# Patient Record
Sex: Female | Born: 1935
Health system: Southern US, Community
[De-identification: ages and names within clinical notes are randomized; demographics above are authoritative.]

## PROBLEM LIST (undated history)

## (undated) DIAGNOSIS — J439 Emphysema, unspecified: Secondary | ICD-10-CM

## (undated) DIAGNOSIS — Z9981 Dependence on supplemental oxygen: Secondary | ICD-10-CM

## (undated) DIAGNOSIS — E785 Hyperlipidemia, unspecified: Secondary | ICD-10-CM

## (undated) DIAGNOSIS — T7840XA Allergy, unspecified, initial encounter: Secondary | ICD-10-CM

## (undated) DIAGNOSIS — I10 Essential (primary) hypertension: Secondary | ICD-10-CM

## (undated) DIAGNOSIS — M810 Age-related osteoporosis without current pathological fracture: Secondary | ICD-10-CM

## (undated) DIAGNOSIS — H269 Unspecified cataract: Secondary | ICD-10-CM

## (undated) DIAGNOSIS — K219 Gastro-esophageal reflux disease without esophagitis: Secondary | ICD-10-CM

## (undated) HISTORY — DX: Essential (primary) hypertension: I10

## (undated) HISTORY — DX: Allergy, unspecified, initial encounter: T78.40XA

## (undated) HISTORY — DX: Hyperlipidemia, unspecified: E78.5

## (undated) HISTORY — DX: Emphysema, unspecified: J43.9

## (undated) HISTORY — DX: Gastro-esophageal reflux disease without esophagitis: K21.9

## (undated) HISTORY — DX: Unspecified cataract: H26.9

## (undated) HISTORY — PX: NO PAST SURGERIES: SHX2092

## (undated) HISTORY — DX: Dependence on supplemental oxygen: Z99.81

## (undated) HISTORY — DX: Age-related osteoporosis without current pathological fracture: M81.0

---

## 2014-11-14 LAB — VITAMIN D 25 HYDROXY (VIT D DEFICIENCY, FRACTURES): Vit D, 25-Hydroxy: 72

## 2015-12-22 DIAGNOSIS — Z822 Family history of deafness and hearing loss: Secondary | ICD-10-CM | POA: Diagnosis not present

## 2015-12-22 DIAGNOSIS — K219 Gastro-esophageal reflux disease without esophagitis: Secondary | ICD-10-CM | POA: Diagnosis not present

## 2015-12-22 DIAGNOSIS — I10 Essential (primary) hypertension: Secondary | ICD-10-CM | POA: Diagnosis not present

## 2015-12-22 DIAGNOSIS — R262 Difficulty in walking, not elsewhere classified: Secondary | ICD-10-CM | POA: Diagnosis not present

## 2015-12-22 DIAGNOSIS — J449 Chronic obstructive pulmonary disease, unspecified: Secondary | ICD-10-CM | POA: Diagnosis not present

## 2015-12-22 DIAGNOSIS — J302 Other seasonal allergic rhinitis: Secondary | ICD-10-CM | POA: Diagnosis not present

## 2015-12-22 DIAGNOSIS — R5381 Other malaise: Secondary | ICD-10-CM | POA: Diagnosis not present

## 2016-01-08 DIAGNOSIS — H900 Conductive hearing loss, bilateral: Secondary | ICD-10-CM | POA: Diagnosis not present

## 2016-01-08 DIAGNOSIS — H6123 Impacted cerumen, bilateral: Secondary | ICD-10-CM | POA: Diagnosis not present

## 2016-05-24 DIAGNOSIS — E785 Hyperlipidemia, unspecified: Secondary | ICD-10-CM | POA: Diagnosis not present

## 2016-05-24 DIAGNOSIS — I10 Essential (primary) hypertension: Secondary | ICD-10-CM | POA: Diagnosis not present

## 2016-05-24 DIAGNOSIS — Z79899 Other long term (current) drug therapy: Secondary | ICD-10-CM | POA: Diagnosis not present

## 2016-05-24 LAB — CBC AND DIFFERENTIAL
HCT: 43 (ref 36–46)
Hemoglobin: 14 (ref 12.0–16.0)
Neutrophils Absolute: 6860
Platelets: 311 (ref 150–399)
WBC: 9.8

## 2016-05-24 LAB — LIPID PANEL
Cholesterol: 204 — AB (ref 0–200)
HDL: 138 — AB (ref 35–70)
LDL Cholesterol: 51
Triglycerides: 76 (ref 40–160)

## 2016-05-24 LAB — HEPATIC FUNCTION PANEL
ALT: 32 (ref 7–35)
AST: 37 — AB (ref 13–35)
Alkaline Phosphatase: 81 (ref 25–125)
Bilirubin, Total: 0.8

## 2016-05-24 LAB — BASIC METABOLIC PANEL
BUN: 10 (ref 4–21)
Creatinine: 0.6 (ref 0.5–1.1)
Glucose: 100
Potassium: 4.6 (ref 3.4–5.3)
Sodium: 136 — AB (ref 137–147)

## 2016-09-27 DIAGNOSIS — K219 Gastro-esophageal reflux disease without esophagitis: Secondary | ICD-10-CM | POA: Diagnosis not present

## 2016-09-27 DIAGNOSIS — R262 Difficulty in walking, not elsewhere classified: Secondary | ICD-10-CM | POA: Diagnosis not present

## 2016-09-27 DIAGNOSIS — Z822 Family history of deafness and hearing loss: Secondary | ICD-10-CM | POA: Diagnosis not present

## 2016-09-27 DIAGNOSIS — J302 Other seasonal allergic rhinitis: Secondary | ICD-10-CM | POA: Diagnosis not present

## 2016-09-27 DIAGNOSIS — J449 Chronic obstructive pulmonary disease, unspecified: Secondary | ICD-10-CM | POA: Diagnosis not present

## 2016-09-27 DIAGNOSIS — I10 Essential (primary) hypertension: Secondary | ICD-10-CM | POA: Diagnosis not present

## 2016-09-27 DIAGNOSIS — Z23 Encounter for immunization: Secondary | ICD-10-CM | POA: Diagnosis not present

## 2016-09-27 DIAGNOSIS — R5381 Other malaise: Secondary | ICD-10-CM | POA: Diagnosis not present

## 2016-11-25 DIAGNOSIS — M810 Age-related osteoporosis without current pathological fracture: Secondary | ICD-10-CM | POA: Diagnosis not present

## 2016-11-25 DIAGNOSIS — I10 Essential (primary) hypertension: Secondary | ICD-10-CM | POA: Diagnosis not present

## 2016-11-25 DIAGNOSIS — E785 Hyperlipidemia, unspecified: Secondary | ICD-10-CM | POA: Diagnosis not present

## 2016-11-25 DIAGNOSIS — Z79899 Other long term (current) drug therapy: Secondary | ICD-10-CM | POA: Diagnosis not present

## 2016-11-27 DIAGNOSIS — J449 Chronic obstructive pulmonary disease, unspecified: Secondary | ICD-10-CM | POA: Diagnosis not present

## 2016-11-27 DIAGNOSIS — Z87891 Personal history of nicotine dependence: Secondary | ICD-10-CM | POA: Diagnosis not present

## 2016-11-27 DIAGNOSIS — Z79899 Other long term (current) drug therapy: Secondary | ICD-10-CM | POA: Diagnosis not present

## 2016-11-27 DIAGNOSIS — S61200A Unspecified open wound of right index finger without damage to nail, initial encounter: Secondary | ICD-10-CM | POA: Diagnosis not present

## 2016-11-27 DIAGNOSIS — M25562 Pain in left knee: Secondary | ICD-10-CM | POA: Diagnosis not present

## 2016-11-27 DIAGNOSIS — I10 Essential (primary) hypertension: Secondary | ICD-10-CM | POA: Diagnosis not present

## 2016-11-27 DIAGNOSIS — Z9981 Dependence on supplemental oxygen: Secondary | ICD-10-CM | POA: Diagnosis not present

## 2016-11-27 DIAGNOSIS — M25551 Pain in right hip: Secondary | ICD-10-CM | POA: Diagnosis not present

## 2016-11-27 DIAGNOSIS — W19XXXA Unspecified fall, initial encounter: Secondary | ICD-10-CM | POA: Diagnosis not present

## 2016-11-27 DIAGNOSIS — M25561 Pain in right knee: Secondary | ICD-10-CM | POA: Diagnosis not present

## 2017-05-16 DIAGNOSIS — I10 Essential (primary) hypertension: Secondary | ICD-10-CM | POA: Diagnosis not present

## 2017-05-16 DIAGNOSIS — E785 Hyperlipidemia, unspecified: Secondary | ICD-10-CM | POA: Diagnosis not present

## 2017-05-16 DIAGNOSIS — Z79899 Other long term (current) drug therapy: Secondary | ICD-10-CM | POA: Diagnosis not present

## 2017-06-27 ENCOUNTER — Ambulatory Visit (INDEPENDENT_AMBULATORY_CARE_PROVIDER_SITE_OTHER): Payer: Medicare Other | Admitting: Family Medicine

## 2017-06-27 ENCOUNTER — Encounter: Payer: Self-pay | Admitting: Family Medicine

## 2017-06-27 VITALS — BP 110/67 | HR 99 | Temp 98.0°F | Resp 20 | Ht 64.5 in | Wt 96.8 lb

## 2017-06-27 DIAGNOSIS — I10 Essential (primary) hypertension: Secondary | ICD-10-CM | POA: Insufficient documentation

## 2017-06-27 DIAGNOSIS — J439 Emphysema, unspecified: Secondary | ICD-10-CM | POA: Insufficient documentation

## 2017-06-27 DIAGNOSIS — Z9981 Dependence on supplemental oxygen: Secondary | ICD-10-CM | POA: Diagnosis not present

## 2017-06-27 DIAGNOSIS — E785 Hyperlipidemia, unspecified: Secondary | ICD-10-CM | POA: Diagnosis not present

## 2017-06-27 DIAGNOSIS — Z7689 Persons encountering health services in other specified circumstances: Secondary | ICD-10-CM | POA: Diagnosis not present

## 2017-06-27 DIAGNOSIS — J42 Unspecified chronic bronchitis: Secondary | ICD-10-CM

## 2017-06-27 MED ORDER — AMLODIPINE BESYLATE 5 MG PO TABS: 5.0000 mg | ORAL_TABLET | Freq: Every day | ORAL | 1 refills | Status: DC

## 2017-06-27 MED ORDER — IPRATROPIUM BROMIDE HFA 17 MCG/ACT IN AERS: 2.0000 | INHALATION_SPRAY | Freq: Two times a day (BID) | RESPIRATORY_TRACT | 5 refills | Status: DC

## 2017-06-27 MED ORDER — BUDESONIDE-FORMOTEROL FUMARATE 160-4.5 MCG/ACT IN AERO: 2.0000 | INHALATION_SPRAY | Freq: Two times a day (BID) | RESPIRATORY_TRACT | 5 refills | Status: DC

## 2017-06-27 MED ORDER — ATORVASTATIN CALCIUM 20 MG PO TABS: 20.0000 mg | ORAL_TABLET | Freq: Every day | ORAL | 1 refills | Status: DC

## 2017-06-27 NOTE — Patient Instructions (Signed)
It was a pleasure to meet you today.   Please ask prior PCP to fill out form for retirement community.  Fill the page I asked and return to me, IF I end up needing to fill it out for you, I will need you records first. I think it is in your best interest and time to try to have them do it.    I have refilled your medications in my name.   If your remain a patient here, we will need to see you every 6 months on chronic conditions.    Please help [place] help you:  We are honored you have chosen [facility] for your Primary Care home. Below you will find basic instructions that you may need to access in the future. Please help [place] help you by reading the instructions, which cover many of the frequent questions we experience.   Prescription refills and request:  -In order to allow more efficient response time, please call your pharmacy for all refills. They will forward the request electronically to [place]. This allows for the quickest possible response. Request left on a nurse line can take longer to refill, since these are checked as time allows between office patients and other phone calls.  - refill request can take up to 3-5 working days to complete.  - If request is sent electronically and request is appropiate, it is usually completed in 1-2 business days.  - all patients will need to be seen routinely for all chronic medical conditions requiring prescription medications (see follow-up below). If you are overdue for follow up on your condition, you will be asked to make an appointment and we will call in enough medication to cover you until your appointment (up to 30 days).  - all controlled substances will require a face to face visit to request/refill.  - if you desire your prescriptions to go through a new pharmacy, and have an active script at original pharmacy, you will need to call your pharmacy and have scripts transferred to new pharmacy. This is completed between the pharmacy locations  and not by your provider.    Results: If any images or labs were ordered, it can take up to 1 week to get results depending on the test ordered and the lab/facility running and resulting the test. - Normal or stable results, which do not need further discussion, may be released to your mychart immediately with attached note to you. A call may not be generated for normal results. Please make certain to sign up for mychart. If you have questions on how to activate your mychart you can call the front office.  - If your results need further discussion, our office will attempt to contact you via phone, and if unable to reach you after 2 attempts, we will release your abnormal result to your mychart with instructions.  - All results will be automatically released in mychart after 1 week.  - Your provider will provide you with explanation and instruction on all relevant material in your results. Please keep in mind, results and labs may appear confusing or abnormal to the untrained eye, but it does not mean they are actually abnormal for you personally. If you have any questions about your results that are not covered, or you desire more detailed explanation than what was provided, you should make an appointment with your provider to do so.   Our office handles many outgoing and incoming calls daily. If we have not contacted you  within 1 week about your results, please check your mychart to see if there is a message first and if not, then contact our office.  In helping with this matter, you help decrease call volume, and therefore allow [place] to be able to respond to patients needs more efficiently.   Acute office visits (sick visit):  An acute visit is intended for a new problem and are scheduled in shorter time slots to allow schedule openings for patients with new problems. This is the appropriate visit to discuss a new problem. In order to provide you with excellent quality medical care with proper time for  you to explain your problem, have an exam and receive treatment with instructions, these appointments should be limited to one new problem per visit. If you experience a new problem, in which you desire to be addressed, please make an acute office visit, we save openings on the schedule to accommodate you. Please do not save your new problem for any other type of visit, let us take care of it properly and quickly for you.   Follow up visits:  Depending on your condition(s) your provider will need to see you routinely in order to provide you with quality care and prescribe medication(s). Most chronic conditions (Example: hypertension, Diabetes, depression/anxiety... etc), require visits a couple times a year. Your provider will instruct you on proper follow up for your personal medical conditions and history. Please make certain to make follow up appointments for your condition as instructed. Failing to do so could result in lapse in your medication treatment/refills. If you request a refill, and are overdue to be seen on a condition, we will always provide you with a 30 day script (once) to allow you time to schedule.    Medicare wellness (well visit): - we have a wonderful Nurse Maudie Mercury), that will meet with you and provide you will yearly medicare wellness visits. These visits should occur yearly (can not be scheduled less than 1 calendar year apart) and cover preventive health, immunizations, advance directives and screenings you are entitled to yearly through your medicare benefits. Do not miss out on your entitled benefits, this is when medicare will pay for these benefits to be ordered for you.  These are strongly encouraged by your provider and is the appropriate type of visit to make certain you are up to date with all preventive health benefits. If you have not had your medicare wellness exam in the last 12 months, please make certain to schedule one by calling the office and schedule your medicare  wellness with Maudie Mercury as soon as possible.   Yearly physical (well visit):  - Adults are recommended to be seen yearly for physicals. Check with your insurance and date of your last physical, most insurances require one calendar year between physicals. Physicals include all preventive health topics, screenings, medical exam and labs that are appropriate for gender/age and history. You may have fasting labs needed at this visit. This is a well visit (not a sick visit), new problems should not be covered during this visit (see acute visit).  - Pediatric patients are seen more frequently when they are younger. Your provider will advise you on well child visit timing that is appropriate for your their age. - This is not a medicare wellness visit. Medicare wellness exams do not have an exam portion to the visit. Some medicare companies allow for a physical, some do not allow a yearly physical. If your medicare allows a yearly physical you  can schedule the medicare wellness with our nurse Maudie Mercury and have your physical with your provider after, on the same day. Please check with insurance for your full benefits.   Late Policy/No Shows:  - all new patients should arrive 15-30 minutes earlier than appointment to allow [place] time  to  obtain all personal demographics,  insurance information and for you to complete office paperwork. - All established patients should arrive 10-15 minutes earlier than appointment time to update all information and be checked in .  - In our best efforts to run on time, if you are late for your appointment you will be asked to either reschedule or if able, we will work you back into the schedule. There will be a wait time to work you back in the schedule,  depending on availability.  - If you are unable to make it to your appointment as scheduled, please call 24 hours ahead of time to allow [place] to fill the time slot with someone else who needs to be seen. If you do not cancel your appointment  ahead of time, you may be charged a no show fee.

## 2017-06-27 NOTE — Progress Notes (Signed)
Patient ID: Sheila Mcconnell, female  DOB: 07/17/1927, 83 y.o.   MRN: [id number] Patient Care Team    Relationship Specialty Notifications Start End  Ma Hillock, DO PCP - General Family Medicine  06/27/17     Chief Complaint  Patient presents with  . Establish Care    wants to be evaluated for retirement home placement    Subjective:  Sheila Mcconnell is a 83 y.o.  female present for new patient establishment. All past medical history, surgical history, allergies, family history, immunizations, medications and social history were Verbally obtained and entered in the electronic medical record today. There are no prior records to review for this patient.  Patient presents today for new patient establishment. She has been living and [facility] [place] in her home alone. Her primary care physician is Dr. Caryn Section. Patient is attempting to move closer to her daughter, and reside in a retirement home facility. She brings with her a form today asking of her medical history and evaluation of her ADLs. She is currently staying with her daughter for the past week.  She is prescribed medications for hypertension, hyperlipidemia and COPD/emphysema. She is on 24-hour oxygen supplementation of 2.5-3.5 L nasal cannula. Her oxygen is supplied by [facility](?), With routine visits every 3-4 months. She admits she doesn't go to the doctors that often. She states she feels she is fairly healthy.  Depression screen [facility] 2/9 06/27/2017  Decreased Interest 0  Down, Depressed, Hopeless 0  PHQ - 2 Score 0   No flowsheet data found.     Fall Risk  06/27/2017  Falls in the past year? No    There is no immunization history on file for this patient.  No exam data present  Past Medical History:  Diagnosis Date  . Allergy   . Cataracts, bilateral   . Dependence on supplemental oxygen    3.5l (pts stated 2.5 LNC), [facility] services  . Emphysema of lung ([place])   . Hyperlipidemia   . Hypertension    Allergies    Allergen Reactions  . Sulfa Antibiotics Nausea Only   Past Surgical History:  Procedure Laterality Date  . NO PAST SURGERIES     Family History  Problem Relation Age of Onset  . Diabetes Mother   . Hearing loss Mother   . Lymphoma Mother    Social History   Social History  . Marital status: Widowed    Spouse name: N/A  . Number of children: N/A  . Years of education: 49   Occupational History  . retired    Social History Main Topics  . Smoking status: Former Smoker    Packs/day: 1.00    Years: 50.00    Types: Cigarettes  . Smokeless tobacco: Never Used  . Alcohol use 1.2 oz/week    2 Glasses of wine per week  . Drug use: No  . Sexual activity: No   Other Topics Concern  . Not on file   Social History Narrative   Widow. Retired Network engineer. Some college.   Was living alone, now staying with her daughter for the past few weeks.   Drinks caffeine.   Wears her seatbelt. Smoke detector in the home   Wears a hearing aid.   Uses a scooter or wheelchair for distance.   Safe in her relationships.      Allergies as of 06/27/2017      Reactions   Sulfa Antibiotics Nausea Only      Medication List  Accurate as of 06/27/17  7:00 PM. Always use your most recent med list.          amLODipine 5 MG tablet Commonly known as:  NORVASC Take 1 tablet (5 mg total) by mouth daily.   atorvastatin 20 MG tablet Commonly known as:  LIPITOR Take 1 tablet (20 mg total) by mouth daily.   budesonide-formoterol 160-4.5 MCG/ACT inhaler Commonly known as:  SYMBICORT Inhale 2 puffs into the lungs 2 (two) times daily.   ipratropium 17 MCG/ACT inhaler Commonly known as:  ATROVENT HFA Inhale 2 puffs into the lungs 2 (two) times daily.       All past medical history, surgical history, allergies, family history, immunizations andmedications were updated in the EMR today and reviewed under the history and medication portions of their EMR.    No results found for this or any  previous visit (from the past 2160 hour(s)).  Patient was never admitted.   ROS: 14 pt review of systems performed and negative (unless mentioned in an HPI)  Objective: BP 110/67 (BP Location: Left Arm, Patient Position: Sitting, Cuff Size: Normal)   Pulse 99   Temp 98 F (36.7 C)   Resp 20   Ht 5' 4.5" (1.638 m)   Wt 96 lb 12 oz (43.9 kg)   SpO2 97% Comment: on O2  BMI 16.35 kg/m  Gen: Afebrile. No acute distress. Nontoxic in appearance, well-developed, well-nourished,  Pleasant Caucasian female sitting in a wheelchair on 2.5 L nasal cannula. Very thin. HENT: AT. [place]. Bilateral TM visualized and normal in appearance, normal external auditory canal. MMM, no oral lesions no Cough on exam, no hoarseness on exam. Eyes:Pupils Equal Round Reactive to light, Extraocular movements intact,  Conjunctiva without redness, discharge or icterus. Neck/lymp/endocrine: Supple CV: RRR no murmur, no edema, pulses equal bilateral lower extremity.  Chest: CTAB, no wheeze, rhonchi or crackles. Moderately diminished breath sounds. On 2.5 L nasal cannula. Abd: Soft. Thin. NTND. BS present.  Skin:  Warm and well-perfused. Skin intact. Neuro/Msk: In a wheelchair. PERLA. EOMi. Alert. Oriented x3.   Psych: Normal affect, dress and demeanor. Normal speech. Normal thought content and judgment.  Assessment/plan: Sheila Mcconnell is a 83 y.o. female present for new patient establishment. Chronic bronchitis, unspecified chronic bronchitis type ([place]) Oxygen dependent - Refill patient's medications today for her [facility]. - ipratropium (ATROVENT HFA) 17 MCG/ACT inhaler; Inhale 2 puffs into the lungs 2 (two) times daily.  Dispense: 1 Inhaler; Refill: 5 - budesonide-formoterol (SYMBICORT) 160-4.5 MCG/ACT inhaler; Inhale 2 puffs into the lungs 2 (two) times daily.  Dispense: 1 Inhaler; Refill: 5 - Patient has routine service for her oxygen supplementation through [facility].  - Uncertain if she has seen a  pulmonologist. - Follow-up 6 months  Essential hypertension/hyperlipidemia - Appears stable today. Continue amlodipine 5 mg daily. Continue Lipitor 20 mg daily Refills provided for her today for 6 months. - amLODipine (NORVASC) 5 MG tablet; Take 1 tablet (5 mg total) by mouth daily.  Dispense: 90 tablet; Refill: 1 - atorvastatin (LIPITOR) 20 MG tablet; Take 1 tablet (20 mg total) by mouth daily.  Dispense: 90 tablet; Refill: 1 - Follow-up 6 months  *Happy to take over patient's chronic conditions and establish her here today given she is moving closer to here. However, I am unable to fill out the requested form secondary to just meeting her today. I'm completely unfamiliar with her past medical history and there are no records provided to me today. I suggested they  attempt to call her primary care physician, Dr. Maralyn Sago office, and see if they would be willing to fill out her form since that office is familiar with her past medical history. If they are unwilling, after I received her records in full, we can attempt to fill out the forms for her. However this may take some time, and I would hate to see the bed at her desired living location be taken while we wait for records. I am also uncertain if this location has a in-house physician, therefore she actually would not even be establishing here.   Return in about 6 months (around 12/28/2017) for HTN'.   Note is dictated utilizing voice recognition software. Although note has been proof read prior to signing, occasional typographical errors still can be missed. If any questions arise, please do not hesitate to call for verification.  Electronically signed by: Howard Pouch, DO [place]

## 2017-07-13 ENCOUNTER — Encounter: Payer: Self-pay | Admitting: *Deleted

## 2017-08-21 ENCOUNTER — Encounter: Payer: Self-pay | Admitting: Family Medicine

## 2017-10-16 ENCOUNTER — Ambulatory Visit (HOSPITAL_BASED_OUTPATIENT_CLINIC_OR_DEPARTMENT_OTHER)
Admission: RE | Admit: 2017-10-16 | Discharge: 2017-10-16 | Disposition: A | Discharge status: Home / Self Care | Payer: Medicare Other | Source: Ambulatory Visit | Attending: Family Medicine | Admitting: Family Medicine

## 2017-10-16 ENCOUNTER — Ambulatory Visit (INDEPENDENT_AMBULATORY_CARE_PROVIDER_SITE_OTHER): Payer: Medicare Other | Admitting: Family Medicine

## 2017-10-16 ENCOUNTER — Encounter: Payer: Self-pay | Admitting: Family Medicine

## 2017-10-16 VITALS — BP 133/81 | HR 89 | Temp 97.4°F | Resp 24 | Wt 102.5 lb

## 2017-10-16 DIAGNOSIS — J449 Chronic obstructive pulmonary disease, unspecified: Secondary | ICD-10-CM | POA: Insufficient documentation

## 2017-10-16 DIAGNOSIS — J439 Emphysema, unspecified: Secondary | ICD-10-CM

## 2017-10-16 DIAGNOSIS — R059 Cough, unspecified: Secondary | ICD-10-CM

## 2017-10-16 DIAGNOSIS — R0602 Shortness of breath: Secondary | ICD-10-CM | POA: Diagnosis not present

## 2017-10-16 DIAGNOSIS — Z9981 Dependence on supplemental oxygen: Secondary | ICD-10-CM | POA: Diagnosis not present

## 2017-10-16 DIAGNOSIS — I10 Essential (primary) hypertension: Secondary | ICD-10-CM

## 2017-10-16 DIAGNOSIS — R05 Cough: Secondary | ICD-10-CM | POA: Diagnosis not present

## 2017-10-16 DIAGNOSIS — R42 Dizziness and giddiness: Secondary | ICD-10-CM

## 2017-10-16 MED ORDER — AMLODIPINE BESYLATE 5 MG PO TABS: 5.0000 mg | ORAL_TABLET | Freq: Every day | ORAL | 1 refills | Status: DC

## 2017-10-16 NOTE — Patient Instructions (Signed)
Restart spiriva and symbicort.  I will refer you to pulmonology   Collect CBC and CMP today and get an xray. We will call you with results once available.    Keep legs elevated if possible.

## 2017-10-16 NOTE — Progress Notes (Signed)
Sheila Mcconnell , August 22, 1927, 83 y.o., female MRN: [id number] Patient Care Team    Relationship Specialty Notifications Start End  Ma Hillock, DO PCP - General Family Medicine  06/27/17     Chief Complaint  Patient presents with  . COPD     Subjective: Pt presents for an OV with her daughter today, with question surrounding her inhaler use. Patient is on approximately 4 L of oxygen nasal cannula 24-7. She moved [facility] to be [facility] to her daughter, and needs a pulmonologist locally. She reports she saw a pulmonologist once that she can recall. She currently is prescribed Spiriva, Symbicort and Atrovent inhalers. She has a chronic cough. She endorses feeling more fatigued over the last couple weeks and new swelling of her ankles. She denied shortness of breath, fevers, dysuria, diarrhea or chills. She admits to dizziness that started today 1. Patient reports she takes her Symbicort 2 puffs 2 times a day. She uses her Atrovent 2 puffs 2 times a day. She has not been using her Spiriva, only because she didn't know she was supposed to.  [facility] supplies her oxygen.   Depression screen [facility] 2/9 06/27/2017  Decreased Interest 0  Down, Depressed, Hopeless 0  PHQ - 2 Score 0    Allergies  Allergen Reactions  . Sulfa Antibiotics Nausea Only   Social History   Tobacco Use  . Smoking status: Former Smoker    Packs/day: 1.00    Years: 50.00    Pack years: 50.00    Types: Cigarettes  . Smokeless tobacco: Never Used  Substance Use Topics  . Alcohol use: Yes    Alcohol/week: 1.2 oz    Types: 2 Glasses of wine per week   Past Medical History:  Diagnosis Date  . Allergy   . Cataracts, bilateral   . Dependence on supplemental oxygen    3.5l (pts stated 2.5 LNC), [facility] services  . Emphysema of lung ([place])   . GERD (gastroesophageal reflux disease)   . Hyperlipidemia   . Hypertension   . Osteoporosis    had been on Boniva   Past Surgical History:  Procedure Laterality Date  .  NO PAST SURGERIES     Family History  Problem Relation Age of Onset  . Diabetes Mother   . Hearing loss Mother   . Lymphoma Mother    Allergies as of 10/16/2017      Reactions   Sulfa Antibiotics Nausea Only      Medication List        Accurate as of 10/16/17  3:46 PM. Always use your most recent med list.          amLODipine 5 MG tablet Commonly known as:  NORVASC Take 1 tablet (5 mg total) by mouth daily.   atorvastatin 20 MG tablet Commonly known as:  LIPITOR Take 1 tablet (20 mg total) by mouth daily.   budesonide-formoterol 160-4.5 MCG/ACT inhaler Commonly known as:  SYMBICORT Inhale 2 puffs into the lungs 2 (two) times daily.   ipratropium 17 MCG/ACT inhaler Commonly known as:  ATROVENT HFA Inhale 2 puffs into the lungs 2 (two) times daily.       All past medical history, surgical history, allergies, family history, immunizations andmedications were updated in the EMR today and reviewed under the history and medication portions of their EMR.     ROS: Negative, with the exception of above mentioned in HPI   Objective:  BP 133/81 (BP Location: Left Arm, Patient Position:  Sitting, Cuff Size: Normal)   Pulse 89   Temp (!) 97.4 F (36.3 C)   Resp (!) 24   Wt 102 lb 8 oz (46.5 kg)   SpO2 92% Comment: [place] 5l  BMI 17.32 kg/m  Body mass index is 17.32 kg/m. Gen: Afebrile. No acute distress. Nontoxic in appearance, cachectic female.  HENT: AT. . MMM, no oral lesions. Bilateral nares without erythema or swelling. Throat without erythema or exudates. Cough present, no hoarseness. Eyes:Pupils Equal Round Reactive to light, Extraocular movements intact,  Conjunctiva without redness, discharge or icterus. Neck/lymp/endocrine: Supple, no lymphadenopathy CV: Irregularly irregular trace edema bilateral ankles. Chest: Diminished breath sounds bilaterally. Neuro: Hard of hearing. PERLA. EOMi. Alert. Oriented x3  Psych: Normal affect, dress and demeanor. Normal  speech. Normal thought content and judgment.  No exam data present No results found. No results found for this or any previous visit (from the past 24 hour(s)).  Assessment/Plan: Jalah [place]uth is a 83 y.o. female present for OV for  Oxygen dependent/Pulmonary emphysema, unspecified emphysema type ([place]) - Patient new to this provider. Initially patient reported 2.5-3.5 L of oxygen, she is on 4 L of nasal cannula today and her pulse ox is 92%, on establishment visit her oxygen level was 97%. Uncertain exactly when she stopped taking the Spiriva, on establishment she did report use of this medication. - Restart Spiriva once daily. Continue Symbicort 2 puffs twice a day. Atrovent use only when needed for acute illnesses. - Will obtain a chest x-ray today secondary to decreased oxygen saturations, increase oxygen supplement and symptoms of fatigue and dizziness. Treatment dependent upon lab and x-ray results. - DG Chest 2 View; Future, CBC, BMP - Ambulatory referral to Pulmonology  Essential hypertension Stable. Continue amlodipine 5 mg daily. Low-sodium. Continue Lipitor for now. Discontinue after current medication completed given age and last known lipid panel. Follow-up 6 month.  Dizziness/cough - Oxygen saturations are okay, but lower than prior. She is on more oxygen and reported her normal on establishment. She has trace edema in bilateral ankles. Will obtain chest x-ray today and likely treat as appropriate after results received. - CBC w/Diff - Basic Metabolic Panel (BMET)    Reviewed expectations re: course of current medical issues.  Discussed self-management of symptoms.  Outlined signs and symptoms indicating need for more acute intervention.  Patient verbalized understanding and all questions were answered.  Patient received an After-Visit Summary.    No orders of the defined types were placed in this encounter.    Note is dictated utilizing voice recognition  software. Although note has been proof read prior to signing, occasional typographical errors still can be missed. If any questions arise, please do not hesitate to call for verification.   electronically signed by:  Howard Pouch, DO  [place]

## 2017-10-17 ENCOUNTER — Other Ambulatory Visit: Payer: Self-pay | Admitting: Family Medicine

## 2017-10-17 LAB — BASIC METABOLIC PANEL
BUN: 7 mg/dL (ref 6–23)
CHLORIDE: 93 meq/L — AB (ref 96–112)
CO2: 34 meq/L — AB (ref 19–32)
CREATININE: 0.41 mg/dL (ref 0.40–1.20)
Calcium: 9.4 mg/dL (ref 8.4–10.5)
GFR: 155.47 mL/min (ref >60.00)
GLUCOSE: 122 mg/dL — AB (ref 70–99)
POTASSIUM: 4.4 meq/L (ref 3.5–5.1)
Sodium: 135 mEq/L (ref 135–145)

## 2017-10-17 LAB — CBC WITH DIFFERENTIAL/PLATELET
BASOS PCT: 0.6 % (ref 0.0–3.0)
Basophils Absolute: 0.1 10*3/uL (ref 0.0–0.1)
EOS PCT: 0.9 % (ref 0.0–5.0)
Eosinophils Absolute: 0.1 10*3/uL (ref 0.0–0.7)
HEMATOCRIT: 42.3 % (ref 36.0–46.0)
HEMOGLOBIN: 13.6 g/dL (ref 12.0–15.0)
LYMPHS PCT: 8.2 % — AB (ref 12.0–46.0)
Lymphs Abs: 0.7 10*3/uL (ref 0.7–4.0)
MCHC: 32.1 g/dL (ref 30.0–36.0)
MCV: 98.8 fl (ref 78.0–100.0)
Monocytes Absolute: 0.7 10*3/uL (ref 0.1–1.0)
Monocytes Relative: 7.7 % (ref 3.0–12.0)
NEUTROS PCT: 82.6 % — AB (ref 43.0–77.0)
Neutro Abs: 7.5 10*3/uL (ref 1.4–7.7)
PLATELETS: 249 10*3/uL (ref 150.0–400.0)
RBC: 4.29 Mil/uL (ref 3.87–5.11)
RDW: 12.8 % (ref 11.5–15.5)
WBC: 9.1 10*3/uL (ref 4.0–10.5)

## 2017-10-17 MED ORDER — AZITHROMYCIN 250 MG PO TABS: ORAL_TABLET | ORAL | 0 refills | Status: DC

## 2017-10-17 MED ORDER — PREDNISONE 20 MG PO TABS: 40.0000 mg | ORAL_TABLET | Freq: Every day | ORAL | 0 refills | Status: DC

## 2017-10-17 NOTE — Telephone Encounter (Addendum)
Please call pt daughter: Her cxr did not show evidence of PNA. I would recommned treating her as a COPD exacerbation given her exam.  - Her labs have not returned yet, we will call again once we receive them.  - We will call in prednisone (steroid burst) and Z-pack for her to start today, we need a local pharmacy to call it in (not one listed). - I would like her to keep her feet elevated when able. When we see her in 2 weeks we follow up on the ankle swelling and her lack of appetite as well, if not improved with treatment.  - follow up in 2 weeks, sooner if worsening. (30 min appt)

## 2017-10-18 ENCOUNTER — Telehealth: Payer: Self-pay | Admitting: Family Medicine

## 2017-10-18 NOTE — Telephone Encounter (Addendum)
Please call pt daughter: - Mrs. Amsler blood work resulted with a mild elevation in a white count. I suspect this is from a COPD exacerbation causing the changes we are seeing.  - I would also recommend she start an ensure or similar drink, two times a day to make certain she is receiving adequate calories and nutrition as soon as possible.  - I want to follow closely with her with a provider appt and retest blood work in 2 weeks. (30 minute follow up please, need to address multiple concerning issues)

## 2017-10-18 NOTE — Telephone Encounter (Signed)
Left message for patient daughter to call back.

## 2017-10-18 NOTE — Telephone Encounter (Signed)
Spoke with patient' daughter reviewed lab results and instructions. Patient's daughter verbalized understanding. Patient scheduled for follow up appt.

## 2017-11-09 ENCOUNTER — Ambulatory Visit (INDEPENDENT_AMBULATORY_CARE_PROVIDER_SITE_OTHER): Payer: Medicare Other | Admitting: Pulmonary Disease

## 2017-11-09 ENCOUNTER — Encounter: Payer: Self-pay | Admitting: Pulmonary Disease

## 2017-11-09 ENCOUNTER — Ambulatory Visit: Payer: Medicare Other | Admitting: Family Medicine

## 2017-11-09 VITALS — BP 155/83 | HR 111 | Ht 64.5 in | Wt 102.0 lb

## 2017-11-09 DIAGNOSIS — J9611 Chronic respiratory failure with hypoxia: Secondary | ICD-10-CM

## 2017-11-09 DIAGNOSIS — J432 Centrilobular emphysema: Secondary | ICD-10-CM | POA: Diagnosis not present

## 2017-11-09 DIAGNOSIS — R29898 Other symptoms and signs involving the musculoskeletal system: Secondary | ICD-10-CM

## 2017-11-09 DIAGNOSIS — M25473 Effusion, unspecified ankle: Secondary | ICD-10-CM

## 2017-11-09 NOTE — Progress Notes (Signed)
Subjective:   PATIENT ID: Sheila Mcconnell GENDER: female DOB: 06-16-1927, MRN: [id number]  Synopsis: Referred in Dec 2018 for COPD  HPI  Chief Complaint  Patient presents with  . Pulm Consult    Referred by Dr. Raoul Pitch for chronic cough. Non productive,    [place] just relocated to the area.    She has COPD and she wants to establish care here. > She was diagnosed around 1995 > she has never been hospitalized for her COPD > she doesn't recall many flares of COPD over the years > her daughter wanted her to see [place] for evaluation of her COPD > Dr. Raoul Pitch recently prescribed prednsone and azithromycin > she uses Symbicort   Dyspnea: > she gets shortness of breath with exertion > she can walk on level ground, can go about 50-75 feet > she denies pain when she is short of breath > she doesn't wheeze > no chest congestion > hasn't changed much in the last year > she doesn't clean the house anymore  Chronic respiratory failure with hypoxemia: > has been on oxygen since 1995  Cough: > occasional dry cough  She had pneumonia one time that she can think of.  Tobacco 1ppd 44 quit 1995    Past Medical History:  Diagnosis Date  . Allergy   . Cataracts, bilateral   . Dependence on supplemental oxygen    3.5l (pts stated 2.5 LNC), [facility]  . Emphysema of lung ([place])   . GERD (gastroesophageal reflux disease)   . Hyperlipidemia   . Hypertension   . Osteoporosis    had been on Boniva     Family History  Problem Relation Age of Onset  . Diabetes Mother   . Hearing loss Mother   . Lymphoma Mother      Social History   Socioeconomic History  . Marital status: Widowed    Spouse name: Not on file  . Number of children: Not on file  . Years of education: 34  . Highest education level: Not on file  Social Needs  . Financial resource strain: Not on file  . Food insecurity - worry: Not on file  . Food insecurity - inability: Not on file  . Transportation needs  - medical: Not on file  . Transportation needs - non-medical: Not on file  Occupational History  . Occupation: retired  Tobacco Use  . Smoking status: Former Smoker    Packs/day: 1.00    Years: 50.00    Pack years: 50.00    Types: Cigarettes  . Smokeless tobacco: Never Used  Substance and Sexual Activity  . Alcohol use: Yes    Alcohol/week: 1.2 oz    Types: 2 Glasses of wine per week  . Drug use: No  . Sexual activity: No  Other Topics Concern  . Not on file  Social History Narrative   Widow. Retired Network engineer. Some college.   Was living alone, now staying with her daughter for the past few weeks.   Drinks caffeine.   Wears her seatbelt. Smoke detector in the home   Wears a hearing aid.   Uses a scooter or wheelchair for distance.   Safe in her relationships.     Allergies  Allergen Reactions  . Sulfa Antibiotics Nausea Only     Outpatient Medications Prior to Visit  Medication Sig Dispense Refill  . amLODipine (NORVASC) 5 MG tablet Take 1 tablet (5 mg total) daily by mouth. 90 tablet 1  . azithromycin (  ZITHROMAX) 250 MG tablet 500 mg day 1, then 250 mg QD 6 tablet 0  . budesonide-formoterol (SYMBICORT) 160-4.5 MCG/ACT inhaler Inhale 2 puffs into the lungs 2 (two) times daily. 1 Inhaler 5  . ipratropium (ATROVENT HFA) 17 MCG/ACT inhaler Inhale 2 puffs into the lungs 2 (two) times daily. (Patient taking differently: Inhale 2 puffs every 6 (six) hours as needed into the lungs. ) 1 Inhaler 5  . tiotropium (SPIRIVA) 18 MCG inhalation capsule Place 18 mcg daily into inhaler and inhale.    . predniSONE (DELTASONE) 20 MG tablet Take 2 tablets (40 mg total) daily with breakfast by mouth. 10 tablet 0   No facility-administered medications prior to visit.     Review of Systems  Constitutional: Negative for chills, fever, malaise/fatigue and weight loss.  HENT: Negative for congestion, nosebleeds, sinus pain and sore throat.   Eyes: Negative for photophobia, pain and discharge.    Respiratory: Positive for cough and shortness of breath. Negative for hemoptysis, sputum production and wheezing.   Cardiovascular: Negative for chest pain, palpitations, orthopnea and leg swelling.  Gastrointestinal: Negative for abdominal pain, constipation, diarrhea, nausea and vomiting.  Genitourinary: Negative for dysuria, frequency, hematuria and urgency.  Musculoskeletal: Negative for back pain, joint pain, myalgias and neck pain.  Skin: Negative for itching and rash.  Neurological: Negative for tingling, tremors, sensory change, speech change, focal weakness, seizures, weakness and headaches.  Psychiatric/Behavioral: Negative for memory loss, substance abuse and suicidal ideas. The patient is not nervous/anxious.   All other systems reviewed and are negative.     Objective:  Physical Exam   Vitals:   11/09/17 1356  BP: (!) 155/83  Pulse: (!) 111  SpO2: 92%  Weight: 102 lb (46.3 kg)  Height: 5' 4.5" (1.638 m)    Gen: chronically ill appearing, no acute distress HENT: NCAT, OP clear, neck supple without masses Eyes: PERRL, EOMi Lymph: no cervical lymphadenopathy PULM: CTA B CV: RRR, no mgr, no JVD GI: BS+, soft, nontender, no hsm Derm: no rash or skin breakdown MSK: normal bulk and tone Neuro: A&Ox4, CN II-XII intact, strength 5/5 in all 4 extremities Psyche: normal mood and affect   CBC    Component Value Date/Time   WBC 9.1 10/16/2017 1612   RBC 4.29 10/16/2017 1612   HGB 13.6 10/16/2017 1612   HCT 42.3 10/16/2017 1612   PLT 249.0 10/16/2017 1612   MCV 98.8 10/16/2017 1612   MCHC 32.1 10/16/2017 1612   RDW 12.8 10/16/2017 1612   LYMPHSABS 0.7 10/16/2017 1612   MONOABS 0.7 10/16/2017 1612   EOSABS 0.1 10/16/2017 1612   BASOSABS 0.1 10/16/2017 1612     Chest imaging: 10-2017 CXR images reviewed showing emphysema and granulomatous changes  PFT:  Labs:  Path:  Echo:  Heart Catheterization:  10/2017 PCP notes reviewed where she was seen for  COPD, O2 set at 4L      Assessment & Plan:   Chronic respiratory failure with hypoxia (HCC)  Centrilobular emphysema (HCC)  Muscular deconditioning  Ankle swelling, unspecified laterality  Discussion: Travonna presents to clinic today for evaluation and to establish care for centrilobular emphysema chronic respiratory failure.  She appears profoundly deconditioned and somewhat cachectic.  However, it is remarkable that she is made at age 63 carrying a diagnosis of COPD for the last 33 years.  I believe this is because she has a phenotype of someone who does not have frequent exacerbations.  She predominantly has emphysema as seen on her chest x-ray  today.  I think she would benefit from exercising regularly because she is so deconditioned.  She says that adding Symbicort recently did not make much of a difference.  Plan: Ankle swelling: We will check a blood test called a brain natruretic peptide to make sure there is no evidence of your body holding on to fluid because of heart failure  Chronic respiratory failure with hypoxemia: Keep using oxygen 24 hours a day We will check your O2 saturation while you are walking on a day to make sure we have the dose right  Centrilobular emphysema/COPD: We will check a spirometry test today Take Spiriva daily Take Symbicort daily Exercise regularly: I recommend 2-3 minutes of walking a day for a week, then increase to 5 minutes of walking a day.  Gradually increase over time.  Try to stand from a seated position 10 times a day  Physical deconditioning: Try exercising as above  We will see you back in about 6 weeks to see how things are going, if you are not having much improvement in the shortness of breath then we could likely go back to just using Spiriva daily rather than adding the Symbicort.    Current Outpatient Medications:  .  amLODipine (NORVASC) 5 MG tablet, Take 1 tablet (5 mg total) daily by mouth., Disp: 90 tablet, Rfl: 1 .   azithromycin (ZITHROMAX) 250 MG tablet, 500 mg day 1, then 250 mg QD, Disp: 6 tablet, Rfl: 0 .  budesonide-formoterol (SYMBICORT) 160-4.5 MCG/ACT inhaler, Inhale 2 puffs into the lungs 2 (two) times daily., Disp: 1 Inhaler, Rfl: 5 .  ipratropium (ATROVENT HFA) 17 MCG/ACT inhaler, Inhale 2 puffs into the lungs 2 (two) times daily. (Patient taking differently: Inhale 2 puffs every 6 (six) hours as needed into the lungs. ), Disp: 1 Inhaler, Rfl: 5 .  tiotropium (SPIRIVA) 18 MCG inhalation capsule, Place 18 mcg daily into inhaler and inhale., Disp: , Rfl:

## 2017-11-09 NOTE — Patient Instructions (Signed)
Ankle swelling: We will check a blood test called a brain natruretic peptide to make sure there is no evidence of your body holding on to fluid because of heart failure  Chronic respiratory failure with hypoxemia: Keep using oxygen 24 hours a day We will check your O2 saturation while you are walking on a day to make sure we have the dose right  Centrilobular emphysema/COPD: We will check a spirometry test today Take Spiriva daily Take Symbicort daily Exercise regularly: I recommend 2-3 minutes of walking a day for a week, then increase to 5 minutes of walking a day.  Gradually increase over time.  Try to stand from a seated position 10 times a day  Physical deconditioning: Try exercising as above  We will see you back in about 6 weeks to see how things are going, if you are not having much improvement in the shortness of breath then we could likely go back to just using Spiriva daily rather than adding the Symbicort.

## 2017-11-10 LAB — BRAIN NATRIURETIC PEPTIDE: BNP: 10.4 pg/mL (ref 0.0–100.0)

## 2017-12-03 DIAGNOSIS — J441 Chronic obstructive pulmonary disease with (acute) exacerbation: Secondary | ICD-10-CM | POA: Diagnosis not present

## 2017-12-03 DIAGNOSIS — J029 Acute pharyngitis, unspecified: Secondary | ICD-10-CM | POA: Diagnosis not present

## 2017-12-28 ENCOUNTER — Ambulatory Visit (INDEPENDENT_AMBULATORY_CARE_PROVIDER_SITE_OTHER): Payer: Medicare HMO | Admitting: Pulmonary Disease

## 2017-12-28 ENCOUNTER — Encounter: Payer: Self-pay | Admitting: Pulmonary Disease

## 2017-12-28 VITALS — BP 118/75 | HR 79 | Ht 65.0 in | Wt 103.0 lb

## 2017-12-28 DIAGNOSIS — J432 Centrilobular emphysema: Secondary | ICD-10-CM | POA: Diagnosis not present

## 2017-12-28 DIAGNOSIS — J9611 Chronic respiratory failure with hypoxia: Secondary | ICD-10-CM

## 2017-12-28 NOTE — Patient Instructions (Signed)
Centrilobular emphysema: Continue Symbicort and Spiriva as you are doing Exercise as much as possible Practice good hand hygiene this time a year  Chronic respiratory failure with hypoxemia: Continue using 2-1/2 L of oxygen continuously  We will see you back in 6 months or sooner if needed

## 2017-12-28 NOTE — Progress Notes (Signed)
Subjective:   PATIENT ID: Sheila Mcconnell GENDER: female DOB: Dec 20, 1926, MRN: [id number]  Synopsis: Referred in Dec 2018 for COPD  HPI  Chief Complaint  Patient presents with  . Follow-up    Pt is on 2.5 liters of O2 con't, doing well over all since last OV   Nyah says that her breathing has been stable since the last visit.  She continues to have some shortness of breath after she walks for about 10 minutes at a time but it is not worsened.  She denies cough.  She did have a cold over the holidays and she needed to take an antibiotic.  However she denied coughing up mucus or increasing shortness of breath.  She has not been using increasing doses of albuterol recently.  She continues to use and benefit from 2-1/2 L of oxygen continuously.   Past Medical History:  Diagnosis Date  . Allergy   . Cataracts, bilateral   . Dependence on supplemental oxygen    3.5l (pts stated 2.5 LNC), [facility]  . Emphysema of lung ([place])   . GERD (gastroesophageal reflux disease)   . Hyperlipidemia   . Hypertension   . Osteoporosis    had been on Boniva     Family History  Problem Relation Age of Onset  . Diabetes Mother   . Hearing loss Mother   . Lymphoma Mother      Review of Systems  Constitutional: Negative for chills, fever, malaise/fatigue and weight loss.  HENT: Negative for congestion, nosebleeds, sinus pain and sore throat.   Eyes: Negative for photophobia, pain and discharge.  Respiratory: Positive for cough and shortness of breath. Negative for hemoptysis, sputum production and wheezing.   Cardiovascular: Negative for chest pain, palpitations, orthopnea and leg swelling.  Gastrointestinal: Negative for abdominal pain, constipation, diarrhea, nausea and vomiting.  Genitourinary: Negative for dysuria, frequency, hematuria and urgency.  Musculoskeletal: Negative for back pain, joint pain, myalgias and neck pain.  Skin: Negative for itching and rash.  Neurological:  Negative for tingling, tremors, sensory change, speech change, focal weakness, seizures, weakness and headaches.  Psychiatric/Behavioral: Negative for memory loss, substance abuse and suicidal ideas. The patient is not nervous/anxious.   All other systems reviewed and are negative.     Objective:  Physical Exam   Vitals:   12/28/17 1639  BP: 118/75  Pulse: 79  SpO2: 99%  Weight: 103 lb (46.7 kg)  Height: 5\' 5"  (1.651 m)    Gen: chronically ill appearing HENT: OP clear, TM's clear, neck supple PULM: Poor air movement B, normal percussion CV: RRR, no mgr, trace edema GI: BS+, soft, nontender Derm: no cyanosis or rash Psyche: normal mood and affect    CBC    Component Value Date/Time   WBC 9.1 10/16/2017 1612   RBC 4.29 10/16/2017 1612   HGB 13.6 10/16/2017 1612   HCT 42.3 10/16/2017 1612   PLT 249.0 10/16/2017 1612   MCV 98.8 10/16/2017 1612   MCHC 32.1 10/16/2017 1612   RDW 12.8 10/16/2017 1612   LYMPHSABS 0.7 10/16/2017 1612   MONOABS 0.7 10/16/2017 1612   EOSABS 0.1 10/16/2017 1612   BASOSABS 0.1 10/16/2017 1612     Chest imaging: 10-2017 CXR images reviewed showing emphysema and granulomatous changes  PFT:  Labs:  Path:  Echo:  Heart Catheterization:  10/2017 PCP notes reviewed where she was seen for COPD, O2 set at 4L      Assessment & Plan:   Chronic respiratory failure  with hypoxia ([place])  Centrilobular emphysema ([place])  Discussion: This is been a stable interval for [place].  She has severe centrilobular emphysema and severe symptoms but she has been stable otherwise.  She had a sinus infection which was treated with antibiotics but it does not sound as if this was a COPD exacerbation.  I told her today that if she wanted to stop Spiriva she could, though she says she has no problems from it so she would prefer to continue.  Plan: Centrilobular emphysema: Continue Symbicort and Spiriva as you are doing Exercise as much as possible Practice  good hand hygiene this time a year  Chronic respiratory failure with hypoxemia: Continue using 2-1/2 L of oxygen continuously  We will see you back in 6 months or sooner if needed   Current Outpatient Medications:  .  amLODipine (NORVASC) 5 MG tablet, Take 1 tablet (5 mg total) daily by mouth., Disp: 90 tablet, Rfl: 1 .  budesonide-formoterol (SYMBICORT) 160-4.5 MCG/ACT inhaler, Inhale 2 puffs into the lungs 2 (two) times daily., Disp: 1 Inhaler, Rfl: 5 .  ipratropium (ATROVENT HFA) 17 MCG/ACT inhaler, Inhale 2 puffs into the lungs 2 (two) times daily. (Patient taking differently: Inhale 2 puffs every 6 (six) hours as needed into the lungs. ), Disp: 1 Inhaler, Rfl: 5 .  tiotropium (SPIRIVA) 18 MCG inhalation capsule, Place 18 mcg daily into inhaler and inhale., Disp: , Rfl:

## 2018-02-15 ENCOUNTER — Other Ambulatory Visit: Payer: Self-pay | Admitting: Family Medicine

## 2018-02-26 DIAGNOSIS — D485 Neoplasm of uncertain behavior of skin: Secondary | ICD-10-CM | POA: Diagnosis not present

## 2018-02-26 DIAGNOSIS — D229 Melanocytic nevi, unspecified: Secondary | ICD-10-CM | POA: Diagnosis not present

## 2018-02-26 DIAGNOSIS — L821 Other seborrheic keratosis: Secondary | ICD-10-CM | POA: Diagnosis not present

## 2018-04-10 ENCOUNTER — Telehealth: Payer: Self-pay | Admitting: Pulmonary Disease

## 2018-04-10 NOTE — Telephone Encounter (Signed)
Called and spoke with patients daughter, she is requesting a refill on patients spiriva. It was not initially prescribed by BQ but the physician who prescribed it is no longer seeing the patient.   Patient has enough for this week. BQ are you ok with refilling this? Thanks.

## 2018-04-10 NOTE — Telephone Encounter (Signed)
Attempted to call pt's daughter, [name]. I did not receive an answer. I have left a message for [name] to return our call.

## 2018-04-10 NOTE — Telephone Encounter (Signed)
Pt daughter [name] is calling back [phone]

## 2018-04-11 MED ORDER — TIOTROPIUM BROMIDE MONOHYDRATE 18 MCG IN CAPS: 18.0000 ug | ORAL_CAPSULE | Freq: Every day | RESPIRATORY_TRACT | 3 refills | Status: DC

## 2018-04-11 NOTE — Telephone Encounter (Signed)
Spoke with pt's daughter, [name]. She is aware that this prescription has been sent in. Nothing further was needed.

## 2018-04-11 NOTE — Telephone Encounter (Signed)
Yes, 11 refills

## 2018-04-14 ENCOUNTER — Emergency Department (HOSPITAL_BASED_OUTPATIENT_CLINIC_OR_DEPARTMENT_OTHER)
Admission: EM | Admit: 2018-04-14 | Discharge: 2018-04-14 | Disposition: A | Discharge status: Home / Self Care | Payer: Medicare HMO | Attending: Emergency Medicine | Admitting: Emergency Medicine

## 2018-04-14 ENCOUNTER — Encounter (HOSPITAL_BASED_OUTPATIENT_CLINIC_OR_DEPARTMENT_OTHER): Payer: Self-pay | Admitting: Emergency Medicine

## 2018-04-14 ENCOUNTER — Other Ambulatory Visit: Payer: Self-pay

## 2018-04-14 ENCOUNTER — Emergency Department (HOSPITAL_BASED_OUTPATIENT_CLINIC_OR_DEPARTMENT_OTHER): Payer: Medicare HMO

## 2018-04-14 DIAGNOSIS — R0602 Shortness of breath: Secondary | ICD-10-CM | POA: Diagnosis present

## 2018-04-14 DIAGNOSIS — Z87891 Personal history of nicotine dependence: Secondary | ICD-10-CM | POA: Insufficient documentation

## 2018-04-14 DIAGNOSIS — Z9981 Dependence on supplemental oxygen: Secondary | ICD-10-CM | POA: Diagnosis not present

## 2018-04-14 DIAGNOSIS — Z79899 Other long term (current) drug therapy: Secondary | ICD-10-CM | POA: Insufficient documentation

## 2018-04-14 DIAGNOSIS — J441 Chronic obstructive pulmonary disease with (acute) exacerbation: Secondary | ICD-10-CM

## 2018-04-14 DIAGNOSIS — I10 Essential (primary) hypertension: Secondary | ICD-10-CM | POA: Insufficient documentation

## 2018-04-14 LAB — COMPREHENSIVE METABOLIC PANEL
ALK PHOS: 119 U/L (ref 38–126)
ALT: 14 U/L (ref 14–54)
AST: 21 U/L (ref 15–41)
Albumin: 4.2 g/dL (ref 3.5–5.0)
Anion gap: 11 (ref 5–15)
BILIRUBIN TOTAL: 0.6 mg/dL (ref 0.3–1.2)
BUN: 11 mg/dL (ref 6–20)
CALCIUM: 9.2 mg/dL (ref 8.9–10.3)
CHLORIDE: 93 mmol/L — AB (ref 101–111)
CO2: 29 mmol/L (ref 22–32)
CREATININE: 0.5 mg/dL (ref 0.44–1.00)
GFR calc Af Amer: >60 mL/min (ref >60)
Glucose, Bld: 120 mg/dL — ABNORMAL HIGH (ref 65–99)
Potassium: 4.4 mmol/L (ref 3.5–5.1)
Sodium: 133 mmol/L — ABNORMAL LOW (ref 135–145)
TOTAL PROTEIN: 7.6 g/dL (ref 6.5–8.1)

## 2018-04-14 LAB — I-STAT VENOUS BLOOD GAS, ED
Acid-Base Excess: 5 mmol/L — ABNORMAL HIGH (ref 0.0–2.0)
BICARBONATE: 34.3 mmol/L — AB (ref 20.0–28.0)
O2 Saturation: 66 %
Patient temperature: 98.2
TCO2: 36 mmol/L — AB (ref 22–32)
pCO2, Ven: 71 mmHg (ref 44.0–60.0)
pH, Ven: 7.291 (ref 7.250–7.430)
pO2, Ven: 39 mmHg (ref 32.0–45.0)

## 2018-04-14 LAB — CBC WITH DIFFERENTIAL/PLATELET
BASOS ABS: 0 10*3/uL (ref 0.0–0.1)
BASOS PCT: 0 %
EOS PCT: 2 %
Eosinophils Absolute: 0.2 10*3/uL (ref 0.0–0.7)
HCT: 42.7 % (ref 36.0–46.0)
Hemoglobin: 13.9 g/dL (ref 12.0–15.0)
Lymphocytes Relative: 13 %
Lymphs Abs: 1.3 10*3/uL (ref 0.7–4.0)
MCH: 32.3 pg (ref 26.0–34.0)
MCHC: 32.6 g/dL (ref 30.0–36.0)
MCV: 99.3 fL (ref 78.0–100.0)
MONO ABS: 1.2 10*3/uL — AB (ref 0.1–1.0)
Monocytes Relative: 11 %
Neutro Abs: 7.6 10*3/uL (ref 1.7–7.7)
Neutrophils Relative %: 74 %
PLATELETS: 295 10*3/uL (ref 150–400)
RBC: 4.3 MIL/uL (ref 3.87–5.11)
RDW: 12.2 % (ref 11.5–15.5)
WBC: 10.3 10*3/uL (ref 4.0–10.5)

## 2018-04-14 LAB — TROPONIN I

## 2018-04-14 MED ORDER — ALBUTEROL SULFATE (2.5 MG/3ML) 0.083% IN NEBU
5.0000 mg | INHALATION_SOLUTION | Freq: Once | RESPIRATORY_TRACT | Status: AC
  Administered 2018-04-14: 5 mg via RESPIRATORY_TRACT
  Filled 2018-04-14: qty 6

## 2018-04-14 MED ORDER — SODIUM CHLORIDE 0.9 % IV BOLUS
1000.0000 mL | Freq: Once | INTRAVENOUS | Status: AC
  Administered 2018-04-14: 1000 mL via INTRAVENOUS

## 2018-04-14 MED ORDER — MAGNESIUM SULFATE 2 GM/50ML IV SOLN
2.0000 g | Freq: Once | INTRAVENOUS | Status: AC
  Administered 2018-04-14: 2 g via INTRAVENOUS
  Filled 2018-04-14: qty 50

## 2018-04-14 MED ORDER — PREDNISONE 50 MG PO TABS: 50.0000 mg | ORAL_TABLET | Freq: Every day | ORAL | 0 refills | Status: DC

## 2018-04-14 MED ORDER — IPRATROPIUM BROMIDE 0.02 % IN SOLN
0.5000 mg | Freq: Once | RESPIRATORY_TRACT | Status: AC
  Administered 2018-04-14: 0.5 mg via RESPIRATORY_TRACT
  Filled 2018-04-14: qty 2.5

## 2018-04-14 MED ORDER — METHYLPREDNISOLONE SODIUM SUCC 125 MG IJ SOLR
125.0000 mg | Freq: Once | INTRAMUSCULAR | Status: AC
  Administered 2018-04-14: 125 mg via INTRAVENOUS
  Filled 2018-04-14: qty 2

## 2018-04-14 NOTE — ED Provider Notes (Addendum)
[facility] EMERGENCY DEPARTMENT Provider Note   CSN: [id number] Arrival date & time: 04/14/18  2022   History   Chief Complaint Chief Complaint  Patient presents with  . Shortness of Breath    HPI Sheila Mcconnell is a 84 y.o. female with a PMH of COPD (on O2 at home, but patient does not know how much), HTN, HLD, and osteoporosis presenting to the ED with shortness of breath that started this morning. She has been feeling more tired for the last few months. This morning, she was at her daughter's house when her daughter noticed that she was becoming very winded with just walking a few steps. This is unusual for her. She typically has some shortness of breath with ambulation, but this was much more severe than normal. She has not had any recent cough, fevers, or chills.  Past Medical History:  Diagnosis Date  . Allergy   . Cataracts, bilateral   . Dependence on supplemental oxygen    3.5l (pts stated 2.5 LNC), [facility]  . Emphysema of lung ([place])   . GERD (gastroesophageal reflux disease)   . Hyperlipidemia   . Hypertension   . Osteoporosis    had been on Boniva    Patient Active Problem List   Diagnosis Date Noted  . Emphysema lung ([place]) 06/27/2017  . Oxygen dependent 06/27/2017  . Hypertension 06/27/2017    Past Surgical History:  Procedure Laterality Date  . NO PAST SURGERIES       OB History    Gravida  4   Para  4   Term      Preterm      AB      Living  4     SAB      TAB      Ectopic      Multiple      Live Births               Home Medications    Prior to Admission medications   Medication Sig Start Date End Date Taking? Authorizing Provider  amLODipine (NORVASC) 5 MG tablet Take 1 tablet (5 mg total) daily by mouth. 10/16/17   Kuneff, Renee A, DO  budesonide-formoterol (SYMBICORT) 160-4.5 MCG/ACT inhaler Inhale 2 puffs into the lungs 2 (two) times daily. 06/27/17   Kuneff, Renee A, DO  ipratropium (ATROVENT HFA) 17  MCG/ACT inhaler Inhale 2 puffs into the lungs 2 (two) times daily. Patient taking differently: Inhale 2 puffs every 6 (six) hours as needed into the lungs.  06/27/17   Kuneff, Renee A, DO  tiotropium (SPIRIVA) 18 MCG inhalation capsule Place 1 capsule (18 mcg total) into inhaler and inhale daily. 04/11/18   Juanito Doom, MD    Family History Family History  Problem Relation Age of Onset  . Diabetes Mother   . Hearing loss Mother   . Lymphoma Mother     Social History Social History   Tobacco Use  . Smoking status: Former Smoker    Packs/day: 1.00    Years: 50.00    Pack years: 50.00    Types: Cigarettes  . Smokeless tobacco: Never Used  Substance Use Topics  . Alcohol use: Yes    Alcohol/week: 1.2 oz    Types: 2 Glasses of wine per week  . Drug use: No     Allergies   Sulfa antibiotics   Review of Systems Review of Systems  Constitutional: Negative for chills and fever.  HENT: Negative for  congestion and rhinorrhea.   Respiratory: Positive for shortness of breath. Negative for cough.   Cardiovascular: Negative for chest pain and leg swelling.  Gastrointestinal: Negative for nausea and vomiting.  Genitourinary: Negative for dysuria and frequency.  Musculoskeletal: Negative for joint swelling and myalgias.  Neurological: Negative for dizziness and headaches.  Psychiatric/Behavioral: Negative for confusion.    Physical Exam Updated Vital Signs BP (!) 152/96 (BP Location: Left Arm)   Pulse (!) 116   Temp 98.2 F (36.8 C) (Oral)   Resp (!) 24   Ht 5' 5.5" (1.664 m)   Wt 46.7 kg (103 lb)   SpO2 96%   BMI 16.88 kg/m   Physical Exam  Constitutional: She is oriented to person, place, and time.  Thin, chronically ill-appearing elderly woman  HENT:  Head: Normocephalic and atraumatic.  Mouth/Throat: Oropharynx is clear and moist.  Eyes: Pupils are equal, round, and reactive to light. EOM are normal.  Neck: Normal range of motion. Neck supple. No JVD  present. No thyromegaly present.  Cardiovascular: Regular rhythm.  No murmur heard. Tachycardic   Pulmonary/Chest:  Increased work of breathing with accessory muscle use, diminished lung sounds throughout all lung fields, no wheezing, no crackles  Abdominal: Soft. Bowel sounds are normal. She exhibits no distension. There is no tenderness. There is no rebound and no guarding.  Musculoskeletal:       Right lower leg: She exhibits no edema.       Left lower leg: She exhibits no edema.  Neurological: She is alert and oriented to person, place, and time.  Skin: Skin is warm and dry. No rash noted.  Psychiatric: She has a normal mood and affect. Her behavior is normal.    ED Treatments / Results  Labs (all labs ordered are listed, but only abnormal results are displayed) Labs Reviewed  CBC WITH DIFFERENTIAL/PLATELET  COMPREHENSIVE METABOLIC PANEL  TROPONIN I    EKG None  Radiology No results found.  Procedures Procedures (including critical care time)  Medications Ordered in ED Medications  albuterol (PROVENTIL) (2.5 MG/3ML) 0.083% nebulizer solution 5 mg (has no administration in time range)  ipratropium (ATROVENT) nebulizer solution 0.5 mg (has no administration in time range)  methylPREDNISolone sodium succinate (SOLU-MEDROL) 125 mg/2 mL injection 125 mg (has no administration in time range)  sodium chloride 0.9 % bolus 1,000 mL (has no administration in time range)     Initial Impression / Assessment and Plan / ED Course  I have reviewed the triage vital signs and the nursing notes.  Pertinent labs & imaging results that were available during my care of the patient were reviewed by me and considered in my medical decision making (see chart for details).  84 year old female with a history of COPD on O2 at home (patient does not know how much) presenting with worsening dyspnea on exertion for the last day. On arrival, she had increased work of breathing with accessory  muscle use. She was requiring 2L O2 by [place] to keep O2 saturations >88%. Will give duoneb, mag, IV Solumedrol, 1L NS bolus. Will order CBC, CMP, VBG, troponin, EKG, and CXR to evaluate further.  10:30PM: Patient given another albuterol neb. Patient feels improved after steroids and breathing treatments. Still with diminished lung sounds, but improved from previous exams. She ambulated in the hallway on 2L O2 and was able to maintain saturations above 88%. She stated that her breathing felt at baseline with ambulation. She would like to go home and is felt  to be safe for discharge. Will send her home on 5 day course of Prednisone.  Final Clinical Impressions(s) / ED Diagnoses   Final diagnoses:  None    ED Discharge Orders    None       Onesimo Lingard, Pete Pelt, MD 04/14/18 2306    Sela Hua, MD 04/14/18 2307    Drenda Freeze, MD 04/14/18 2322

## 2018-04-14 NOTE — ED Notes (Signed)
Patient ambulated on O2 @ 2 lpm via N/C. Patient initial SPO2 95% and heart rate 112. SPO2 89% and heart rate of 129 while ambulating. Patient stated that she felt at her baseline while ambulating.

## 2018-04-14 NOTE — Discharge Instructions (Addendum)
It was so nice to meet you!  Your shortness of breath is likely coming from your COPD. We gave you some breathing treatments and steroids in the emergency department. We have prescribed some Prednisone for you to take at home for the next 4 days. Please take this daily at breakfast.  It is very important to follow-up with your lung doctor in the next 1-2 weeks.

## 2018-04-14 NOTE — ED Triage Notes (Signed)
Patient states that she has been SOB all day long. The patient is currently on home o2 and having dyspnea. The patient is unable to answer complete sentences just one word answers

## 2018-04-29 ENCOUNTER — Encounter: Payer: Self-pay | Admitting: Pulmonary Disease

## 2018-04-29 DIAGNOSIS — J42 Unspecified chronic bronchitis: Secondary | ICD-10-CM

## 2018-05-01 NOTE — Telephone Encounter (Signed)
Emailed patient back about which pharmacy she would like Rx's sent to. Patient is due for a 6 month ROV in July so asked her to call the office to schedule. Will send in Rxs when we have clarification on which pharmacy to use.

## 2018-05-03 MED ORDER — BUDESONIDE-FORMOTEROL FUMARATE 160-4.5 MCG/ACT IN AERO
2.0000 | INHALATION_SPRAY | Freq: Two times a day (BID) | RESPIRATORY_TRACT | 0 refills | Status: DC
Start: 2018-05-03 — End: 2018-07-06

## 2018-05-03 MED ORDER — IPRATROPIUM BROMIDE HFA 17 MCG/ACT IN AERS: 2.0000 | INHALATION_SPRAY | Freq: Four times a day (QID) | RESPIRATORY_TRACT | 0 refills | Status: DC | PRN

## 2018-05-03 NOTE — Telephone Encounter (Signed)
Medications have been sent to pharmacy. Nothing else needed at time of call.

## 2018-05-28 ENCOUNTER — Ambulatory Visit: Payer: Medicare HMO | Admitting: Family Medicine

## 2018-05-28 DIAGNOSIS — Z0289 Encounter for other administrative examinations: Secondary | ICD-10-CM

## 2018-06-04 ENCOUNTER — Encounter (HOSPITAL_BASED_OUTPATIENT_CLINIC_OR_DEPARTMENT_OTHER): Payer: Self-pay | Admitting: *Deleted

## 2018-06-04 ENCOUNTER — Other Ambulatory Visit: Payer: Self-pay

## 2018-06-04 ENCOUNTER — Emergency Department (HOSPITAL_BASED_OUTPATIENT_CLINIC_OR_DEPARTMENT_OTHER): Payer: Medicare HMO

## 2018-06-04 ENCOUNTER — Emergency Department (HOSPITAL_BASED_OUTPATIENT_CLINIC_OR_DEPARTMENT_OTHER)
Admission: EM | Admit: 2018-06-04 | Discharge: 2018-06-04 | Disposition: A | Discharge status: Home / Self Care | Payer: Medicare HMO | Attending: Emergency Medicine | Admitting: Emergency Medicine

## 2018-06-04 DIAGNOSIS — Z87891 Personal history of nicotine dependence: Secondary | ICD-10-CM | POA: Diagnosis not present

## 2018-06-04 DIAGNOSIS — Z79899 Other long term (current) drug therapy: Secondary | ICD-10-CM | POA: Diagnosis not present

## 2018-06-04 DIAGNOSIS — I10 Essential (primary) hypertension: Secondary | ICD-10-CM | POA: Insufficient documentation

## 2018-06-04 DIAGNOSIS — J441 Chronic obstructive pulmonary disease with (acute) exacerbation: Secondary | ICD-10-CM | POA: Diagnosis not present

## 2018-06-04 DIAGNOSIS — R0602 Shortness of breath: Secondary | ICD-10-CM | POA: Diagnosis present

## 2018-06-04 LAB — CBC WITH DIFFERENTIAL/PLATELET
BASOS ABS: 0 10*3/uL (ref 0.0–0.1)
Basophils Relative: 0 %
EOS ABS: 0.2 10*3/uL (ref 0.0–0.7)
Eosinophils Relative: 2 %
HCT: 42.8 % (ref 36.0–46.0)
Hemoglobin: 13.6 g/dL (ref 12.0–15.0)
Lymphocytes Relative: 8 %
Lymphs Abs: 0.8 10*3/uL (ref 0.7–4.0)
MCH: 31.2 pg (ref 26.0–34.0)
MCHC: 31.8 g/dL (ref 30.0–36.0)
MCV: 98.2 fL (ref 78.0–100.0)
MONO ABS: 1 10*3/uL (ref 0.1–1.0)
MONOS PCT: 11 %
NEUTROS ABS: 7.5 10*3/uL (ref 1.7–7.7)
NEUTROS PCT: 79 %
PLATELETS: 238 10*3/uL (ref 150–400)
RBC: 4.36 MIL/uL (ref 3.87–5.11)
RDW: 12.3 % (ref 11.5–15.5)
WBC: 9.5 10*3/uL (ref 4.0–10.5)

## 2018-06-04 LAB — COMPREHENSIVE METABOLIC PANEL
ALT: 15 U/L (ref 0–44)
ANION GAP: 10 (ref 5–15)
AST: 22 U/L (ref 15–41)
Albumin: 3.7 g/dL (ref 3.5–5.0)
Alkaline Phosphatase: 98 U/L (ref 38–126)
BILIRUBIN TOTAL: 0.8 mg/dL (ref 0.3–1.2)
BUN: 8 mg/dL (ref 8–23)
CHLORIDE: 89 mmol/L — AB (ref 98–111)
CO2: 37 mmol/L — ABNORMAL HIGH (ref 22–32)
Calcium: 9.3 mg/dL (ref 8.9–10.3)
Creatinine, Ser: 0.4 mg/dL — ABNORMAL LOW (ref 0.44–1.00)
Glucose, Bld: 125 mg/dL — ABNORMAL HIGH (ref 70–99)
POTASSIUM: 3.9 mmol/L (ref 3.5–5.1)
Sodium: 136 mmol/L (ref 135–145)
TOTAL PROTEIN: 6.7 g/dL (ref 6.5–8.1)

## 2018-06-04 LAB — I-STAT VENOUS BLOOD GAS, ED
ACID-BASE EXCESS: 14 mmol/L — AB (ref 0.0–2.0)
BICARBONATE: 43.7 mmol/L — AB (ref 20.0–28.0)
O2 SAT: 61 %
PH VEN: 7.375 (ref 7.250–7.430)
TCO2: 46 mmol/L — AB (ref 22–32)
pCO2, Ven: 74.8 mmHg (ref 44.0–60.0)
pO2, Ven: 34 mmHg (ref 32.0–45.0)

## 2018-06-04 LAB — TROPONIN I: Troponin I: <0.03 ng/mL (ref <0.03)

## 2018-06-04 LAB — BRAIN NATRIURETIC PEPTIDE: B NATRIURETIC PEPTIDE 5: 24.4 pg/mL (ref 0.0–100.0)

## 2018-06-04 LAB — LIPASE, BLOOD: LIPASE: 30 U/L (ref 11–51)

## 2018-06-04 MED ORDER — SODIUM CHLORIDE 0.9 % IV BOLUS
500.0000 mL | Freq: Once | INTRAVENOUS | Status: AC
  Administered 2018-06-04: 500 mL via INTRAVENOUS

## 2018-06-04 MED ORDER — AZITHROMYCIN 250 MG PO TABS: 250.0000 mg | ORAL_TABLET | Freq: Every day | ORAL | 0 refills | Status: DC

## 2018-06-04 MED ORDER — METHYLPREDNISOLONE SODIUM SUCC 125 MG IJ SOLR
125.0000 mg | Freq: Once | INTRAMUSCULAR | Status: AC
  Administered 2018-06-04: 125 mg via INTRAVENOUS
  Filled 2018-06-04: qty 2

## 2018-06-04 MED ORDER — ALBUTEROL SULFATE (2.5 MG/3ML) 0.083% IN NEBU
5.0000 mg | INHALATION_SOLUTION | Freq: Once | RESPIRATORY_TRACT | Status: AC
  Administered 2018-06-04: 5 mg via RESPIRATORY_TRACT
  Filled 2018-06-04: qty 6

## 2018-06-04 MED ORDER — PREDNISONE 20 MG PO TABS: ORAL_TABLET | ORAL | 0 refills | Status: DC

## 2018-06-04 MED ORDER — IPRATROPIUM BROMIDE 0.02 % IN SOLN
0.5000 mg | Freq: Once | RESPIRATORY_TRACT | Status: AC
  Administered 2018-06-04: 0.5 mg via RESPIRATORY_TRACT
  Filled 2018-06-04: qty 2.5

## 2018-06-04 MED ORDER — IOPAMIDOL (ISOVUE-370) INJECTION 76%
100.0000 mL | Freq: Once | INTRAVENOUS | Status: AC | PRN
  Administered 2018-06-04: 100 mL via INTRAVENOUS

## 2018-06-04 MED FILL — predniSONE 20 MG TABS: 20 | 6 days supply | Qty: 12 | Fill #0

## 2018-06-04 MED FILL — AZITHROMYCIN 250 MG TABLET: 250 | 5 days supply | Qty: 6 | Fill #0

## 2018-06-04 NOTE — Discharge Instructions (Addendum)
Use albuterol as needed for cough.   Take prednisone as prescribed.   See your pulmonologist, primary care doctor  Return to ER if you have worse trouble breathing, shortness of breath, fever.

## 2018-06-04 NOTE — ED Provider Notes (Signed)
[facility] EMERGENCY DEPARTMENT Provider Note   CSN: [id number] Arrival date & time: 06/04/18  1251     History   Chief Complaint Chief Complaint  Patient presents with  . Shortness of Breath    HPI Sheila Mcconnell is a 84 y.o. female hx of COPD, reflux, HTN, HL here with SOB.  Patient has COPD and is on 2 L nasal cannula at baseline.  Since this morning, patient has been having some worsening shortness of breath.  Had a nonproductive cough but denies any leg swelling or fevers.  Patient states that she has some leg swelling for the last several days.  She also had poor appetite over the last several months in general.  She was seen in the ED out 2 months ago for COPD exacerbation and was given a course of steroids and felt better until recently.  Denies any fevers or chills or productive cough.  Patient follows up with pulmonary outpatient.  The history is provided by the patient.    Past Medical History:  Diagnosis Date  . Allergy   . Cataracts, bilateral   . Dependence on supplemental oxygen    3.5l (pts stated 2.5 LNC), [facility] services  . Emphysema of lung ([place])   . GERD (gastroesophageal reflux disease)   . Hyperlipidemia   . Hypertension   . Osteoporosis    had been on Boniva    Patient Active Problem List   Diagnosis Date Noted  . Emphysema lung ([place]) 06/27/2017  . Oxygen dependent 06/27/2017  . Hypertension 06/27/2017    Past Surgical History:  Procedure Laterality Date  . NO PAST SURGERIES       OB History    Gravida  4   Para  4   Term      Preterm      AB      Living  4     SAB      TAB      Ectopic      Multiple      Live Births               Home Medications    Prior to Admission medications   Medication Sig Start Date End Date Taking? Authorizing Provider  amLODipine (NORVASC) 5 MG tablet Take 1 tablet (5 mg total) daily by mouth. 10/16/17   Kuneff, Renee A, DO  budesonide-formoterol (SYMBICORT) 160-4.5 MCG/ACT  inhaler Inhale 2 puffs into the lungs 2 (two) times daily. 05/03/18   Juanito Doom, MD  ipratropium (ATROVENT HFA) 17 MCG/ACT inhaler Inhale 2 puffs into the lungs every 6 (six) hours as needed. 05/03/18   Juanito Doom, MD  predniSONE (DELTASONE) 50 MG tablet Take 1 tablet (50 mg total) by mouth daily with breakfast. 04/14/18   Mayo, Pete Pelt, MD  tiotropium (SPIRIVA) 18 MCG inhalation capsule Place 1 capsule (18 mcg total) into inhaler and inhale daily. 04/11/18   Juanito Doom, MD    Family History Family History  Problem Relation Age of Onset  . Diabetes Mother   . Hearing loss Mother   . Lymphoma Mother     Social History Social History   Tobacco Use  . Smoking status: Former Smoker    Packs/day: 1.00    Years: 50.00    Pack years: 50.00    Types: Cigarettes  . Smokeless tobacco: Never Used  Substance Use Topics  . Alcohol use: Yes    Alcohol/week: 1.2 oz    Types:  2 Glasses of wine per week  . Drug use: No     Allergies   Sulfa antibiotics   Review of Systems Review of Systems  Respiratory: Positive for shortness of breath.   All other systems reviewed and are negative.    Physical Exam Updated Vital Signs BP (!) 182/122 Comment: on HHN Tx  Pulse (!) 114   Temp 98.2 F (36.8 C) (Oral)   Resp 19   Ht 5\' 4"  (1.626 m)   Wt 47 kg (103 lb 11.2 oz)   SpO2 95%   BMI 17.80 kg/m   Physical Exam  Constitutional: She is oriented to person, place, and time.  Tachypneic, some wheezing   HENT:  Head: Normocephalic.  Mouth/Throat: Oropharynx is clear and moist.  Eyes: Pupils are equal, round, and reactive to light. EOM are normal.  Neck: Normal range of motion.  Cardiovascular: Normal rate.  Pulmonary/Chest: Breath sounds normal.  Tachypneic, minimal wheezing, diminished bilateral bases   Abdominal: Soft. Bowel sounds are normal.  Musculoskeletal:       Right lower leg: Normal.       Left lower leg: Normal.  1+ edema bilaterally     Neurological: She is alert and oriented to person, place, and time.  Skin: Skin is warm. Capillary refill takes less than 2 seconds.  Psychiatric: She has a normal mood and affect.  Nursing note and vitals reviewed.    ED Treatments / Results  Labs (all labs ordered are listed, but only abnormal results are displayed) Labs Reviewed  I-STAT VENOUS BLOOD GAS, ED - Abnormal; Notable for the following components:      Result Value   pCO2, Ven 74.8 (*)    Bicarbonate 43.7 (*)    TCO2 46 (*)    Acid-Base Excess 14.0 (*)    All other components within normal limits  CBC WITH DIFFERENTIAL/PLATELET  TROPONIN I  BRAIN NATRIURETIC PEPTIDE  BLOOD GAS, VENOUS  COMPREHENSIVE METABOLIC PANEL  LIPASE, BLOOD  TROPONIN I    EKG EKG Interpretation  Date/Time:  Monday June 04 2018 13:36:46 EDT Ventricular Rate:  102 PR Interval:    QRS Duration: 114 QT Interval:  346 QTC Calculation: 451 R Axis:   104 Text Interpretation:  Sinus tachycardia Right atrial enlargement Incomplete right bundle branch block Abnormal lateral Q waves No significant change since last tracing Confirmed by Wandra Arthurs [phone]) on 06/04/2018 1:51:27 PM   Radiology Dg Chest 2 View  Result Date: 06/04/2018 CLINICAL DATA:  Cough and shortness of breath for a few days worse today, former smoker, history emphysema, hypertension EXAM: CHEST - 2 VIEW COMPARISON:  04/14/2018 FINDINGS: Normal heart size, mediastinal contours, and pulmonary vascularity. Atherosclerotic calcifications aorta. Emphysematous and bronchitic changes consistent with COPD. Mild biapical scarring. Calcified granuloma RIGHT lower lobe. No acute infiltrate, pleural effusion or pneumothorax. Question new 5 mm nodular density in RIGHT mid lung. Diffuse osseous demineralization. IMPRESSION: COPD changes without acute infiltrate. Question new 5 mm nodular density RIGHT mid lung; CT chest recommended to exclude developing pulmonary nodule. Electronically Signed    By: Lavonia Dana M.D.   On: 06/04/2018 14:22    Procedures Procedures (including critical care time)  Medications Ordered in ED Medications  sodium chloride 0.9 % bolus 500 mL (has no administration in time range)  albuterol (PROVENTIL) (2.5 MG/3ML) 0.083% nebulizer solution 5 mg (5 mg Nebulization Given 06/04/18 1419)  ipratropium (ATROVENT) nebulizer solution 0.5 mg (0.5 mg Nebulization Given 06/04/18 1420)  methylPREDNISolone sodium succinate (  SOLU-MEDROL) 125 mg/2 mL injection 125 mg (125 mg Intravenous Given 06/04/18 1400)     Initial Impression / Assessment and Plan / ED Course  I have reviewed the triage vital signs and the nursing notes.  Pertinent labs & imaging results that were available during my care of the patient were reviewed by me and considered in my medical decision making (see chart for details).     Sheila Mcconnell is a 84 y.o. female here with cough, SOB. Hx of COPD on 2 L [place] at baseline. Has worsening leg swelling as well. Consider COPD vs CHF vs pneumonia. Will get labs, BNP, trop, CXR. Will give nebs, steroids and reassess.   3:30 PM  VBG reassuring. PH 7.3. CO2 75 but she is compensated. CXR showed new nodule. BNP normal. Minimal wheezing after 1 neb. Will get CTA to further assess nodule and etiology of her SOB. Slightly tachycardic after 1 neb. Will give 500 cc bolus. Signed out to Dr. Wilson Singer to follow up CTA, recheck trop at 4:30 pm, and reassess patient.      Final Clinical Impressions(s) / ED Diagnoses   Final diagnoses:  None    ED Discharge Orders    None       Drenda Freeze, MD 06/04/18 [phone]

## 2018-06-04 NOTE — ED Triage Notes (Signed)
SOB worse today. She is on home oxygen 2/m [place]. Hx of chronic respiratory failure and COPD.

## 2018-06-28 ENCOUNTER — Ambulatory Visit: Payer: Medicare HMO | Admitting: Pulmonary Disease

## 2018-06-28 NOTE — Progress Notes (Deleted)
Subjective:   PATIENT ID: Sheila Mcconnell GENDER: female DOB: [birth year], MRN: [id number]  Synopsis: Referred in Dec 2018 for COPD  HPI  No chief complaint on file.  ***   Past Medical History:  Diagnosis Date  . Allergy   . Cataracts, bilateral   . Dependence on supplemental oxygen    3.5l (pts stated 2.5 LNC), [facility]  . Emphysema of lung ([place])   . GERD (gastroesophageal reflux disease)   . Hyperlipidemia   . Hypertension   . Osteoporosis    had been on Boniva     Family History  Problem Relation Age of Onset  . Diabetes Mother   . Hearing loss Mother   . Lymphoma Mother      Review of Systems  Constitutional: Negative for chills, fever, malaise/fatigue and weight loss.  HENT: Negative for congestion, nosebleeds, sinus pain and sore throat.   Eyes: Negative for photophobia, pain and discharge.  Respiratory: Positive for cough and shortness of breath. Negative for hemoptysis, sputum production and wheezing.   Cardiovascular: Negative for chest pain, palpitations, orthopnea and leg swelling.  Gastrointestinal: Negative for abdominal pain, constipation, diarrhea, nausea and vomiting.  Genitourinary: Negative for dysuria, frequency, hematuria and urgency.  Musculoskeletal: Negative for back pain, joint pain, myalgias and neck pain.  Skin: Negative for itching and rash.  Neurological: Negative for tingling, tremors, sensory change, speech change, focal weakness, seizures, weakness and headaches.  Psychiatric/Behavioral: Negative for memory loss, substance abuse and suicidal ideas. The patient is not nervous/anxious.   All other systems reviewed and are negative.     Objective:  Physical Exam   There were no vitals filed for this visit.  ***  CBC    Component Value Date/Time   WBC 9.5 06/04/2018 1358   RBC 4.36 06/04/2018 1358   HGB 13.6 06/04/2018 1358   HCT 42.8 06/04/2018 1358   PLT 238 06/04/2018 1358   MCV 98.2 06/04/2018 1358   MCH 31.2  06/04/2018 1358   MCHC 31.8 06/04/2018 1358   RDW 12.3 06/04/2018 1358   LYMPHSABS 0.8 06/04/2018 1358   MONOABS 1.0 06/04/2018 1358   EOSABS 0.2 06/04/2018 1358   BASOSABS 0.0 06/04/2018 1358     Chest imaging: 10-2017 CXR images reviewed showing emphysema and granulomatous changes  PFT:  Labs:  Path:  Echo:  Heart Catheterization:  10/2017 PCP notes reviewed where she was seen for COPD, O2 set at 4L      Assessment & Plan:   No diagnosis found.  Discussion: ***   Current Outpatient Medications:  .  amLODipine (NORVASC) 5 MG tablet, Take 1 tablet (5 mg total) daily by mouth., Disp: 90 tablet, Rfl: 1 .  azithromycin (ZITHROMAX) 250 MG tablet, Take 1 tablet (250 mg total) by mouth daily. Take first 2 tablets together, then 1 every day until finished., Disp: 6 tablet, Rfl: 0 .  budesonide-formoterol (SYMBICORT) 160-4.5 MCG/ACT inhaler, Inhale 2 puffs into the lungs 2 (two) times daily., Disp: 3 Inhaler, Rfl: 0 .  ipratropium (ATROVENT HFA) 17 MCG/ACT inhaler, Inhale 2 puffs into the lungs every 6 (six) hours as needed., Disp: 3 Inhaler, Rfl: 0 .  predniSONE (DELTASONE) 20 MG tablet, Take 60 mg daily x 2 days then 40 mg daily x 2 days then 20 mg daily x 2 days, Disp: 12 tablet, Rfl: 0 .  tiotropium (SPIRIVA) 18 MCG inhalation capsule, Place 1 capsule (18 mcg total) into inhaler and inhale daily., Disp: 90 capsule, Rfl: 3

## 2018-07-01 ENCOUNTER — Emergency Department (HOSPITAL_COMMUNITY)
Admission: EM | Admit: 2018-07-01 | Discharge: 2018-07-02 | Disposition: A | Discharge status: Home / Self Care | Payer: Medicare HMO | Attending: Emergency Medicine | Admitting: Emergency Medicine

## 2018-07-01 ENCOUNTER — Encounter (HOSPITAL_COMMUNITY): Payer: Self-pay | Admitting: Emergency Medicine

## 2018-07-01 ENCOUNTER — Emergency Department (HOSPITAL_COMMUNITY): Payer: Medicare HMO

## 2018-07-01 DIAGNOSIS — R000 Tachycardia, unspecified: Secondary | ICD-10-CM | POA: Diagnosis not present

## 2018-07-01 DIAGNOSIS — Z79899 Other long term (current) drug therapy: Secondary | ICD-10-CM | POA: Diagnosis not present

## 2018-07-01 DIAGNOSIS — Z87891 Personal history of nicotine dependence: Secondary | ICD-10-CM | POA: Diagnosis not present

## 2018-07-01 DIAGNOSIS — J441 Chronic obstructive pulmonary disease with (acute) exacerbation: Secondary | ICD-10-CM | POA: Insufficient documentation

## 2018-07-01 DIAGNOSIS — I10 Essential (primary) hypertension: Secondary | ICD-10-CM | POA: Insufficient documentation

## 2018-07-01 DIAGNOSIS — R0602 Shortness of breath: Secondary | ICD-10-CM | POA: Diagnosis not present

## 2018-07-01 DIAGNOSIS — R0902 Hypoxemia: Secondary | ICD-10-CM | POA: Diagnosis not present

## 2018-07-01 DIAGNOSIS — R0689 Other abnormalities of breathing: Secondary | ICD-10-CM | POA: Diagnosis not present

## 2018-07-01 LAB — CBC WITH DIFFERENTIAL/PLATELET
BASOS PCT: 0 %
Basophils Absolute: 0 10*3/uL (ref 0.0–0.1)
EOS ABS: 0.2 10*3/uL (ref 0.0–0.7)
Eosinophils Relative: 3 %
HEMATOCRIT: 46.7 % — AB (ref 36.0–46.0)
HEMOGLOBIN: 14.3 g/dL (ref 12.0–15.0)
LYMPHS ABS: 1 10*3/uL (ref 0.7–4.0)
Lymphocytes Relative: 14 %
MCH: 31.4 pg (ref 26.0–34.0)
MCHC: 30.6 g/dL (ref 30.0–36.0)
MCV: 102.6 fL — ABNORMAL HIGH (ref 78.0–100.0)
MONOS PCT: 12 %
Monocytes Absolute: 0.9 10*3/uL (ref 0.1–1.0)
NEUTROS ABS: 5.1 10*3/uL (ref 1.7–7.7)
NEUTROS PCT: 71 %
Platelets: 261 10*3/uL (ref 150–400)
RBC: 4.55 MIL/uL (ref 3.87–5.11)
RDW: 13.1 % (ref 11.5–15.5)
WBC: 7.2 10*3/uL (ref 4.0–10.5)

## 2018-07-01 NOTE — ED Triage Notes (Signed)
Per EMS , pt. from home with complaint of SOB which gotten worse this evening. Per pt.'s daughter , pt. Was admitted here a month ago for the same problem and been noted with increasing weakness and SOB since. Denied fever at home ,denied pain. Received a total of 5mg  albuterol breathing treatment via EMS and claimed a little relief upon arrival to ED. Per daughter, pt. Has DNR form but was misplaced.

## 2018-07-01 NOTE — ED Provider Notes (Signed)
[facility] DEPT Provider Note: Georgena Spurling, MD, FACEP  CSN: [id number] MRN: [id number] ARRIVAL: 07/01/18 at [place]: [place] of Breath   HISTORY OF PRESENT ILLNESS  07/01/18 11:16 PM Sheila Mcconnell is a 84 y.o. female with a history of COPD on home oxygen.  She is here with acute exacerbation of shortness of breath that began about an hour prior to arrival.  EMS found her oxygen saturation to be lower than normal and increased her usual 2 L to 4 L.  She was given a 5 mg albuterol neb treatment prior to arrival with improvement in her shortness of breath.  The patient states she feels back to baseline.  She denies chest pain, fever, chills, nausea, vomiting or diarrhea. Her oxygen saturation on arrival is 98.  She was noted to be tachycardic after the albuterol treatment.  She was not given steroids prior to arrival.   Past Medical History:  Diagnosis Date  . Allergy   . Cataracts, bilateral   . Dependence on supplemental oxygen    3.5l (pts stated 2.5 LNC), [facility]  . Emphysema of lung ([place])   . GERD (gastroesophageal reflux disease)   . Hyperlipidemia   . Hypertension   . Osteoporosis    had been on Boniva    Past Surgical History:  Procedure Laterality Date  . NO PAST SURGERIES      Family History  Problem Relation Age of Onset  . Diabetes Mother   . Hearing loss Mother   . Lymphoma Mother     Social History   Tobacco Use  . Smoking status: Former Smoker    Packs/day: 1.00    Years: 50.00    Pack years: 50.00    Types: Cigarettes  . Smokeless tobacco: Never Used  Substance Use Topics  . Alcohol use: Yes    Alcohol/week: 1.2 oz    Types: 2 Glasses of wine per week  . Drug use: No    Prior to Admission medications   Medication Sig Start Date End Date Taking? Authorizing Provider  amLODipine (NORVASC) 5 MG tablet Take 1 tablet (5 mg total) daily by mouth. 10/16/17   Kuneff, Renee A, DO  azithromycin (ZITHROMAX)  250 MG tablet Take 1 tablet (250 mg total) by mouth daily. Take first 2 tablets together, then 1 every day until finished. 06/04/18   Virgel Manifold, MD  budesonide-formoterol [facility]) 160-4.5 MCG/ACT inhaler Inhale 2 puffs into the lungs 2 (two) times daily. 05/03/18   Juanito Doom, MD  ipratropium (ATROVENT HFA) 17 MCG/ACT inhaler Inhale 2 puffs into the lungs every 6 (six) hours as needed. 05/03/18   Juanito Doom, MD  predniSONE (DELTASONE) 20 MG tablet Take 60 mg daily x 2 days then 40 mg daily x 2 days then 20 mg daily x 2 days 06/04/18   Drenda Freeze, MD  tiotropium (SPIRIVA) 18 MCG inhalation capsule Place 1 capsule (18 mcg total) into inhaler and inhale daily. 04/11/18   Juanito Doom, MD    Allergies Sulfa antibiotics   REVIEW OF SYSTEMS  Negative except as noted here or in the History of Present Illness.   PHYSICAL EXAMINATION  Initial Vital Signs Blood pressure (!) 151/76, pulse (!) 121, temperature 97.9 F (36.6 C), temperature source Oral, resp. rate 19, SpO2 98 %.  Examination General: Well-developed, well-nourished female in no acute distress; appearance consistent with age of record HENT: normocephalic; atraumatic Eyes: pupils equal, round and reactive  to light; extraocular muscles intact; bilateral pseudophakia Neck: supple Heart: regular rate and rhythm; tachycardia; occasional PACs Lungs: clear to auscultation bilaterally; prolonged expirations Abdomen: soft; nondistended; nontender; no masses or hepatosplenomegaly; bowel sounds present Extremities: No deformity; full range of motion; trace edema of lower legs, left greater than right Neurologic: Awake, alert; motor function intact in all extremities and symmetric; no facial droop; hard of hearing Skin: Warm and dry Psychiatric: Normal mood and affect   RESULTS  Summary of this visit's results, reviewed by myself:   EKG Interpretation  Date/Time:  Sunday July 01 2018 23:04:13  EDT Ventricular Rate:  114 PR Interval:    QRS Duration: 110 QT Interval:  329 QTC Calculation: 453 R Axis:   120 Text Interpretation:  Sinus tachycardia Premature atrial complexes Right bundle branch block Confirmed by Jadee Golebiewski [phone]) on 07/02/2018 12:25:12 AM      Laboratory Studies: Results for orders placed or performed during the hospital encounter of 07/01/18 (from the past 24 hour(s))  CBC with Differential/Platelet     Status: Abnormal   Collection Time: 07/01/18 11:37 PM  Result Value Ref Range   WBC 7.2 4.0 - 10.5 K/uL   RBC 4.55 3.87 - 5.11 MIL/uL   Hemoglobin 14.3 12.0 - 15.0 g/dL   HCT 46.7 (H) 36.0 - 46.0 %   MCV 102.6 (H) 78.0 - 100.0 fL   MCH 31.4 26.0 - 34.0 pg   MCHC 30.6 30.0 - 36.0 g/dL   RDW 13.1 11.5 - 15.5 %   Platelets 261 150 - 400 K/uL   Neutrophils Relative % 71 %   Neutro Abs 5.1 1.7 - 7.7 K/uL   Lymphocytes Relative 14 %   Lymphs Abs 1.0 0.7 - 4.0 K/uL   Monocytes Relative 12 %   Monocytes Absolute 0.9 0.1 - 1.0 K/uL   Eosinophils Relative 3 %   Eosinophils Absolute 0.2 0.0 - 0.7 K/uL   Basophils Relative 0 %   Basophils Absolute 0.0 0.0 - 0.1 K/uL  Basic metabolic panel     Status: Abnormal   Collection Time: 07/01/18 11:37 PM  Result Value Ref Range   Sodium 140 135 - 145 mmol/L   Potassium 4.3 3.5 - 5.1 mmol/L   Chloride 92 (L) 98 - 111 mmol/L   CO2 34 (H) 22 - 32 mmol/L   Glucose, Bld 123 (H) 70 - 99 mg/dL   BUN 13 8 - 23 mg/dL   Creatinine, Ser 0.38 (L) 0.44 - 1.00 mg/dL   Calcium 9.3 8.9 - 10.3 mg/dL   GFR calc non Af Amer >60 >60 mL/min   GFR calc Af Amer >60 >60 mL/min   Anion gap 14 5 - 15   Imaging Studies: Dg Chest 2 View  Result Date: 07/02/2018 CLINICAL DATA:  Worsening shortness of breath. Similar symptoms 1 month ago. Oxygen dependent, history of emphysema. EXAM: CHEST - 2 VIEW COMPARISON:  CT chest June 04, 2018 and chest radiograph June 04, 2018 FINDINGS: Cardiomediastinal silhouette is normal. Calcified aortic knob.  Chronic interstitial changes increased lung volumes with flattened hemidiaphragms. Scattered granulomas. No pleural effusion or focal consolidation. No pneumothorax. Osteopenia. Mild old upper thoracic compression fracture. IMPRESSION: COPD.  No focal consolidation. Aortic Atherosclerosis (ICD10-I70.0). Electronically Signed   By: Elon Alas M.D.   On: 07/02/2018 00:10    ED COURSE and MDM  Nursing notes and initial vitals signs, including pulse oximetry, reviewed.  Vitals:   07/01/18 2258 07/01/18 2305  BP:  (!) 151/76  Pulse:  (!) 121  Resp:  19  Temp:  97.9 F (36.6 C)  TempSrc:  Oral  SpO2: 95% 98%   12:30 AM Patient continues to breathe at her baseline.  She does not wish to be admitted.  She lives in independent living but has access to assistance should her breathing worsen.  PROCEDURES    ED DIAGNOSES     ICD-10-CM   1. COPD exacerbation ([place]) J44.1        Bryam Taborda, MD 07/02/18 0030

## 2018-07-01 NOTE — ED Notes (Signed)
Bed: VQ94 Expected date:  Expected time:  Means of arrival:  Comments: EMS 84 yo female from home/dyspnea upon exertion/wheezing/albuterol neb per EMS

## 2018-07-02 LAB — BASIC METABOLIC PANEL
Anion gap: 14 (ref 5–15)
BUN: 13 mg/dL (ref 8–23)
CO2: 34 mmol/L — ABNORMAL HIGH (ref 22–32)
CREATININE: 0.38 mg/dL — AB (ref 0.44–1.00)
Calcium: 9.3 mg/dL (ref 8.9–10.3)
Chloride: 92 mmol/L — ABNORMAL LOW (ref 98–111)
GFR calc Af Amer: >60 mL/min (ref >60)
GLUCOSE: 123 mg/dL — AB (ref 70–99)
POTASSIUM: 4.3 mmol/L (ref 3.5–5.1)
SODIUM: 140 mmol/L (ref 135–145)

## 2018-07-03 ENCOUNTER — Encounter (HOSPITAL_COMMUNITY): Payer: Self-pay | Admitting: Emergency Medicine

## 2018-07-03 ENCOUNTER — Other Ambulatory Visit: Payer: Self-pay

## 2018-07-03 ENCOUNTER — Observation Stay (HOSPITAL_COMMUNITY)
Admission: EM | Admit: 2018-07-03 | Discharge: 2018-07-06 | Disposition: A | Discharge status: Nursing Home (Includes ICF) | Payer: Medicare HMO | Attending: Internal Medicine | Admitting: Internal Medicine

## 2018-07-03 DIAGNOSIS — Z7189 Other specified counseling: Secondary | ICD-10-CM

## 2018-07-03 DIAGNOSIS — M4854XA Collapsed vertebra, not elsewhere classified, thoracic region, initial encounter for fracture: Secondary | ICD-10-CM | POA: Diagnosis not present

## 2018-07-03 DIAGNOSIS — K219 Gastro-esophageal reflux disease without esophagitis: Secondary | ICD-10-CM | POA: Diagnosis not present

## 2018-07-03 DIAGNOSIS — J9612 Chronic respiratory failure with hypercapnia: Secondary | ICD-10-CM | POA: Insufficient documentation

## 2018-07-03 DIAGNOSIS — I700 Atherosclerosis of aorta: Secondary | ICD-10-CM | POA: Insufficient documentation

## 2018-07-03 DIAGNOSIS — Z79899 Other long term (current) drug therapy: Secondary | ICD-10-CM | POA: Insufficient documentation

## 2018-07-03 DIAGNOSIS — Z681 Body mass index (BMI) 19 or less, adult: Secondary | ICD-10-CM | POA: Insufficient documentation

## 2018-07-03 DIAGNOSIS — R5381 Other malaise: Secondary | ICD-10-CM | POA: Insufficient documentation

## 2018-07-03 DIAGNOSIS — Z7951 Long term (current) use of inhaled steroids: Secondary | ICD-10-CM | POA: Insufficient documentation

## 2018-07-03 DIAGNOSIS — M810 Age-related osteoporosis without current pathological fracture: Secondary | ICD-10-CM | POA: Diagnosis not present

## 2018-07-03 DIAGNOSIS — R0609 Other forms of dyspnea: Secondary | ICD-10-CM

## 2018-07-03 DIAGNOSIS — Z66 Do not resuscitate: Secondary | ICD-10-CM | POA: Insufficient documentation

## 2018-07-03 DIAGNOSIS — Z882 Allergy status to sulfonamides status: Secondary | ICD-10-CM | POA: Diagnosis not present

## 2018-07-03 DIAGNOSIS — J441 Chronic obstructive pulmonary disease with (acute) exacerbation: Secondary | ICD-10-CM | POA: Diagnosis present

## 2018-07-03 DIAGNOSIS — J439 Emphysema, unspecified: Secondary | ICD-10-CM | POA: Diagnosis not present

## 2018-07-03 DIAGNOSIS — R600 Localized edema: Secondary | ICD-10-CM | POA: Insufficient documentation

## 2018-07-03 DIAGNOSIS — E440 Moderate protein-calorie malnutrition: Secondary | ICD-10-CM | POA: Diagnosis not present

## 2018-07-03 DIAGNOSIS — R262 Difficulty in walking, not elsewhere classified: Secondary | ICD-10-CM | POA: Diagnosis not present

## 2018-07-03 DIAGNOSIS — E785 Hyperlipidemia, unspecified: Secondary | ICD-10-CM | POA: Insufficient documentation

## 2018-07-03 DIAGNOSIS — I10 Essential (primary) hypertension: Secondary | ICD-10-CM | POA: Insufficient documentation

## 2018-07-03 DIAGNOSIS — Z9981 Dependence on supplemental oxygen: Secondary | ICD-10-CM | POA: Insufficient documentation

## 2018-07-03 DIAGNOSIS — M7989 Other specified soft tissue disorders: Secondary | ICD-10-CM

## 2018-07-03 DIAGNOSIS — J9611 Chronic respiratory failure with hypoxia: Secondary | ICD-10-CM | POA: Insufficient documentation

## 2018-07-03 DIAGNOSIS — Z87891 Personal history of nicotine dependence: Secondary | ICD-10-CM | POA: Diagnosis not present

## 2018-07-03 DIAGNOSIS — R000 Tachycardia, unspecified: Secondary | ICD-10-CM | POA: Diagnosis present

## 2018-07-03 DIAGNOSIS — M6281 Muscle weakness (generalized): Secondary | ICD-10-CM | POA: Diagnosis not present

## 2018-07-03 DIAGNOSIS — Z515 Encounter for palliative care: Secondary | ICD-10-CM | POA: Diagnosis not present

## 2018-07-03 DIAGNOSIS — J42 Unspecified chronic bronchitis: Secondary | ICD-10-CM

## 2018-07-03 NOTE — ED Triage Notes (Signed)
Pt arriving with SOB, pt seen 2 nights ago for same. Pt has emphysema. Wears O2 daily at home. Denies pain.

## 2018-07-04 ENCOUNTER — Emergency Department (HOSPITAL_COMMUNITY): Payer: Medicare HMO

## 2018-07-04 ENCOUNTER — Other Ambulatory Visit (HOSPITAL_COMMUNITY): Payer: Medicare HMO

## 2018-07-04 DIAGNOSIS — J439 Emphysema, unspecified: Secondary | ICD-10-CM | POA: Diagnosis present

## 2018-07-04 DIAGNOSIS — J9612 Chronic respiratory failure with hypercapnia: Secondary | ICD-10-CM | POA: Diagnosis present

## 2018-07-04 DIAGNOSIS — R0609 Other forms of dyspnea: Secondary | ICD-10-CM

## 2018-07-04 LAB — BRAIN NATRIURETIC PEPTIDE: B Natriuretic Peptide: 23.6 pg/mL (ref 0.0–100.0)

## 2018-07-04 LAB — CBC
HCT: 44.3 % (ref 36.0–46.0)
HEMOGLOBIN: 13.4 g/dL (ref 12.0–15.0)
MCH: 30.7 pg (ref 26.0–34.0)
MCHC: 30.2 g/dL (ref 30.0–36.0)
MCV: 101.6 fL — ABNORMAL HIGH (ref 78.0–100.0)
Platelets: 257 10*3/uL (ref 150–400)
RBC: 4.36 MIL/uL (ref 3.87–5.11)
RDW: 13.2 % (ref 11.5–15.5)
WBC: 9.2 10*3/uL (ref 4.0–10.5)

## 2018-07-04 LAB — BASIC METABOLIC PANEL
ANION GAP: 11 (ref 5–15)
ANION GAP: 11 (ref 5–15)
BUN: 10 mg/dL (ref 8–23)
BUN: 11 mg/dL (ref 8–23)
CALCIUM: 9 mg/dL (ref 8.9–10.3)
CALCIUM: 9.2 mg/dL (ref 8.9–10.3)
CO2: 34 mmol/L — ABNORMAL HIGH (ref 22–32)
CO2: 35 mmol/L — ABNORMAL HIGH (ref 22–32)
Chloride: 91 mmol/L — ABNORMAL LOW (ref 98–111)
Chloride: 92 mmol/L — ABNORMAL LOW (ref 98–111)
Creatinine, Ser: 0.4 mg/dL — ABNORMAL LOW (ref 0.44–1.00)
Creatinine, Ser: 0.49 mg/dL (ref 0.44–1.00)
GFR calc non Af Amer: >60 mL/min (ref >60)
Glucose, Bld: 123 mg/dL — ABNORMAL HIGH (ref 70–99)
Glucose, Bld: 139 mg/dL — ABNORMAL HIGH (ref 70–99)
POTASSIUM: 4.1 mmol/L (ref 3.5–5.1)
Potassium: 3.8 mmol/L (ref 3.5–5.1)
SODIUM: 136 mmol/L (ref 135–145)
Sodium: 138 mmol/L (ref 135–145)

## 2018-07-04 LAB — CBC WITH DIFFERENTIAL/PLATELET
BASOS ABS: 0 10*3/uL (ref 0.0–0.1)
BASOS PCT: 0 %
Eosinophils Absolute: 0.2 10*3/uL (ref 0.0–0.7)
Eosinophils Relative: 3 %
HEMATOCRIT: 46.4 % — AB (ref 36.0–46.0)
HEMOGLOBIN: 14.3 g/dL (ref 12.0–15.0)
LYMPHS PCT: 14 %
Lymphs Abs: 1.1 10*3/uL (ref 0.7–4.0)
MCH: 30.8 pg (ref 26.0–34.0)
MCHC: 30.8 g/dL (ref 30.0–36.0)
MCV: 99.8 fL (ref 78.0–100.0)
MONOS PCT: 13 %
Monocytes Absolute: 1 10*3/uL (ref 0.1–1.0)
NEUTROS ABS: 5.9 10*3/uL (ref 1.7–7.7)
NEUTROS PCT: 70 %
Platelets: 291 10*3/uL (ref 150–400)
RBC: 4.65 MIL/uL (ref 3.87–5.11)
RDW: 13.2 % (ref 11.5–15.5)
WBC: 8.3 10*3/uL (ref 4.0–10.5)

## 2018-07-04 LAB — BLOOD GAS, ARTERIAL
ACID-BASE EXCESS: 9.3 mmol/L — AB (ref 0.0–2.0)
Bicarbonate: 37.1 mmol/L — ABNORMAL HIGH (ref 20.0–28.0)
DRAWN BY: 232811
O2 CONTENT: 2 L/min
O2 SAT: 94.8 %
Patient temperature: 97.5
pCO2 arterial: 66.4 mmHg (ref 32.0–48.0)
pH, Arterial: 7.363 (ref 7.350–7.450)
pO2, Arterial: 73.3 mmHg — ABNORMAL LOW (ref 83.0–108.0)

## 2018-07-04 LAB — TROPONIN I: Troponin I: <0.03 ng/mL (ref <0.03)

## 2018-07-04 MED ORDER — ALBUTEROL (5 MG/ML) CONTINUOUS INHALATION SOLN
10.0000 mg/h | INHALATION_SOLUTION | Freq: Once | RESPIRATORY_TRACT | Status: AC
  Administered 2018-07-04: 10 mg/h via RESPIRATORY_TRACT
  Filled 2018-07-04: qty 20

## 2018-07-04 MED ORDER — IPRATROPIUM-ALBUTEROL 0.5-2.5 (3) MG/3ML IN SOLN
3.0000 mL | Freq: Three times a day (TID) | RESPIRATORY_TRACT | Status: DC
  Administered 2018-07-04 – 2018-07-06 (×8): 3 mL via RESPIRATORY_TRACT
  Filled 2018-07-04 (×8): qty 3

## 2018-07-04 MED ORDER — ONDANSETRON HCL 4 MG PO TABS
4.0000 mg | ORAL_TABLET | Freq: Four times a day (QID) | ORAL | Status: DC | PRN
  Administered 2018-07-04: 4 mg via ORAL
  Filled 2018-07-04 (×2): qty 1

## 2018-07-04 MED ORDER — ENOXAPARIN SODIUM 40 MG/0.4ML ~~LOC~~ SOLN
40.0000 mg | SUBCUTANEOUS | Status: DC
  Administered 2018-07-04 – 2018-07-06 (×3): 40 mg via SUBCUTANEOUS
  Filled 2018-07-04 (×3): qty 0.4

## 2018-07-04 MED ORDER — IPRATROPIUM-ALBUTEROL 0.5-2.5 (3) MG/3ML IN SOLN
3.0000 mL | Freq: Once | RESPIRATORY_TRACT | Status: AC
  Administered 2018-07-04: 3 mL via RESPIRATORY_TRACT
  Filled 2018-07-04: qty 3

## 2018-07-04 MED ORDER — AMLODIPINE BESYLATE 5 MG PO TABS
5.0000 mg | ORAL_TABLET | Freq: Every day | ORAL | Status: DC
  Administered 2018-07-04 – 2018-07-06 (×3): 5 mg via ORAL
  Filled 2018-07-04 (×3): qty 1

## 2018-07-04 MED ORDER — SENNOSIDES-DOCUSATE SODIUM 8.6-50 MG PO TABS: 1.0000 | ORAL_TABLET | Freq: Every evening | ORAL | Status: DC | PRN

## 2018-07-04 MED ORDER — ENSURE ENLIVE PO LIQD
237.0000 mL | Freq: Two times a day (BID) | ORAL | Status: DC
  Administered 2018-07-04 – 2018-07-05 (×3): 237 mL via ORAL

## 2018-07-04 MED ORDER — ALBUTEROL SULFATE (2.5 MG/3ML) 0.083% IN NEBU: 2.5000 mg | INHALATION_SOLUTION | RESPIRATORY_TRACT | Status: DC | PRN

## 2018-07-04 MED ORDER — ACETAMINOPHEN 650 MG RE SUPP: 650.0000 mg | Freq: Four times a day (QID) | RECTAL | Status: DC | PRN

## 2018-07-04 MED ORDER — ONDANSETRON HCL 4 MG/2ML IJ SOLN: 4.0000 mg | Freq: Four times a day (QID) | INTRAMUSCULAR | Status: DC | PRN

## 2018-07-04 MED ORDER — ACETAMINOPHEN 325 MG PO TABS
650.0000 mg | ORAL_TABLET | Freq: Four times a day (QID) | ORAL | Status: DC | PRN
  Filled 2018-07-04: qty 2

## 2018-07-04 MED ORDER — IPRATROPIUM-ALBUTEROL 0.5-2.5 (3) MG/3ML IN SOLN
3.0000 mL | Freq: Four times a day (QID) | RESPIRATORY_TRACT | Status: DC
  Administered 2018-07-04: 3 mL via RESPIRATORY_TRACT
  Filled 2018-07-04: qty 3

## 2018-07-04 NOTE — Progress Notes (Signed)
RN admit note: pt received from ED, rec'd report from [place], [place] via telephone call. Client rec'd on stretcher, ambulated to bed with assistance. Client on 2 L via [place], O2 saturation ranges from 94-97%, HR 120s, labored with ambulation, with rest unlabored. Oriented to unit, room, call bed, diet, and goals. Placed on moderate falls risks, verbalized understanding to call for help. Skin assessment completed/RN co-signed see assessment for details. Denied any concerns at this time, no signs of distress. Oriented to at your request. Inhaler will be sent to pharmacy. Client belongings at bedside, denies having wallet or anything of value. Will continue to monitor. Willene Hatchet , RN

## 2018-07-04 NOTE — Progress Notes (Signed)
PROGRESS NOTE    Sheila Mcconnell  JJK:[id number] DOB: 1927-01-17 DOA: 07/03/2018 PCP: Ma Hillock, DO   Brief Narrative: Patient is 84 year old female with past medical history of severe central emphysema, on oxygen at home at 2 L/min who presented to the emergency department for worsening shortness of breath .  She denied any cough, fever or chills on presentation.  Patient also reported of new bilateral lower extremity edema.  Admitted for further evaluation.  Assessment & Plan:   Principal Problem:   Dyspnea on exertion Active Problems:   Oxygen dependent   Hypertension   Emphysema of lung (HCC)   Chronic hypercapnic respiratory failure (HCC)  Dyspnea: Most likely secondary to severe emphysema.  Patient is on home oxygen.  Continue supplemental oxygen.  This morning she says she is feeling better.  Chest x-ray did not show any pneumonia. Continue bronchodilators as needed. CT chest with contrast done in early July this year did not reveal any pulmonary embolism. Will request for pulmonary evaluation if she continues to be dyspneic.We might  consider low dose morphine for chronic dyspnea. ABG done this morning showed hypoxia and hypercarbia. Needs to follow-up with pulmonology as an outpatient.Follows at [facility].  Lower extremity edema: Did not appreciate significant edema this morning.  Echocardiogram has been ordered.  We will follow-up.  Hypertension: Currently normotensive.  We will continue amlodipine  DVT prophylaxis: Lovenox Code Status: Full Family Communication: None present at the bedside Disposition Plan: Likely home tomorrow   Consultants: None  Procedures: None  Antimicrobials: None  Subjective: Patient seen and examined the bedside this morning.  She says she is feeling better.  Her respiratory status has improved.  Currently on oxygen.  Denies any cough,  Chest pain, fever ,chills.  Objective: Vitals:   07/04/18 0422 07/04/18 0843 07/04/18  0850 07/04/18 1306  BP: 116/80   (!) 114/55  Pulse: (!) 109   (!) 111  Resp: (!) 24   16  Temp: 97.9 F (36.6 C)   98.3 F (36.8 C)  TempSrc: Oral   Oral  SpO2: 95% 97% 97% 99%  Weight:      Height:        Intake/Output Summary (Last 24 hours) at 07/04/2018 1334 Last data filed at 07/04/2018 0930 Gross per 24 hour  Intake 440 ml  Output 50 ml  Net 390 ml   Filed Weights   07/03/18 1953  Weight: 46.1 kg (101 lb 11.2 oz)    Examination:  General exam: Not in distress,thin ,cachetic,elderly female HEENT:PERRL,Oral mucosa moist, Ear/Nose normal on gross exam Respiratory system: Bilateral decreased air entry  cardiovascular system:Sinus tachycardia, RRR. No JVD, murmurs, rubs, gallops or clicks. No pedal edema. Gastrointestinal system: Abdomen is nondistended, soft and nontender. No organomegaly or masses felt. Normal bowel sounds heard. Central nervous system: Alert and oriented. No focal neurological deficits. Extremities: No edema, no clubbing ,no cyanosis, distal peripheral pulses palpable. Skin: No rashes, lesions or ulcers,no icterus ,no pallor MSK: Normal muscle bulk,tone ,power Psychiatry: Judgement and insight appear normal. Mood & affect appropriate.     Data Reviewed: I have personally reviewed following labs and imaging studies  CBC: Recent Labs  Lab 07/01/18 2337 07/04/18 0014 07/04/18 0455  WBC 7.2 8.3 9.2  NEUTROABS 5.1 5.9  --   HGB 14.3 14.3 13.4  HCT 46.7* 46.4* 44.3  MCV 102.6* 99.8 101.6*  PLT 261 291 371   Basic Metabolic Panel: Recent Labs  Lab 07/01/18 2337 07/04/18 0014 07/04/18 0455  NA 140 138 136  K 4.3 4.1 3.8  CL 92* 92* 91*  CO2 34* 35* 34*  GLUCOSE 123* 123* 139*  BUN 13 10 11   CREATININE 0.38* 0.40* 0.49  CALCIUM 9.3 9.2 9.0   GFR: Estimated Creatinine Clearance: 34.7 mL/min (by C-G formula based on SCr of 0.49 mg/dL). Liver Function Tests: No results for input(s): AST, ALT, ALKPHOS, BILITOT, PROT, ALBUMIN in the last  168 hours. No results for input(s): LIPASE, AMYLASE in the last 168 hours. No results for input(s): AMMONIA in the last 168 hours. Coagulation Profile: No results for input(s): INR, PROTIME in the last 168 hours. Cardiac Enzymes: Recent Labs  Lab 07/04/18 0014  TROPONINI <0.03   BNP (last 3 results) No results for input(s): PROBNP in the last 8760 hours. HbA1C: No results for input(s): HGBA1C in the last 72 hours. CBG: No results for input(s): GLUCAP in the last 168 hours. Lipid Profile: No results for input(s): CHOL, HDL, LDLCALC, TRIG, CHOLHDL, LDLDIRECT in the last 72 hours. Thyroid Function Tests: No results for input(s): TSH, T4TOTAL, FREET4, T3FREE, THYROIDAB in the last 72 hours. Anemia Panel: No results for input(s): VITAMINB12, FOLATE, FERRITIN, TIBC, IRON, RETICCTPCT in the last 72 hours. Sepsis Labs: No results for input(s): PROCALCITON, LATICACIDVEN in the last 168 hours.  No results found for this or any previous visit (from the past 240 hour(s)).       Radiology Studies: Dg Chest 2 View  Result Date: 07/04/2018 CLINICAL DATA:  Shortness of breath EXAM: CHEST - 2 VIEW COMPARISON:  07/01/2018 FINDINGS: Lungs are clear.  No pleural effusion or pneumothorax. Right upper lobe scarring.  Emphysematous changes. The heart is normal in size. Mild degenerative changes of the visualized thoracolumbar spine. IMPRESSION: No evidence of acute cardiopulmonary disease. Electronically Signed   By: Julian Hy M.D.   On: 07/04/2018 02:22        Scheduled Meds: . amLODipine  5 mg Oral Daily  . enoxaparin (LOVENOX) injection  40 mg Subcutaneous Q24H  . feeding supplement (ENSURE ENLIVE)  237 mL Oral BID BM  . ipratropium-albuterol  3 mL Nebulization TID   Continuous Infusions:   LOS: 0 days    Time spent: 25 mins.More than 50% of that time was spent in counseling and/or coordination of care.      Shelly Coss, MD [facility] Pager  [phone]  If 7PM-7AM, please contact night-coverage [email or web address] Password [email or web address] 07/04/2018, 1:34 PM

## 2018-07-04 NOTE — ED Notes (Signed)
ED TO INPATIENT HANDOFF REPORT  Name/Age/Gender Sheila Mcconnell 84 y.o. female  Code Status    Code Status Orders  (From admission, onward)        Start     Ordered   07/04/18 0251  Full code  Continuous     07/04/18 0252    Code Status History    This patient has a current code status but no historical code status.    Advance Directive Documentation     Most Recent Value  Type of Advance Directive  Healthcare Power of Attorney  Pre-existing out of facility DNR order (yellow form or pink MOST form)  -  "MOST" Form in Place?  -      Home/SNF/Other Home  Chief Complaint SOB  Level of Care/Admitting Diagnosis ED Disposition    ED Disposition Condition [place]: [place] [100102]  Level of Care: Med-Surg [16]  Diagnosis: Emphysema of lung [facility]) [[id number]]  Admitting Physician: Hollice Gong, MIR [facility] [[id number]]  Attending Physician: Hollice Gong, MIR MOHAMMED [[id number]]  PT Class (Do Not Modify): Observation [104]  PT Acc Code (Do Not Modify): Observation [10022]       Medical History Past Medical History:  Diagnosis Date  . Allergy   . Cataracts, bilateral   . Dependence on supplemental oxygen    3.5l (pts stated 2.5 LNC), [facility]  . Emphysema of lung ([place])   . GERD (gastroesophageal reflux disease)   . Hyperlipidemia   . Hypertension   . Osteoporosis    had been on Boniva    Allergies Allergies  Allergen Reactions  . Sulfa Antibiotics Nausea Only    IV Location/Drains/Wounds Patient Lines/Drains/Airways Status   Active Line/Drains/Airways    Name:   Placement date:   Placement time:   Site:   Days:   Peripheral IV 07/04/18 Right;Lateral Forearm   07/04/18    0013    Forearm   less than 1          Labs/Imaging Results for orders placed or performed during the hospital encounter of 07/03/18 (from the past 48 hour(s))  Basic metabolic panel     Status: Abnormal   Collection Time: 07/04/18 12:14  AM  Result Value Ref Range   Sodium 138 135 - 145 mmol/L   Potassium 4.1 3.5 - 5.1 mmol/L   Chloride 92 (L) 98 - 111 mmol/L   CO2 35 (H) 22 - 32 mmol/L   Glucose, Bld 123 (H) 70 - 99 mg/dL   BUN 10 8 - 23 mg/dL   Creatinine, Ser 0.40 (L) 0.44 - 1.00 mg/dL   Calcium 9.2 8.9 - 10.3 mg/dL   GFR calc non Af Amer >60 >60 mL/min   GFR calc Af Amer >60 >60 mL/min    Comment: (NOTE) The eGFR has been calculated using the CKD EPI equation. This calculation has not been validated in all clinical situations. eGFR's persistently <60 mL/min signify possible Chronic Kidney Disease.    Anion gap 11 5 - 15    Comment: Performed at [facility], [place] [address], [place], [place] [address]  CBC with Differential     Status: Abnormal   Collection Time: 07/04/18 12:14 AM  Result Value Ref Range   WBC 8.3 4.0 - 10.5 K/uL   RBC 4.65 3.87 - 5.11 MIL/uL   Hemoglobin 14.3 12.0 - 15.0 g/dL   HCT 46.4 (H) 36.0 - 46.0 %   MCV 99.8 78.0 - 100.0 fL  MCH 30.8 26.0 - 34.0 pg   MCHC 30.8 30.0 - 36.0 g/dL   RDW 13.2 11.5 - 15.5 %   Platelets 291 150 - 400 K/uL   Neutrophils Relative % 70 %   Neutro Abs 5.9 1.7 - 7.7 K/uL   Lymphocytes Relative 14 %   Lymphs Abs 1.1 0.7 - 4.0 K/uL   Monocytes Relative 13 %   Monocytes Absolute 1.0 0.1 - 1.0 K/uL   Eosinophils Relative 3 %   Eosinophils Absolute 0.2 0.0 - 0.7 K/uL   Basophils Relative 0 %   Basophils Absolute 0.0 0.0 - 0.1 K/uL    Comment: Performed at [facility], [place] [address], [place], [place] [address]  Brain natriuretic peptide     Status: None   Collection Time: 07/04/18 12:14 AM  Result Value Ref Range   B Natriuretic Peptide 23.6 0.0 - 100.0 pg/mL    Comment: Performed at [facility], [place] [address], [place], [place] [address]  Troponin I     Status: None   Collection Time: 07/04/18 12:14 AM  Result Value Ref Range   Troponin I <0.03 <0.03 ng/mL    Comment: Performed at [facility], [place] [address], [place], [place] [address]  Blood gas, arterial ([facility] & [facility] ONLY)     Status: Abnormal   Collection Time: 07/04/18 12:50 AM  Result Value Ref Range   O2 Content 2.0 L/min   Delivery systems NASAL CANNULA    pH, Arterial 7.363 7.350 - 7.450   pCO2 arterial 66.4 (HH) 32.0 - 48.0 mmHg    Comment: CRITICAL RESULT CALLED TO, READ BACK BY AND VERIFIED WITH: DR NANAVATI AT 0105 BY ANNALISSA AGUSTIN,RRT,RCP ON 07/04/18    pO2, Arterial 73.3 (L) 83.0 - 108.0 mmHg   Bicarbonate 37.1 (H) 20.0 - 28.0 mmol/L   Acid-Base Excess 9.3 (H) 0.0 - 2.0 mmol/L   O2 Saturation 94.8 %   Patient temperature 97.5    Collection site RIGHT RADIAL    Drawn by [id number]    Sample type ARTERIAL    Allens test (pass/fail) PASS PASS    Comment: Performed at [facility], [place] [address], [place], [place] [address]   Dg Chest 2 View  Result Date: 07/04/2018 CLINICAL DATA:  Shortness of breath EXAM: CHEST - 2 VIEW COMPARISON:  07/01/2018 FINDINGS: Lungs are clear.  No pleural effusion or pneumothorax. Right upper lobe scarring.  Emphysematous changes. The heart is normal in size. Mild degenerative changes of the visualized thoracolumbar spine. IMPRESSION: No evidence of acute cardiopulmonary disease. Electronically Signed   By: Julian Hy M.D.   On: 07/04/2018 02:22    Pending Labs Unresulted Labs (From admission, onward)   Start     Ordered   07/11/18 0500  Creatinine, serum  (enoxaparin (LOVENOX)    CrCl >/= 30 ml/min)  Weekly,   R    Comments:  while on enoxaparin therapy    07/04/18 0252   07/04/18 2863  Basic metabolic panel  Tomorrow morning,   R     07/04/18 0252   07/04/18 0500  CBC  Tomorrow morning,   R     07/04/18 0252      Vitals/Pain Today's Vitals   07/04/18 0115 07/04/18 0130 07/04/18 0230 07/04/18 0300  BP:  135/86 129/75 107/61  Pulse: (!) 117  (!) 121 (!) 123  Resp:      Temp:      TempSrc:  SpO2: 100% 100% 99% 99%  Weight:       Height:      PainSc:        Isolation Precautions No active isolations  Medications Medications  amLODipine (NORVASC) tablet 5 mg (has no administration in time range)  ipratropium-albuterol (DUONEB) 0.5-2.5 (3) MG/3ML nebulizer solution 3 mL (has no administration in time range)  albuterol (PROVENTIL) (2.5 MG/3ML) 0.083% nebulizer solution 2.5 mg (has no administration in time range)  enoxaparin (LOVENOX) injection 40 mg (has no administration in time range)  acetaminophen (TYLENOL) tablet 650 mg (has no administration in time range)    Or  acetaminophen (TYLENOL) suppository 650 mg (has no administration in time range)  senna-docusate (Senokot-S) tablet 1 tablet (has no administration in time range)  ondansetron (ZOFRAN) tablet 4 mg (has no administration in time range)    Or  ondansetron (ZOFRAN) injection 4 mg (has no administration in time range)  ipratropium-albuterol (DUONEB) 0.5-2.5 (3) MG/3ML nebulizer solution 3 mL (3 mLs Nebulization Given 07/04/18 0053)  albuterol (PROVENTIL,VENTOLIN) solution continuous neb (10 mg/hr Nebulization Given 07/04/18 0053)    Mobility walks

## 2018-07-04 NOTE — ED Provider Notes (Signed)
[facility] DEPT Provider Note   CSN: [id number] Arrival date & time: 07/03/18  1943     History   Chief Complaint Chief Complaint  Patient presents with  . Shortness of Breath    HPI Sheila Mcconnell is a 84 y.o. female.  HPI 84 year old female with history of emphysema on 2 L nasal cannula 24/7, hypertension and hyperlipidemia comes in with chief complaint of worsening shortness of breath.  Patient states that she started getting short of breath about 5 days ago, today her shortness of breath was a lot more noticeable and so she decided to come to the ED.  Patient denies any new cough, new oxygen requirement, new chest pain or wheezing.  She reports transient relief when she takes breathing treatment.    Past Medical History:  Diagnosis Date  . Allergy   . Cataracts, bilateral   . Dependence on supplemental oxygen    3.5l (pts stated 2.5 LNC), [facility]  . Emphysema of lung ([place])   . GERD (gastroesophageal reflux disease)   . Hyperlipidemia   . Hypertension   . Osteoporosis    had been on Boniva    Patient Active Problem List   Diagnosis Date Noted  . Emphysema lung ([place]) 06/27/2017  . Oxygen dependent 06/27/2017  . Hypertension 06/27/2017    Past Surgical History:  Procedure Laterality Date  . NO PAST SURGERIES       OB History    Gravida  4   Para  4   Term      Preterm      AB      Living  4     SAB      TAB      Ectopic      Multiple      Live Births               Home Medications    Prior to Admission medications   Medication Sig Start Date End Date Taking? Authorizing Provider  amLODipine (NORVASC) 5 MG tablet Take 1 tablet (5 mg total) daily by mouth. 10/16/17  Yes Kuneff, Renee A, DO  budesonide-formoterol (SYMBICORT) 160-4.5 MCG/ACT inhaler Inhale 2 puffs into the lungs 2 (two) times daily. 05/03/18  Yes Juanito Doom, MD  ipratropium (ATROVENT HFA) 17 MCG/ACT inhaler Inhale 2 puffs  into the lungs every 6 (six) hours as needed. Patient taking differently: Inhale 2 puffs into the lungs every 6 (six) hours as needed for wheezing.  05/03/18  Yes Juanito Doom, MD  tiotropium (SPIRIVA) 18 MCG inhalation capsule Place 1 capsule (18 mcg total) into inhaler and inhale daily. 04/11/18  Yes Juanito Doom, MD    Family History Family History  Problem Relation Age of Onset  . Diabetes Mother   . Hearing loss Mother   . Lymphoma Mother     Social History Social History   Tobacco Use  . Smoking status: Former Smoker    Packs/day: 1.00    Years: 50.00    Pack years: 50.00    Types: Cigarettes  . Smokeless tobacco: Never Used  Substance Use Topics  . Alcohol use: Yes    Alcohol/week: 1.2 oz    Types: 2 Glasses of wine per week  . Drug use: No     Allergies   Sulfa antibiotics   Review of Systems Review of Systems  Constitutional: Positive for activity change.  Respiratory: Positive for shortness of breath.   Cardiovascular: Negative for  chest pain.  Neurological: Positive for dizziness.  All other systems reviewed and are negative.    Physical Exam Updated Vital Signs BP (!) 144/91   Pulse (!) 116   Temp 97.8 F (36.6 C) (Oral)   Resp 16   Ht 5\' 5"  (1.651 m)   Wt 46.1 kg (101 lb 11.2 oz)   SpO2 98%   BMI 16.92 kg/m   Physical Exam  Constitutional: She is oriented to person, place, and time. She appears well-developed.  HENT:  Head: Normocephalic and atraumatic.  Eyes: EOM are normal.  Neck: Normal range of motion. Neck supple.  Cardiovascular: Normal rate.  Pulmonary/Chest: Effort normal. She has no wheezes. She has no rhonchi.  Diminished breath sounds diffusely, no wheezing appreciated  Abdominal: Bowel sounds are normal.  Neurological: She is alert and oriented to person, place, and time.  Skin: Skin is warm and dry.  Nursing note and vitals reviewed.    ED Treatments / Results  Labs (all labs ordered are listed, but only  abnormal results are displayed) Labs Reviewed  CBC WITH DIFFERENTIAL/PLATELET - Abnormal; Notable for the following components:      Result Value   HCT 46.4 (*)    All other components within normal limits  BASIC METABOLIC PANEL  BRAIN NATRIURETIC PEPTIDE  TROPONIN I  BLOOD GAS, ARTERIAL    EKG EKG Interpretation  Date/Time:  Tuesday July 03 2018 19:57:29 EDT Ventricular Rate:  125 PR Interval:  112 QRS Duration: 94 QT Interval:  322 QTC Calculation: 464 R Axis:   97 Text Interpretation:  Sinus tachycardia Right atrial enlargement Pulmonary disease pattern Incomplete right bundle branch block Possible Right ventricular hypertrophy Abnormal ECG No acute changes Confirmed by Varney Biles [id number]) on 07/03/2018 11:41:50 PM   Radiology No results found.  Procedures Procedures (including critical care time)  Medications Ordered in ED Medications  ipratropium-albuterol (DUONEB) 0.5-2.5 (3) MG/3ML nebulizer solution 3 mL (has no administration in time range)  albuterol (PROVENTIL,VENTOLIN) solution continuous neb (has no administration in time range)     Initial Impression / Assessment and Plan / ED Course  I have reviewed the triage vital signs and the nursing notes.  Pertinent labs & imaging results that were available during my care of the patient were reviewed by me and considered in my medical decision making (see chart for details).    84 year old female comes in with chief complaint of shortness of breath.  Patient has history of severe emphysema and is on oxygen 24/7.  She reports that over the past 24 hours her breathing is noticeably gotten worse, now she is unable to get anything down without having to stop.  History is not suggestive of any infectious symptoms, as patient denies any new cough, fevers or chills.  She also denies any chest pain, leg swelling.  There is no history of PE-DVT and she had a CT PE done earlier in the month which was normal.  Patient is  noted to be tachycardic, with a heart rate at 115.  Given her age of 15, she is very close to her maximal pulse rate -and that is just at rest.  We will give patient nebulizer treatment and get an x-ray.  Patient likely will need admission for worsening chronic respiratory failure.  Besides infection, PE -pneumothorax, pulmonary hypertension and low effusion also considered in the differential diagnosis. Final Clinical Impressions(s) / ED Diagnoses   Final diagnoses:  COPD exacerbation ([place])  Chronic respiratory failure with hypercapnia ([place])  Tachycardia    ED Discharge Orders    None       Varney Biles, MD 07/04/18 [phone]

## 2018-07-04 NOTE — Progress Notes (Addendum)
Pt has new increased swelling to lle, new blister noted on l. Ankle. MD notified.

## 2018-07-04 NOTE — ED Notes (Signed)
Mary, pt's daughter, was notified of pt's admission and her room number.

## 2018-07-04 NOTE — ED Notes (Signed)
Pt unable to tolerate ambulation--- O2 sat dropped to 84% on O2 at 4L/min on ambulation from bed to room door.

## 2018-07-04 NOTE — Care Management Obs Status (Signed)
[facility] NOTIFICATION   Patient Details  Name: Sheila Mcconnell MRN: [id number] Date of Birth: [birth year]   Medicare Observation Status Notification Given:  Yes    Lynnell Catalan, RN 07/04/2018, 1:18 PM

## 2018-07-04 NOTE — H&P (Signed)
History and Physical  Sheila Mcconnell AYT:[id number] DOB: [birth year] DOA: 07/03/2018   PCP: Ma Hillock, DO   Patient coming from: Home   Chief Complaint: shortness of breath with exertion   HPI: Sheila Mcconnell is a 84 y.o. female with medical history significant for severe central emphysema chronically on 2L oxygen via nasal canula who is being admitted for subacute on chronic dyspnea on exertion. She says that over the last 2-4 weeks her breathing has gradually worsened, there was no chest pain, no sudden worsening. She denies cough, sputum production, fevers, chills. She has some new lower extremity edema that also started sometime in the last month.  Her breathing is not so bad she can't walk even a few steps to the bathroom without severe trouble breathing.  ED Course: Here her workup has been relatively unremarkable. Note CTPA protocol in early July without evidence of VTE, she had CXR today with no acute findings. Given severity of her dyspnea with exertion, hospitalist was asked to see the patient and consider hospital admission.  Review of Systems: Please see HPI for pertinent positives and negatives. A complete 10 system review of systems are otherwise negative.  Past Medical History:  Diagnosis Date  . Allergy   . Cataracts, bilateral   . Dependence on supplemental oxygen    3.5l (pts stated 2.5 LNC), [facility]  . Emphysema of lung ([place])   . GERD (gastroesophageal reflux disease)   . Hyperlipidemia   . Hypertension   . Osteoporosis    had been on Boniva   Past Surgical History:  Procedure Laterality Date  . NO PAST SURGERIES      Social History:  reports that she has quit smoking. Her smoking use included cigarettes. She has a 50.00 pack-year smoking history. She has never used smokeless tobacco. She reports that she drinks about 1.2 oz of alcohol per week. She reports that she does not use drugs.   Allergies  Allergen Reactions  . Sulfa Antibiotics Nausea  Only    Family History  Problem Relation Age of Onset  . Diabetes Mother   . Hearing loss Mother   . Lymphoma Mother      Prior to Admission medications   Medication Sig Start Date End Date Taking? Authorizing Provider  amLODipine (NORVASC) 5 MG tablet Take 1 tablet (5 mg total) daily by mouth. 10/16/17  Yes Kuneff, Renee A, DO  budesonide-formoterol (SYMBICORT) 160-4.5 MCG/ACT inhaler Inhale 2 puffs into the lungs 2 (two) times daily. 05/03/18  Yes Juanito Doom, MD  ipratropium (ATROVENT HFA) 17 MCG/ACT inhaler Inhale 2 puffs into the lungs every 6 (six) hours as needed. Patient taking differently: Inhale 2 puffs into the lungs every 6 (six) hours as needed for wheezing.  05/03/18  Yes Juanito Doom, MD  tiotropium (SPIRIVA) 18 MCG inhalation capsule Place 1 capsule (18 mcg total) into inhaler and inhale daily. 04/11/18  Yes Juanito Doom, MD    Physical Exam: BP 140/81   Pulse (!) 110   Temp 97.8 F (36.6 C) (Oral)   Resp 20   Ht 5\' 5"  (1.651 m)   Wt 46.1 kg (101 lb 11.2 oz)   SpO2 98%   BMI 16.92 kg/m   General:  Alert, oriented, calm, in no acute distress but getting short of breath speaking a few words  Eyes: EOMI, clear conjuctivae, white sclerea Neck: supple, no masses, trachea mildline  Cardiovascular: RRR, no murmurs or rubs, no peripheral edema  Respiratory: clear to  auscultation bilaterally but intermittent wheezes, no crackles, no retractions or tachypnea Abdomen: soft, nontender, nondistended, normal bowel tones heard  Skin: dry, no rashes  Musculoskeletal: no joint effusions, normal range of motion, she has 1+ pitting BLE edema Psychiatric: appropriate affect, normal speech  Neurologic: extraocular muscles intact, clear speech, moving all extremities with intact sensorium           Labs on Admission:  Basic Metabolic Panel: Recent Labs  Lab 07/01/18 2337 07/04/18 0014  NA 140 138  K 4.3 4.1  CL 92* 92*  CO2 34* 35*  GLUCOSE 123* 123*    BUN 13 10  CREATININE 0.38* 0.40*  CALCIUM 9.3 9.2   Liver Function Tests: No results for input(s): AST, ALT, ALKPHOS, BILITOT, PROT, ALBUMIN in the last 168 hours. No results for input(s): LIPASE, AMYLASE in the last 168 hours. No results for input(s): AMMONIA in the last 168 hours. CBC: Recent Labs  Lab 07/01/18 2337 07/04/18 0014  WBC 7.2 8.3  NEUTROABS 5.1 5.9  HGB 14.3 14.3  HCT 46.7* 46.4*  MCV 102.6* 99.8  PLT 261 291   Cardiac Enzymes: Recent Labs  Lab 07/04/18 0014  TROPONINI <0.03    BNP (last 3 results) Recent Labs    11/09/17 1511 06/04/18 1358 07/04/18 0014  BNP 10.4 24.4 23.6    ProBNP (last 3 results) No results for input(s): PROBNP in the last 8760 hours.  CBG: No results for input(s): GLUCAP in the last 168 hours.  Radiological Exams on Admission: Dg Chest 2 View  Result Date: 07/04/2018 CLINICAL DATA:  Shortness of breath EXAM: CHEST - 2 VIEW COMPARISON:  07/01/2018 FINDINGS: Lungs are clear.  No pleural effusion or pneumothorax. Right upper lobe scarring.  Emphysematous changes. The heart is normal in size. Mild degenerative changes of the visualized thoracolumbar spine. IMPRESSION: No evidence of acute cardiopulmonary disease. Electronically Signed   By: Julian Hy M.D.   On: 07/04/2018 02:22    Assessment/Plan Present on Admission: . Emphysema of lung ([place]) . Hypertension . Dyspnea on exertion . Chronic hypercapnic respiratory failure ([place])  84 year old female with history of severe emphysema on chronic 2L [place] oxygen and chronic hypercapneic respiratory failure being admitted with subacute worsening dyspnea on exertion. Differential includes worsening ephysema, right sided heart failure, pulmonary hypertension and less likely VTE. - observation admission - scheduled Duonebs and PRN albuterol - check Echo given new LE edema and evaluate for R sided heart failure/pHTN - consider Pulmonary consult in AM, she is due for 6 month  follow up soon anyway   Dyspnea on exertion Active Problems:   Oxygen dependent   Hypertension   Emphysema of lung (HCC)   Chronic hypercapnic respiratory failure (H[facility])   DVT prophylaxis: Lovenox   Code Status: FULL   Family Communication: None present.   Disposition Plan: Home at DC.   Consults called: None   Admission status: Obs   Time spent: 48 minutes  Mir Marry Guan MD [facility] Pager [phone]  If 7PM-7AM, please contact night-coverage [email or web address] Password [facility]  07/04/2018, 2:53 AM

## 2018-07-04 NOTE — ED Notes (Signed)
Pt's contact---- [name] (daughter): tel. # [phone] NOTE:  Please update daughter of pt's admission and room number when assigned.

## 2018-07-05 ENCOUNTER — Observation Stay (HOSPITAL_BASED_OUTPATIENT_CLINIC_OR_DEPARTMENT_OTHER): Payer: Medicare HMO

## 2018-07-05 DIAGNOSIS — J439 Emphysema, unspecified: Secondary | ICD-10-CM | POA: Diagnosis not present

## 2018-07-05 DIAGNOSIS — R0609 Other forms of dyspnea: Secondary | ICD-10-CM | POA: Diagnosis not present

## 2018-07-05 DIAGNOSIS — I361 Nonrheumatic tricuspid (valve) insufficiency: Secondary | ICD-10-CM

## 2018-07-05 DIAGNOSIS — E440 Moderate protein-calorie malnutrition: Secondary | ICD-10-CM

## 2018-07-05 DIAGNOSIS — Z515 Encounter for palliative care: Secondary | ICD-10-CM | POA: Diagnosis not present

## 2018-07-05 DIAGNOSIS — Z7189 Other specified counseling: Secondary | ICD-10-CM | POA: Diagnosis not present

## 2018-07-05 DIAGNOSIS — J432 Centrilobular emphysema: Secondary | ICD-10-CM

## 2018-07-05 LAB — ECHOCARDIOGRAM COMPLETE
Height: 65 in
Weight: 1627.2 oz

## 2018-07-05 MED ORDER — FUROSEMIDE 20 MG PO TABS
20.0000 mg | ORAL_TABLET | Freq: Every day | ORAL | Status: DC
  Administered 2018-07-05 – 2018-07-06 (×2): 20 mg via ORAL
  Filled 2018-07-05 (×2): qty 1

## 2018-07-05 MED ORDER — BOOST / RESOURCE BREEZE PO LIQD CUSTOM
1.0000 | Freq: Three times a day (TID) | ORAL | Status: DC
  Administered 2018-07-05 – 2018-07-06 (×2): 1 via ORAL

## 2018-07-05 NOTE — Progress Notes (Signed)
Pt's daughter spoke to RN and told RN that if PT does recommend rehab for pt. She would like the facility to be [facility] where pt currently resides.

## 2018-07-05 NOTE — Progress Notes (Signed)
Initial Nutrition Assessment  DOCUMENTATION CODES:   Non-severe (moderate) malnutrition in context of chronic illness, Underweight  INTERVENTION:   -D/c Ensure per patient request -Provide Boost Breeze po TID, each supplement provides 250 kcal and 9 grams of protein  NUTRITION DIAGNOSIS:   Moderate Malnutrition related to chronic illness(end stage emphysema) as evidenced by severe fat depletion, moderate muscle depletion.  GOAL:   Patient will meet greater than or equal to 90% of their needs  MONITOR:   PO intake, Supplement acceptance, Labs, Weight trends, I & O's  REASON FOR ASSESSMENT:   (Low BMI)   ASSESSMENT:   84 year old female with past medical history of severe central emphysema, on oxygen at home at 2 L/min who presented to the emergency department for worsening shortness of breath .   RD approached by RN stating pt does not like Ensure supplements and would prefer Boost Breeze supplements. RD changed order to TID.   Patient in room with daughter at bedside. Pt drinking a Boost Breeze at time of visit. Pt HOH. States she has a good appetite and wants to eat "what I want, when I want". Pt's daughter was not able to provide much else as far as nutrition history. Just states pt does not eat as well at home as she says she does.  Per chart review, weight is stable ~101-103 lb.   Labs reviewed. Medications:  Zofran tablet PRN  NUTRITION - FOCUSED PHYSICAL EXAM:    Most Recent Value  Orbital Region  Mild depletion  Upper Arm Region  Severe depletion  Thoracic and Lumbar Region  Unable to assess  Buccal Region  Mild depletion  Temple Region  Moderate depletion  Clavicle Bone Region  Moderate depletion  Clavicle and Acromion Bone Region  Moderate depletion  Scapular Bone Region  Unable to assess  Dorsal Hand  Moderate depletion  Patellar Region  Unable to assess  Anterior Thigh Region  Unable to assess  Posterior Calf Region  Moderate depletion  Edema (RD  Assessment)  Moderate       Diet Order:   Diet Order           Diet regular Room service appropriate? Yes; Fluid consistency: Thin  Diet effective now          EDUCATION NEEDS:   No education needs have been identified at this time  Skin:  Skin Assessment: Reviewed RN Assessment  Last BM:  7/30  Height:   Ht Readings from Last 1 Encounters:  07/03/18 5\' 5"  (1.651 m)    Weight:   Wt Readings from Last 1 Encounters:  07/03/18 101 lb 11.2 oz (46.1 kg)    Ideal Body Weight:  56.8 kg  BMI:  Body mass index is 16.92 kg/m.  Estimated Nutritional Needs:   Kcal:  1400-1600  Protein:  70-80g  Fluid:  1.6L/day   Clayton Bibles, MS, RD, LDN [facility] Pager: [phone] After Hours Pager: [phone]

## 2018-07-05 NOTE — Consult Note (Signed)
                                                                                 Consultation Note Date: 07/05/2018   Patient Name: Sheila Mcconnell  DOB: 03/03/1927  MRN: [id number]  Age / Sex: 84 y.o., female  PCP: Kuneff, Renee A, DO Referring Physician: Adhikari, Amrit, MD  Reason for Consultation: Establishing goals of care and Non pain symptom management  HPI/Patient Profile: 84 y.o. female  with past medical history of central emphysema on 2L oxygen at home, HTN, HLD, osteoporosis, hard of hearing, bilateral cataracts admitted on 07/03/2018 with worsening shortness of breath with exertion.   Clinical Assessment and Goals of Care: I met today with Sheila Mcconnell along with daughter, Sheila Mcconnell, and then joined by daughter, Sheila Mcconnell. We had a very direct discussion regarding natural progression and trajectory of emphysema (although she has done very well with lack of exacerbation). They all admit that she has had some gradual decline noted in increased shortness of breath with minimal activity. They have requested pulmonology to see her while she is here - I updated Dr. Adhikari of this request.   I discussed with Sheila Mcconnell who candidly shares that she would not want life support or resuscitation. She admits that she has thought about this although they have not spoken as a family. Daughters support her decision for DNR. They have had siblings (Ms. Harth's children) that have died expectantly in the past few years. We were able to discuss the use of morphine for shortness of breath and the use of hospice. We all felt confident that Sheila Mcconnell would know when it is time for these measures. I reassured them that my main point is that they know there are options to assist with QOL and support so that she does not suffer unnecessarily.   All family was very open to conversation. Sheila Mcconnell was very clear on her wishes however she also did not want to continue discussing this topic which I understand  (I did have to repeat when Sheila Mcconnell arrived). Sheila Mcconnell says that her mother does not like "to have a fuss made over her" - I explained that the hospital is the last place to be for this (I did get a few smiles during the conversation).   All questions/concerns addressed. Emotional support provided.   Primary Decision Maker PATIENT    SUMMARY OF RECOMMENDATIONS   - DNR - Does not believe she is in need of comfort meds for dyspnea at this time - Plans for f/u with pulmonary for recs  Code Status/Advance Care Planning:  DNR - wishes expressed today   Symptom Management:   Dyspnea: Explained the options of low dose morphine (even 1-2 mg po) for dyspnea when she feels this may benefit QOL. She is not interested at this time.   Malnutrition: Daughter expressed interest in considering appetite stimulant. Ms. Royall was not interested.   Palliative Prophylaxis:   Delirium Protocol  Additional Recommendations (Limitations, Scope, Preferences):  Avoid Hospitalization  Psycho-social/Spiritual:   Desire for further Chaplaincy support:no  Additional Recommendations: Caregiving  Support/Resources and Education on Hospice  Prognosis:   Overall prognosis poor with worsening emphysema   and advanced age.   Discharge Planning: Skilled Nursing Facility for rehab with Palliative care service follow-up      Primary Diagnoses: Present on Admission: . Emphysema of lung (H[facility]) . Hypertension . Dyspnea on exertion . Chronic hypercapnic respiratory failure ([facility])   I have reviewed the medical record, interviewed the patient and family, and examined the patient. The following aspects are pertinent.  Past Medical History:  Diagnosis Date  . Allergy   . Cataracts, bilateral   . Dependence on supplemental oxygen    3.5l (pts stated 2.5 LNC), [facility]  . Emphysema of lung ([facility])   . GERD (gastroesophageal reflux disease)   . Hyperlipidemia   . Hypertension   . Osteoporosis    had been  on Boniva   Social History   Socioeconomic History  . Marital status: Widowed    Spouse name: Not on file  . Number of children: Not on file  . Years of education: 13  . Highest education level: Not on file  Occupational History  . Occupation: retired  Social Needs  . Financial resource strain: Not on file  . Food insecurity:    Worry: Not on file    Inability: Not on file  . Transportation needs:    Medical: Not on file    Non-medical: Not on file  Tobacco Use  . Smoking status: Former Smoker    Packs/day: 1.00    Years: 50.00    Pack years: 50.00    Types: Cigarettes  . Smokeless tobacco: Never Used  Substance and Sexual Activity  . Alcohol use: Yes    Alcohol/week: 1.2 oz    Types: 2 Glasses of wine per week  . Drug use: No  . Sexual activity: Never  Lifestyle  . Physical activity:    Days per week: Not on file    Minutes per session: Not on file  . Stress: Not on file  Relationships  . Social connections:    Talks on phone: Not on file    Gets together: Not on file    Attends religious service: Not on file    Active member of club or organization: Not on file    Attends meetings of clubs or organizations: Not on file    Relationship status: Not on file  Other Topics Concern  . Not on file  Social History Narrative   Widow. Retired secretary. Some college.   Was living alone, now staying with her daughter for the past few weeks.   Drinks caffeine.   Wears her seatbelt. Smoke detector in the home   Wears a hearing aid.   Uses a scooter or wheelchair for distance.   Safe in her relationships.   Family History  Problem Relation Age of Onset  . Diabetes Mother   . Hearing loss Mother   . Lymphoma Mother    Scheduled Meds: . amLODipine  5 mg Oral Daily  . enoxaparin (LOVENOX) injection  40 mg Subcutaneous Q24H  . feeding supplement  1 Container Oral TID BM  . furosemide  20 mg Oral Daily  . ipratropium-albuterol  3 mL Nebulization TID   Continuous  Infusions: PRN Meds:.acetaminophen **OR** acetaminophen, albuterol, ondansetron **OR** ondansetron (ZOFRAN) IV, senna-docusate Allergies  Allergen Reactions  . Sulfa Antibiotics Nausea Only   Review of Systems  Constitutional: Positive for activity change, appetite change and fatigue.  Respiratory: Positive for shortness of breath.   Neurological: Positive for weakness.  Psychiatric/Behavioral: Negative for sleep disturbance.      Physical Exam  Constitutional: She is oriented to person, place, and time. She appears well-developed. She appears cachectic. She has a sickly appearance.  HENT:  Head: Normocephalic and atraumatic.  Cardiovascular: Tachycardia present.  Pulmonary/Chest: No accessory muscle usage. No tachypnea. [name] respiratory distress.  Abdominal: Normal appearance.  Neurological: She is alert and oriented to person, place, and time.  Nursing note and vitals reviewed.   Vital Signs: BP 118/61 (BP Location: Right Arm)   Pulse (!) 104   Temp 98.6 F (37 C) (Oral)   Resp 16   Ht 5' 5" (1.651 m)   Wt 46.1 kg (101 lb 11.2 oz)   SpO2 94%   BMI 16.92 kg/m  Pain Scale: 0-10   Pain Score: 0-No pain   SpO2: SpO2: 94 % O2 Device:SpO2: 94 % O2 Flow Rate: .O2 Flow Rate (L/min): 2 L/min  IO: Intake/output summary:   Intake/Output Summary (Last 24 hours) at 07/05/2018 1612 Last data filed at 07/05/2018 1300 Gross per 24 hour  Intake 240 ml  Output 250 ml  Net -10 ml    LBM: Last BM Date: 07/03/18(per pt ) Baseline Weight: Weight: 46.1 kg (101 lb 11.2 oz) Most recent weight: Weight: 46.1 kg (101 lb 11.2 oz)     Palliative Assessment/Data: 40%     Time Total: 50 min  Greater than 50%  of this time was spent counseling and coordinating care related to the above assessment and plan.  Signed by: Vinie Sill, NP Palliative Medicine Team Pager # [phone] (M-F 8a-5p) Team Phone # [phone] (Nights/Weekends)

## 2018-07-05 NOTE — Evaluation (Signed)
Physical Therapy Evaluation Patient Details Name: Sheila Mcconnell MRN: [id number] DOB: 09-Jan-1927 Today's Date: 07/05/2018   History of Present Illness  84 yo female admitted with dyspnea, weakness. Hx of emphysema, osteoporosis  Clinical Impression  On eval, pt required Min assist for mobility. She walked ~50 feet with a RW. Pt presents with general weakness, decreased activity tolerance, and impaired gait and balance. O2 sat 91% on 2L [place], HR 123 bpm, dyspnea 3/4 during ambulation. At baseline, pt is Ind with mobility. She was unable to safely complete any mobility tasks without assistance or an assistive device. Discussed d/c plan-pt does not feel she can safely manage at home alone. Recommend ST rehab at SNF prior to pt returning to her Ind living facility.    Follow Up Recommendations SNF    Equipment Recommendations  None recommended by PT    Recommendations for Other Services       Precautions / Restrictions Precautions Precautions: Fall Precaution Comments: O2 dep Restrictions Weight Bearing Restrictions: No      Mobility  Bed Mobility Overal bed mobility: Modified Independent                Transfers Overall transfer level: Needs assistance Equipment used: Rolling walker (2 wheeled);None Transfers: Sit to/from [employer] to Stand: Min assist Stand pivot transfers: Min assist       General transfer comment: Pt was unable to stand on 1st attempt without support. On 2nd attempt, used RW. Assist to rise, stabilize, control descent. Stand pivot, bed to bsc. VCs safety, technique, hand placement.   Ambulation/Gait Ambulation/Gait assistance: Min assist Gait Distance (Feet): 50 Feet Assistive device: Rolling walker (2 wheeled) Gait Pattern/deviations: Step-through pattern;Decreased stride length     General Gait Details: Unsteady. Assist required to stabilize throughout distance. VCs safety, proper use of RW. O2 sat 91& on 2L [place], HR 123 bpm  during ambulation. Dyspnea 3/4.   Stairs            Wheelchair Mobility    Modified Rankin (Stroke Patients Only)       Balance Overall balance assessment: Needs assistance         Standing balance support: Bilateral upper extremity supported Standing balance-Leahy Scale: Poor                               Pertinent Vitals/Pain Pain Assessment: No/denies pain    Home Living Family/patient expects to be discharged to:: Private residence Living Arrangements: Alone   Type of Home: Independent living facility Home Access: Level entry     Home Layout: One level Home Equipment: Walker - 2 wheels;Cane - single point      Prior Function Level of Independence: Independent               Hand Dominance        Extremity/Trunk Assessment   Upper Extremity Assessment Upper Extremity Assessment: Generalized weakness    Lower Extremity Assessment Lower Extremity Assessment: Generalized weakness    Cervical / Trunk Assessment Cervical / Trunk Assessment: Kyphotic  Communication   Communication: HOH  Cognition Arousal/Alertness: Awake/alert Behavior During Therapy: WFL for tasks assessed/performed Overall Cognitive Status: Within Functional Limits for tasks assessed                                        General Comments  Exercises     Assessment/Plan    PT Assessment Patient needs continued PT services  PT Problem List Decreased balance;Decreased mobility;Decreased activity tolerance;Decreased strength       PT Treatment Interventions DME instruction;Gait training;Functional mobility training;Therapeutic activities;Balance training;Patient/family education;Therapeutic exercise    PT Goals (Current goals can be found in the Care Plan section)  Acute Rehab PT Goals Patient Stated Goal: to regain independence PT Goal Formulation: With patient/family Time For Goal Achievement: 07/19/18 Potential to Achieve  Goals: Good    Frequency Min 3X/week   Barriers to discharge        Co-evaluation               AM-PAC PT "6 Clicks" Daily Activity  Outcome Measure Difficulty turning over in bed (including adjusting bedclothes, sheets and blankets)?: A Little Difficulty moving from lying on back to sitting on the side of the bed? : A Little Difficulty sitting down on and standing up from a chair with arms (e.g., wheelchair, bedside commode, etc,.)?: Unable Help needed moving to and from a bed to chair (including a wheelchair)?: A Little Help needed walking in hospital room?: A Little Help needed climbing 3-5 steps with a railing? : A Lot 6 Click Score: 15    End of Session Equipment Utilized During Treatment: Gait belt;Oxygen Activity Tolerance: Patient limited by fatigue Patient left: in bed;with call bell/phone within reach;with bed alarm set;with family/visitor present   PT Visit Diagnosis: Muscle weakness (generalized) (M62.81);Difficulty in walking, not elsewhere classified (R26.2)    Time: 1350-1409 PT Time Calculation (min) (ACUTE ONLY): 19 min   Charges:   PT Evaluation $PT Eval Moderate Complexity: 1 Mod          Weston Anna, MPT Pager: [phone]

## 2018-07-05 NOTE — Progress Notes (Signed)
PROGRESS NOTE    Sheila Mcconnell  ZOX:[id number] DOB: 10/19/1927 DOA: 07/03/2018 PCP: Ma Hillock, DO   Brief Narrative: Patient is 84 year old female with past medical history of severe central emphysema, on oxygen at home at 2 L/min who presented to the emergency department for worsening shortness of breath .  She denied any cough, fever or chills on presentation.  Patient also reported of new bilateral lower extremity edema.  Admitted for further evaluation.  Assessment & Plan:   Principal Problem:   Dyspnea on exertion Active Problems:   Oxygen dependent   Hypertension   Emphysema of lung (HCC)   Chronic hypercapnic respiratory failure (HCC)   Malnutrition of moderate degree  Dyspnea: Most likely secondary to severe emphysema.  Patient is on home oxygen.  Continue supplemental oxygen.  This morning she says she is feeling better.  Chest x-ray did not show any pneumonia. Continue bronchodilators as needed. CT chest with contrast done in early July this year did not reveal any pulmonary embolism. Will request for pulmonary evaluation if she continues to be dyspneic.We might  consider low dose morphine for chronic dyspnea. ABG had shown showed hypoxia and hypercarbia. Needs to follow-up with pulmonology as an outpatient.Follows at [facility]. I have requested palliative care consult for chronic dyspnea, multiple comorbidities and discussion for goals of care.  Lower extremity edema: Mild edema on bilateral lower extremities.  Echocardiogram showed ejection fraction of 65 to 70%, mild LVH, grade 1 diastolic dysfunction.  We will start her on Lasix 20 mg daily.  Hypertension: Currently normotensive.  We will continue amlodipine  Debility/deconditioning: Patient evaluated by physical therapy and recommended skilled nursing facility on discharge.  Social worker consulted.  DVT prophylaxis: Lovenox Code Status: Full Family Communication: Daughter present at the  bedside Disposition Plan: SNF  Consultants: None  Procedures: None  Antimicrobials: None  Subjective: Patient seen and examined the bedside this morning.  Looks comfortable.  Does not have any new complaints.  No new issues/events. Objective: Vitals:   07/04/18 2140 07/05/18 0616 07/05/18 0755 07/05/18 1015  BP: (!) 99/52 (!) 108/56  124/72  Pulse: 99 (!) 101    Resp: 14 16    Temp: 97.7 F (36.5 C) 98.1 F (36.7 C)    TempSrc: Oral Oral    SpO2: 100% 99% 95%   Weight:      Height:        Intake/Output Summary (Last 24 hours) at 07/05/2018 1435 Last data filed at 07/04/2018 1516 Gross per 24 hour  Intake 240 ml  Output 300 ml  Net -60 ml   Filed Weights   07/03/18 1953  Weight: 46.1 kg (101 lb 11.2 oz)    Examination:  General exam: Not in distress,thin ,cachetic,elderly female HEENT:PERRL,Oral mucosa moist, Ear/Nose normal on gross exam Respiratory system: Bilateral decreased air entry  cardiovascular system:Sinus tachycardia, RRR. No JVD, murmurs, rubs, gallops or clicks.  Bilateral lower extremity edema Gastrointestinal system: Abdomen is nondistended, soft and nontender. No organomegaly or masses felt. Normal bowel sounds heard. Central nervous system: Alert and oriented. No focal neurological deficits. Extremities: Bilateral lower extremity edema, no clubbing ,no cyanosis, distal peripheral pulses palpable. Skin: No rashes, lesions or ulcers,no icterus ,no pallor MSK: Normal muscle bulk,tone ,power Psychiatry: Judgement and insight appear normal. Mood & affect appropriate.     Data Reviewed: I have personally reviewed following labs and imaging studies  CBC: Recent Labs  Lab 07/01/18 2337 07/04/18 0014 07/04/18 0455  WBC 7.2 8.3 9.2  NEUTROABS 5.1 5.9  --   HGB 14.3 14.3 13.4  HCT 46.7* 46.4* 44.3  MCV 102.6* 99.8 101.6*  PLT 261 291 791   Basic Metabolic Panel: Recent Labs  Lab 07/01/18 2337 07/04/18 0014 07/04/18 0455  NA 140 138 136  K  4.3 4.1 3.8  CL 92* 92* 91*  CO2 34* 35* 34*  GLUCOSE 123* 123* 139*  BUN 13 10 11   CREATININE 0.38* 0.40* 0.49  CALCIUM 9.3 9.2 9.0   GFR: Estimated Creatinine Clearance: 34.7 mL/min (by C-G formula based on SCr of 0.49 mg/dL). Liver Function Tests: No results for input(s): AST, ALT, ALKPHOS, BILITOT, PROT, ALBUMIN in the last 168 hours. No results for input(s): LIPASE, AMYLASE in the last 168 hours. No results for input(s): AMMONIA in the last 168 hours. Coagulation Profile: No results for input(s): INR, PROTIME in the last 168 hours. Cardiac Enzymes: Recent Labs  Lab 07/04/18 0014  TROPONINI <0.03   BNP (last 3 results) No results for input(s): PROBNP in the last 8760 hours. HbA1C: No results for input(s): HGBA1C in the last 72 hours. CBG: No results for input(s): GLUCAP in the last 168 hours. Lipid Profile: No results for input(s): CHOL, HDL, LDLCALC, TRIG, CHOLHDL, LDLDIRECT in the last 72 hours. Thyroid Function Tests: No results for input(s): TSH, T4TOTAL, FREET4, T3FREE, THYROIDAB in the last 72 hours. Anemia Panel: No results for input(s): VITAMINB12, FOLATE, FERRITIN, TIBC, IRON, RETICCTPCT in the last 72 hours. Sepsis Labs: No results for input(s): PROCALCITON, LATICACIDVEN in the last 168 hours.  No results found for this or any previous visit (from the past 240 hour(s)).       Radiology Studies: Dg Chest 2 View  Result Date: 07/04/2018 CLINICAL DATA:  Shortness of breath EXAM: CHEST - 2 VIEW COMPARISON:  07/01/2018 FINDINGS: Lungs are clear.  No pleural effusion or pneumothorax. Right upper lobe scarring.  Emphysematous changes. The heart is normal in size. Mild degenerative changes of the visualized thoracolumbar spine. IMPRESSION: No evidence of acute cardiopulmonary disease. Electronically Signed   By: Julian Hy M.D.   On: 07/04/2018 02:22        Scheduled Meds: . amLODipine  5 mg Oral Daily  . enoxaparin (LOVENOX) injection  40 mg  Subcutaneous Q24H  . feeding supplement  1 Container Oral TID BM  . furosemide  20 mg Oral Daily  . ipratropium-albuterol  3 mL Nebulization TID   Continuous Infusions:   LOS: 0 days    Time spent: 25 mins.More than 50% of that time was spent in counseling and/or coordination of care.      Shelly Coss, MD [facility] Pager [phone]  If 7PM-7AM, please contact night-coverage [email or web address] Password [email or web address] 07/05/2018, 2:35 PM

## 2018-07-05 NOTE — Progress Notes (Signed)
  Echocardiogram 2D Echocardiogram has been performed.  Sheila Mcconnell 07/05/2018, 9:07 AM

## 2018-07-06 DIAGNOSIS — M2550 Pain in unspecified joint: Secondary | ICD-10-CM | POA: Diagnosis not present

## 2018-07-06 DIAGNOSIS — R0609 Other forms of dyspnea: Secondary | ICD-10-CM | POA: Diagnosis not present

## 2018-07-06 DIAGNOSIS — J439 Emphysema, unspecified: Secondary | ICD-10-CM | POA: Diagnosis not present

## 2018-07-06 DIAGNOSIS — Z7401 Bed confinement status: Secondary | ICD-10-CM | POA: Diagnosis not present

## 2018-07-06 MED ORDER — BUDESONIDE-FORMOTEROL FUMARATE 160-4.5 MCG/ACT IN AERO: 2.0000 | INHALATION_SPRAY | Freq: Two times a day (BID) | RESPIRATORY_TRACT | 0 refills | Status: DC

## 2018-07-06 MED ORDER — FUROSEMIDE 20 MG PO TABS: 20.0000 mg | ORAL_TABLET | Freq: Every day | ORAL | 0 refills | Status: DC

## 2018-07-06 MED ORDER — TIOTROPIUM BROMIDE MONOHYDRATE 18 MCG IN CAPS: 18.0000 ug | ORAL_CAPSULE | Freq: Every day | RESPIRATORY_TRACT | 0 refills | Status: DC

## 2018-07-06 MED ORDER — IPRATROPIUM BROMIDE HFA 17 MCG/ACT IN AERS: 2.0000 | INHALATION_SPRAY | Freq: Four times a day (QID) | RESPIRATORY_TRACT | 0 refills | Status: DC | PRN

## 2018-07-06 MED ORDER — IPRATROPIUM-ALBUTEROL 0.5-2.5 (3) MG/3ML IN SOLN: 3.0000 mL | Freq: Three times a day (TID) | RESPIRATORY_TRACT | 0 refills | Status: AC

## 2018-07-06 NOTE — Progress Notes (Signed)
Pt transported via [facility], daughter at bedside with pt at time of discharge. Belongings sent with pt.

## 2018-07-06 NOTE — Progress Notes (Signed)
Palliative Medicine RN Note: Rec'd call from pt's daughter Liona Wengert [phone]). I spoke with pt's SW, who has already started [facility] & will call the family.  Marjie Skiff Ashleymarie Granderson, RN, BSN, [facility] Palliative Medicine Team 07/06/2018 12:50 PM Office [phone]

## 2018-07-06 NOTE — Discharge Summary (Signed)
Physician Discharge Summary  Sheila Mcconnell ZOX:[id number] DOB: 1927-02-12 DOA: 07/03/2018  PCP: Ma Hillock, DO  Admit date: 07/03/2018 Discharge date: 07/06/2018  Admitted From: Home Disposition:  Home  Discharge Condition:Stable CODE STATUS: DNR Diet recommendation: Heart Healthy  Brief/Interim Summary:  Patient is 84 year old female with past medical history of severe central emphysema, on oxygen at home at 2 L/min who presented to the emergency department for worsening shortness of breath .  She denied any cough, fever or chills on presentation.  Patient also reported of new bilateral lower extremity edema.  Admitted for further evaluation. Patient's hospital course remained stable.Chest x-ray did not show any pneumonia or pleural effusion.  Her dyspnea was secondary to her chronic emphysema. Her respiratory status continued to improve.  Echocardiogram showed normal left ventricular systolic function grade 1 diastolic dysfunction.  Patient eval by physical therapy and recommended skilled nursing facility on discharge but she denied.  She will follow-up with pulmonology as an outpatient after discharge.  She will be discharged to assisted living facility today.  Following problems were addressed during her hospitalization:  Dyspnea: Secondary to chronic severe emphysema.  Patient is on home oxygen.  Continue supplemental oxygen.  She feels better.  Chest x-ray did not show any pneumonia. Continue bronchodilators as needed. CT chest with contrast done in early July this year did not reveal any pulmonary embolism. Will request for pulmonary evaluation if she continues to be dyspneic. ABG had shown showed hypoxia and hypercarbia. Needs to follow-up with pulmonology as an outpatient.Follows at [facility]. Palliative care consulted for multiple comorbidities and discussion for goals of care.  She is DNR now.  Lower extremity edema: Mild edema on distal left lower extremity.  Most  likely this is myxedema rather than fluid .There is no inflammation,tenderness on the lower extremities.  Echocardiogram showed ejection fraction of 65 to 70%, mild LVH, grade 1 diastolic dysfunction.  We have started her on Lasix 20 mg daily.  Hypertension: Currently normotensive.  We will continue amlodipine  Debility/deconditioning: Patient evaluated by physical therapy and recommended skilled nursing facility on discharge.  Social worker consulted.But she declined so she is being discharged back to assisted living facility.    Discharge Diagnoses:  Principal Problem:   Dyspnea on exertion Active Problems:   Oxygen dependent   Hypertension   Emphysema of lung (HCC)   Chronic hypercapnic respiratory failure (HCC)   Malnutrition of moderate degree   Goals of care, counseling/discussion   Palliative care encounter    Discharge Instructions  Discharge Instructions    Ambulatory referral to Pulmonology   Complete by:  As directed    Appointment in a week   Diet - low sodium heart healthy   Complete by:  As directed    Discharge instructions   Complete by:  As directed    1) Please continue to take your inhalers at home. 2)Take prescribed medications as instructed. 3) Follow up with your PCP in a week. 4) Follow up with pulmonology as an outpatient in a week.  Name and number of the provider has been attached.   Increase activity slowly   Complete by:  As directed      Allergies as of 07/06/2018      Reactions   Sulfa Antibiotics Nausea Only      Medication List    TAKE these medications   amLODipine 5 MG tablet Commonly known as:  NORVASC Take 1 tablet (5 mg total) daily by mouth.   budesonide-formoterol 160-4.5  MCG/ACT inhaler Commonly known as:  SYMBICORT Inhale 2 puffs into the lungs 2 (two) times daily.   furosemide 20 MG tablet Commonly known as:  LASIX Take 1 tablet (20 mg total) by mouth daily. Start taking on:  07/07/2018   ipratropium 17 MCG/ACT  inhaler Commonly known as:  ATROVENT HFA Inhale 2 puffs into the lungs every 6 (six) hours as needed. What changed:  reasons to take this   ipratropium-albuterol 0.5-2.5 (3) MG/3ML Soln Commonly known as:  DUONEB Take 3 mLs by nebulization 3 (three) times daily.   tiotropium 18 MCG inhalation capsule Commonly known as:  SPIRIVA Place 1 capsule (18 mcg total) into inhaler and inhale daily.            Durable Medical Equipment  (From admission, onward)        Start     Ordered   07/06/18 1421  For home use only DME Nebulizer machine  Once    Question:  Patient needs a nebulizer to treat with the following condition  Answer:  COPD (chronic obstructive pulmonary disease) ([place])   07/06/18 1421      Contact information for follow-up providers    Kuneff, Renee A, DO. Schedule an appointment as soon as possible for a visit in 1 week(s).   Specialty:  Family Medicine Contact information: [address] [place] [address] [phone]        Juanito Doom, MD. Schedule an appointment as soon as possible for a visit in 1 week(s).   Specialty:  Pulmonary Disease Contact information: [place] [address] [phone]            Contact information for after-discharge care    Destination    [facility] Preferred SNF .   Service:  Skilled Nursing Contact information: [address] [place] [phone]                 Allergies  Allergen Reactions  . Sulfa Antibiotics Nausea Only    Consultations: None  Procedures/Studies: Dg Chest 2 View  Result Date: 07/04/2018 CLINICAL DATA:  Shortness of breath EXAM: CHEST - 2 VIEW COMPARISON:  07/01/2018 FINDINGS: Lungs are clear.  No pleural effusion or pneumothorax. Right upper lobe scarring.  Emphysematous changes. The heart is normal in size. Mild degenerative changes of the visualized thoracolumbar spine. IMPRESSION: No evidence of acute cardiopulmonary  disease. Electronically Signed   By: Julian Hy M.D.   On: 07/04/2018 02:22   Dg Chest 2 View  Result Date: 07/02/2018 CLINICAL DATA:  Worsening shortness of breath. Similar symptoms 1 month ago. Oxygen dependent, history of emphysema. EXAM: CHEST - 2 VIEW COMPARISON:  CT chest June 04, 2018 and chest radiograph June 04, 2018 FINDINGS: Cardiomediastinal silhouette is normal. Calcified aortic knob. Chronic interstitial changes increased lung volumes with flattened hemidiaphragms. Scattered granulomas. No pleural effusion or focal consolidation. No pneumothorax. Osteopenia. Mild old upper thoracic compression fracture. IMPRESSION: COPD.  No focal consolidation. Aortic Atherosclerosis (ICD10-I70.0). Electronically Signed   By: Elon Alas M.D.   On: 07/02/2018 00:10       Subjective: Patient seen and examined the bedside this morning.  Remains comfortable.  Shortness of breath is at baseline.  No new issues.  Discharge Exam: Vitals:   07/06/18 0850 07/06/18 0853  BP:    Pulse:    Resp:    Temp:    SpO2: 98% 98%   Vitals:   07/05/18 2015 07/06/18 0406  07/06/18 0850 07/06/18 0853  BP: 120/61 (!) 118/57    Pulse: 100 98    Resp: 12 18    Temp: 98.4 F (36.9 C) 98.4 F (36.9 C)    TempSrc: Oral Oral    SpO2: 99% 98% 98% 98%  Weight:      Height:        General: Pt is alert, awake, not in acute distress, very frail, cachectic elderly lady Cardiovascular: RRR, S1/S2 +, no rubs, no gallops Respiratory: Decreased air entry B/L Abdominal: Soft, NT, ND, bowel sounds + Extremities: no edema, no cyanosis    The results of significant diagnostics from this hospitalization (including imaging, microbiology, ancillary and laboratory) are listed below for reference.     Microbiology: No results found for this or any previous visit (from the past 240 hour(s)).   Labs: BNP (last 3 results) Recent Labs    11/09/17 1511 06/04/18 1358 07/04/18 0014  BNP 10.4 24.4 96.2    Basic Metabolic Panel: Recent Labs  Lab 07/01/18 2337 07/04/18 0014 07/04/18 0455  NA 140 138 136  K 4.3 4.1 3.8  CL 92* 92* 91*  CO2 34* 35* 34*  GLUCOSE 123* 123* 139*  BUN 13 10 11   CREATININE 0.38* 0.40* 0.49  CALCIUM 9.3 9.2 9.0   Liver Function Tests: No results for input(s): AST, ALT, ALKPHOS, BILITOT, PROT, ALBUMIN in the last 168 hours. No results for input(s): LIPASE, AMYLASE in the last 168 hours. No results for input(s): AMMONIA in the last 168 hours. CBC: Recent Labs  Lab 07/01/18 2337 07/04/18 0014 07/04/18 0455  WBC 7.2 8.3 9.2  NEUTROABS 5.1 5.9  --   HGB 14.3 14.3 13.4  HCT 46.7* 46.4* 44.3  MCV 102.6* 99.8 101.6*  PLT 261 291 257   Cardiac Enzymes: Recent Labs  Lab 07/04/18 0014  TROPONINI <0.03   BNP: Invalid input(s): POCBNP CBG: No results for input(s): GLUCAP in the last 168 hours. D-Dimer No results for input(s): DDIMER in the last 72 hours. Hgb A1c No results for input(s): HGBA1C in the last 72 hours. Lipid Profile No results for input(s): CHOL, HDL, LDLCALC, TRIG, CHOLHDL, LDLDIRECT in the last 72 hours. Thyroid function studies No results for input(s): TSH, T4TOTAL, T3FREE, THYROIDAB in the last 72 hours.  Invalid input(s): FREET3 Anemia work up No results for input(s): VITAMINB12, FOLATE, FERRITIN, TIBC, IRON, RETICCTPCT in the last 72 hours. Urinalysis No results found for: COLORURINE, APPEARANCEUR, LABSPEC, [place], GLUCOSEU, HGBUR, BILIRUBINUR, KETONESUR, PROTEINUR, UROBILINOGEN, NITRITE, LEUKOCYTESUR Sepsis Labs Invalid input(s): PROCALCITONIN,  WBC,  LACTICIDVEN Microbiology No results found for this or any previous visit (from the past 240 hour(s)).  Please note: You were cared for by a hospitalist during your hospital stay. Once you are discharged, your primary care physician will handle any further medical issues. Please note that NO REFILLS for any discharge medications will be authorized once you are discharged, as  it is imperative that you return to your primary care physician (or establish a relationship with a primary care physician if you do not have one) for your post hospital discharge needs so that they can reassess your need for medications and monitor your lab values.    Time coordinating discharge: 40 minutes  SIGNED:   Shelly Coss, MD  [facility] 07/06/2018, 2:21 PM Pager [phone]  If 7PM-7AM, please contact night-coverage [email or web address] Password [email or web address]

## 2018-07-06 NOTE — NC FL2 (Addendum)
[place] LEVEL OF CARE SCREENING TOOL     IDENTIFICATION  Patient Name: Sheila Mcconnell Birthdate: 11-02-1927 Sex: female Admission Date (Current Location): 07/03/2018  [facility] and [place] Number:  Herbalist and Address:  [facility],  [place] [address], [place]      Provider Number: [id number]  Attending Physician Name and Address:  Shelly Coss, MD  Relative Name and Phone Number:       Current Level of Care: Hospital Recommended Level of Care: Assisted living facility Prior Approval Number:    Date Approved/Denied:   PASRR Number: [id number]  Discharge Plan: ALF    Current Diagnoses: Patient Active Problem List   Diagnosis Date Noted  . Malnutrition of moderate degree 07/05/2018  . Goals of care, counseling/discussion   . Palliative care encounter   . Emphysema of lung ([place]) 07/04/2018  . Dyspnea on exertion 07/04/2018  . Chronic hypercapnic respiratory failure ([place]) 07/04/2018  . Emphysema lung ([place]) 06/27/2017  . Oxygen dependent 06/27/2017  . Hypertension 06/27/2017    Orientation RESPIRATION BLADDER Height & Weight     Self, Time, Situation, Place  O2 Continent Weight: 101 lb 11.2 oz (46.1 kg) Height:  5\' 5"  (165.1 cm)  BEHAVIORAL SYMPTOMS/MOOD NEUROLOGICAL BOWEL NUTRITION STATUS        Diet(regular)  AMBULATORY STATUS COMMUNICATION OF NEEDS Skin   Limited Assist Verbally Normal                       Personal Care Assistance Level of Assistance  Bathing, Feeding, Dressing Bathing Assistance: Maximum assistance Feeding assistance: Independent Dressing Assistance: Maximum assistance     Functional Limitations Info  Sight, Hearing, Speech Sight Info: Impaired Hearing Info: Impaired Speech Info: Adequate    SPECIAL CARE FACTORS FREQUENCY  PT (By licensed PT), OT (By licensed OT)     HHPT HHOT            Contractures Contractures Info: Not present    Additional Factors Info  Code  Status, Allergies Code Status Info: DNR Allergies Info: Sulfa Antibiotics           Current Medications (07/06/2018):  This is the current hospital active medication list Current Facility-Administered Medications  Medication Dose Route Frequency Provider Last Rate Last Dose  . acetaminophen (TYLENOL) tablet 650 mg  650 mg Oral Q6H PRN Hollice Gong, Mir Earlie Server, MD       Or  . acetaminophen (TYLENOL) suppository 650 mg  650 mg Rectal Q6H PRN Hollice Gong, Mir Mohammed, MD      . albuterol (PROVENTIL) (2.5 MG/3ML) 0.083% nebulizer solution 2.5 mg  2.5 mg Nebulization Q2H PRN Hollice Gong, Mir Mohammed, MD      . amLODipine (NORVASC) tablet 5 mg  5 mg Oral Daily Hollice Gong, Mir Mohammed, MD   5 mg at 07/05/18 1015  . enoxaparin (LOVENOX) injection 40 mg  40 mg Subcutaneous Q24H Hollice Gong, Mir Mohammed, MD   40 mg at 07/05/18 1015  . feeding supplement (BOOST / RESOURCE BREEZE) liquid 1 Container  1 Container Oral TID BM Shelly Coss, MD   1 Container at 07/05/18 1340  . furosemide (LASIX) tablet 20 mg  20 mg Oral Daily Shelly Coss, MD   20 mg at 07/05/18 1339  . ipratropium-albuterol (DUONEB) 0.5-2.5 (3) MG/3ML nebulizer solution 3 mL  3 mL Nebulization TID Tomma Rakers, MD   3 mL at 07/06/18 0850  . ondansetron (ZOFRAN) tablet 4 mg  4 mg Oral Q6H  PRN Tomma Rakers, MD   4 mg at 07/04/18 1158   Or  . ondansetron (ZOFRAN) injection 4 mg  4 mg Intravenous Q6H PRN Hollice Gong, Mir Mohammed, MD      . senna-docusate (Senokot-S) tablet 1 tablet  1 tablet Oral QHS PRN Tomma Rakers, MD         Discharge Medications: Please see discharge summary for a list of discharge medications.  Relevant Imaging Results:  Relevant Lab Results:   Additional Information SSN [id number]  Burnis Medin, LCSW

## 2018-07-06 NOTE — Progress Notes (Signed)
PROGRESS NOTE    Sheila Mcconnell  ZCH:[id number] DOB: 1927/02/20 DOA: 07/03/2018 PCP: Ma Hillock, DO   Brief Narrative: Patient is 84 year old female with past medical history of severe central emphysema, on oxygen at home at 2 L/min who presented to the emergency department for worsening shortness of breath .  She denied any cough, fever or chills on presentation.  Patient also reported of new bilateral lower extremity edema.  Admitted for further evaluation. Patient's hospital course remained stable.Chest x-ray did not show any pneumonia or pleural effusion.  Her dyspnea was secondary to her chronic emphysema. Her respiratory status continued to improve.  Echocardiogram showed normal left ventricular systolic function grade 1 diastolic dysfunction.  Patient eval by physical therapy and recommended skilled nursing facility on discharge.  She will follow-up with pulmonology as an outpatient after discharge.  Assessment & Plan:   Principal Problem:   Dyspnea on exertion Active Problems:   Oxygen dependent   Hypertension   Emphysema of lung (HCC)   Chronic hypercapnic respiratory failure (HCC)   Malnutrition of moderate degree   Goals of care, counseling/discussion   Palliative care encounter  Dyspnea: Secondary to chronic severe emphysema.  Patient is on home oxygen.  Continue supplemental oxygen.  She feels better.  Chest x-ray did not show any pneumonia. Continue bronchodilators as needed. CT chest with contrast done in early July this year did not reveal any pulmonary embolism. Will request for pulmonary evaluation if she continues to be dyspneic. ABG had shown showed hypoxia and hypercarbia. Needs to follow-up with pulmonology as an outpatient.Follows at [facility]. Palliative care consulted for multiple comorbidities and discussion for goals of care.  She is DNR now.  Lower extremity edema: Mild edema on distal left lower extremity.  Most likely this is myxedema rather  than fluid .There is no inflammation,tenderness on the lower extremities.  Echocardiogram showed ejection fraction of 65 to 70%, mild LVH, grade 1 diastolic dysfunction.  We have started her on Lasix 20 mg daily.  Hypertension: Currently normotensive.  We will continue amlodipine  Debility/deconditioning: Patient evaluated by physical therapy and recommended skilled nursing facility on discharge.  Social worker consulted.  DVT prophylaxis: Lovenox Code Status: Full Family Communication: Discussed with the daughter at the bedside yesterday. Disposition Plan: SNF  Consultants: None  Procedures: None  Antimicrobials: None  Subjective: Patient seen and examined the bedside this morning.  Looks comfortable.  Does not have any new complaints.  No new issues/events. Objective: Vitals:   07/05/18 2015 07/06/18 0406 07/06/18 0850 07/06/18 0853  BP: 120/61 (!) 118/57    Pulse: 100 98    Resp: 12 18    Temp: 98.4 F (36.9 C) 98.4 F (36.9 C)    TempSrc: Oral Oral    SpO2: 99% 98% 98% 98%  Weight:      Height:        Intake/Output Summary (Last 24 hours) at 07/06/2018 1157 Last data filed at 07/06/2018 2878 Gross per 24 hour  Intake 540 ml  Output 675 ml  Net -135 ml   Filed Weights   07/03/18 1953  Weight: 46.1 kg (101 lb 11.2 oz)    Examination:  General exam: Not in distress,thin ,cachetic,elderly female HEENT:PERRL,Oral mucosa moist, Ear/Nose normal on gross exam Respiratory system: Bilateral severely  decreased air entry  cardiovascular system:Sinus tachycardia, RRR. No JVD, murmurs, rubs, gallops or clicks.   Gastrointestinal system: Abdomen is nondistended, soft and nontender. No organomegaly or masses felt. Normal bowel sounds heard. Central  nervous system: Alert and oriented. No focal neurological deficits. Extremities: Left distal lower extremity pitting edema, no clubbing ,no cyanosis, distal peripheral pulses palpable. Skin: No rashes, lesions or ulcers,no icterus  ,no pallor MSK: Normal muscle bulk,tone ,power Psychiatry: Judgement and insight appear normal. Mood & affect appropriate.     Data Reviewed: I have personally reviewed following labs and imaging studies  CBC: Recent Labs  Lab 07/01/18 2337 07/04/18 0014 07/04/18 0455  WBC 7.2 8.3 9.2  NEUTROABS 5.1 5.9  --   HGB 14.3 14.3 13.4  HCT 46.7* 46.4* 44.3  MCV 102.6* 99.8 101.6*  PLT 261 291 767   Basic Metabolic Panel: Recent Labs  Lab 07/01/18 2337 07/04/18 0014 07/04/18 0455  NA 140 138 136  K 4.3 4.1 3.8  CL 92* 92* 91*  CO2 34* 35* 34*  GLUCOSE 123* 123* 139*  BUN 13 10 11   CREATININE 0.38* 0.40* 0.49  CALCIUM 9.3 9.2 9.0   GFR: Estimated Creatinine Clearance: 34.7 mL/min (by C-G formula based on SCr of 0.49 mg/dL). Liver Function Tests: No results for input(s): AST, ALT, ALKPHOS, BILITOT, PROT, ALBUMIN in the last 168 hours. No results for input(s): LIPASE, AMYLASE in the last 168 hours. No results for input(s): AMMONIA in the last 168 hours. Coagulation Profile: No results for input(s): INR, PROTIME in the last 168 hours. Cardiac Enzymes: Recent Labs  Lab 07/04/18 0014  TROPONINI <0.03   BNP (last 3 results) No results for input(s): PROBNP in the last 8760 hours. HbA1C: No results for input(s): HGBA1C in the last 72 hours. CBG: No results for input(s): GLUCAP in the last 168 hours. Lipid Profile: No results for input(s): CHOL, HDL, LDLCALC, TRIG, CHOLHDL, LDLDIRECT in the last 72 hours. Thyroid Function Tests: No results for input(s): TSH, T4TOTAL, FREET4, T3FREE, THYROIDAB in the last 72 hours. Anemia Panel: No results for input(s): VITAMINB12, FOLATE, FERRITIN, TIBC, IRON, RETICCTPCT in the last 72 hours. Sepsis Labs: No results for input(s): PROCALCITON, LATICACIDVEN in the last 168 hours.  No results found for this or any previous visit (from the past 240 hour(s)).       Radiology Studies: No results found.      Scheduled Meds: .  amLODipine  5 mg Oral Daily  . enoxaparin (LOVENOX) injection  40 mg Subcutaneous Q24H  . feeding supplement  1 Container Oral TID BM  . furosemide  20 mg Oral Daily  . ipratropium-albuterol  3 mL Nebulization TID   Continuous Infusions:   LOS: 0 days    Time spent: 25 mins.More than 50% of that time was spent in counseling and/or coordination of care.      Shelly Coss, MD [facility] Pager [phone]  If 7PM-7AM, please contact night-coverage [email or web address] Password [facility] 07/06/2018, 11:57 AM

## 2018-07-06 NOTE — Progress Notes (Signed)
Patient to dc back to [facility].   LCSW confirmed return with facility. Patient level of care is changing. Patient will go to ALF.   LCSW faxed dc docs to facility.   Family notified of transport.   RN report number: [place] [phone]  Servando Snare, [place] [phone]

## 2018-07-06 NOTE — Clinical Social Work Note (Signed)
Clinical Social Work Assessment  Patient Details  Name: Sheila Mcconnell MRN: [id number] Date of Birth: [birth year]  Date of referral:  07/06/18               Reason for consult:  Facility Placement, Discharge Planning                Permission sought to share information with:  Case Manager, Customer service manager, Family Supports Permission granted to share information::  Yes, Verbal Permission Granted  Name::     [name] and Associate Professor::  SNF  Relationship::  Daughter (s)  Sport and exercise psychologist Information:     Housing/Transportation Living arrangements for the past 2 months:  [place] of Information:  Patient, Adult Children, Facility Patient Interpreter Needed:  None Criminal Activity/Legal Involvement Pertinent to Current Situation/Hospitalization:  No - Comment as needed Significant Relationships:  Adult Children Lives with:  Self, Facility Resident Do you feel safe going back to the place where you live?  Yes Need for family participation in patient care:  Yes (Comment)  Care giving concerns:  No care giving concerns at the time of assessment.    Social Worker assessment / plan:  LCSW Patient admitted for Dyspnea on exertion.   LCSW met with patient at bedside. No supports present. Patient reports she is from independent living at [facility] and independent in ADLs.   Patient has [employer] [facility] not have a Surveyor, minerals. Family prefers to change patients level of care to ALF with home health.   PLAN: ALF [facility] will look in to Home health and set up for patient if it is available. LCSW discussed with patient's daughter Sheila Mcconnell.    Employment status:  Retired Nurse, adult PT Recommendations:  [place] / Referral to community resources:     Patient/Family's Response to care:  Family is anxious for patient to dc.   Patient/Family's  Understanding of and Emotional Response to Diagnosis, Current Treatment, and Prognosis: Daughter Sheila Mcconnell ask a lot of questions about level of care. SNF, vs, ALF, vs back to independent with Home health. LCSW answered questions.   Emotional Assessment Appearance:  Appears stated age Attitude/Demeanor/Rapport:    Affect (typically observed):  Calm Orientation:  Oriented to Self, Oriented to Place, Oriented to  Time, Oriented to Situation Alcohol / Substance use:  Not Applicable Psych involvement (Current and /or in the community):  No (Comment)  Discharge Needs  Concerns to be addressed:    Readmission within the last 30 days:  No Current discharge risk:  None Barriers to Discharge:  [place], LCSW 07/06/2018, 12:44 PM

## 2018-07-09 ENCOUNTER — Telehealth: Payer: Self-pay | Admitting: Family Medicine

## 2018-07-09 NOTE — Telephone Encounter (Signed)
Copied from [place] [id number]. Topic: Quick Communication - See Telephone Encounter >> Jul 09, 2018 11:53 AM Ahmed Prima L wrote: CRM for notification. See Telephone encounter for: 07/09/18.  Lavella Lemons from [facility] called and said they received a fax from [facility] and it said that she will be discharging from the facility. She is requesting that she go out and see the patient for physical therapy and occupational therapy evaluation. Call back @ [phone].

## 2018-07-09 NOTE — Telephone Encounter (Signed)
Spoke with Sheila Mcconnell let her know we cannot order Home Health for this patient until she is seen by Dr Raoul Pitch we have not seen her since last year . She will let patient know she needs appt with Dr Raoul Pitch.

## 2018-07-10 ENCOUNTER — Telehealth: Payer: Self-pay

## 2018-07-10 ENCOUNTER — Ambulatory Visit: Payer: Medicare HMO | Admitting: Pulmonary Disease

## 2018-07-10 NOTE — Telephone Encounter (Signed)
Spoke with patients daughter, Sheila Mcconnell. Patient resides in [place].   Transition Care Management Follow-up Telephone Call  Admit date: 07/03/2018 Discharge date: 07/06/2018  Principal Problem:   Dyspnea on exertion   How have you been since you were released from the hospital? "She's doing good, still winded with walking a lot"   Do you understand why you were in the hospital? yes   Do you understand the discharge instructions? Daughter did not review discharge instructions.    Where were you discharged to? ALF   Items Reviewed:  Medications reviewed: no, ALF manages medications  Allergies reviewed: yes  Dietary changes reviewed: yes  Referrals reviewed: yes   Functional Questionnaire:   Activities of Daily Living (ADLs):   She states they are independent in the following: ambulation, bathing and hygiene, feeding, continence, grooming, toileting and dressing. Daughter states pt can feed herself, ambulates, toilets. Assistant is present with bathing.  States they require assistance with the following: None   Any transportation issues/concerns?: no   Any patient concerns? no   Confirmed importance and date/time of follow-up visits scheduled yes  Daughter declines to make f/u appt with PCP at this time, will call back to schedule.   Confirmed with patient if condition begins to worsen call PCP or go to the ER.  Patient was given the office number and encouraged to call back with question or concerns.  : yes

## 2018-07-19 DIAGNOSIS — I4510 Unspecified right bundle-branch block: Secondary | ICD-10-CM | POA: Diagnosis not present

## 2018-07-19 DIAGNOSIS — I959 Hypotension, unspecified: Secondary | ICD-10-CM | POA: Diagnosis not present

## 2018-07-19 DIAGNOSIS — R0602 Shortness of breath: Secondary | ICD-10-CM | POA: Diagnosis not present

## 2018-07-19 DIAGNOSIS — R000 Tachycardia, unspecified: Secondary | ICD-10-CM | POA: Diagnosis not present

## 2018-07-19 DIAGNOSIS — I10 Essential (primary) hypertension: Secondary | ICD-10-CM | POA: Diagnosis not present

## 2018-07-20 ENCOUNTER — Encounter (HOSPITAL_COMMUNITY): Payer: Self-pay | Admitting: Emergency Medicine

## 2018-07-20 ENCOUNTER — Other Ambulatory Visit: Payer: Self-pay

## 2018-07-20 ENCOUNTER — Emergency Department (HOSPITAL_COMMUNITY): Payer: Medicare HMO

## 2018-07-20 ENCOUNTER — Emergency Department (HOSPITAL_COMMUNITY)
Admission: EM | Admit: 2018-07-20 | Discharge: 2018-07-20 | Disposition: A | Discharge status: Home / Self Care | Payer: Medicare HMO | Attending: Emergency Medicine | Admitting: Emergency Medicine

## 2018-07-20 DIAGNOSIS — Z87891 Personal history of nicotine dependence: Secondary | ICD-10-CM | POA: Insufficient documentation

## 2018-07-20 DIAGNOSIS — Z7401 Bed confinement status: Secondary | ICD-10-CM | POA: Diagnosis not present

## 2018-07-20 DIAGNOSIS — I10 Essential (primary) hypertension: Secondary | ICD-10-CM | POA: Insufficient documentation

## 2018-07-20 DIAGNOSIS — J441 Chronic obstructive pulmonary disease with (acute) exacerbation: Secondary | ICD-10-CM | POA: Insufficient documentation

## 2018-07-20 DIAGNOSIS — I959 Hypotension, unspecified: Secondary | ICD-10-CM | POA: Diagnosis not present

## 2018-07-20 DIAGNOSIS — M2550 Pain in unspecified joint: Secondary | ICD-10-CM | POA: Diagnosis not present

## 2018-07-20 DIAGNOSIS — Z79899 Other long term (current) drug therapy: Secondary | ICD-10-CM | POA: Diagnosis not present

## 2018-07-20 DIAGNOSIS — R0602 Shortness of breath: Secondary | ICD-10-CM | POA: Diagnosis present

## 2018-07-20 LAB — CBC WITH DIFFERENTIAL/PLATELET
Abs Immature Granulocytes: 0.1 10*3/uL (ref 0.0–0.1)
BASOS PCT: 0 %
Basophils Absolute: 0.1 10*3/uL (ref 0.0–0.1)
Eosinophils Absolute: 0.2 10*3/uL (ref 0.0–0.7)
Eosinophils Relative: 1 %
HEMATOCRIT: 44.9 % (ref 36.0–46.0)
HEMOGLOBIN: 13.5 g/dL (ref 12.0–15.0)
IMMATURE GRANULOCYTES: 0 %
Lymphocytes Relative: 15 %
Lymphs Abs: 2 10*3/uL (ref 0.7–4.0)
MCH: 30.8 pg (ref 26.0–34.0)
MCHC: 30.1 g/dL (ref 30.0–36.0)
MCV: 102.3 fL — AB (ref 78.0–100.0)
MONO ABS: 1.2 10*3/uL — AB (ref 0.1–1.0)
Monocytes Relative: 9 %
NEUTROS ABS: 10.2 10*3/uL — AB (ref 1.7–7.7)
NEUTROS PCT: 75 %
Platelets: 303 10*3/uL (ref 150–400)
RBC: 4.39 MIL/uL (ref 3.87–5.11)
RDW: 12.4 % (ref 11.5–15.5)
WBC: 13.8 10*3/uL — ABNORMAL HIGH (ref 4.0–10.5)

## 2018-07-20 LAB — I-STAT TROPONIN, ED: Troponin i, poc: 0.01 ng/mL (ref 0.00–0.08)

## 2018-07-20 LAB — BASIC METABOLIC PANEL
ANION GAP: 15 (ref 5–15)
BUN: 8 mg/dL (ref 8–23)
CHLORIDE: 89 mmol/L — AB (ref 98–111)
CO2: 32 mmol/L (ref 22–32)
Calcium: 9.2 mg/dL (ref 8.9–10.3)
Creatinine, Ser: 0.47 mg/dL (ref 0.44–1.00)
GFR calc non Af Amer: >60 mL/min (ref >60)
GLUCOSE: 117 mg/dL — AB (ref 70–99)
Potassium: 3.5 mmol/L (ref 3.5–5.1)
Sodium: 136 mmol/L (ref 135–145)

## 2018-07-20 MED ORDER — PREDNISONE 20 MG PO TABS: ORAL_TABLET | ORAL | 0 refills | Status: DC

## 2018-07-20 MED ORDER — IPRATROPIUM-ALBUTEROL 0.5-2.5 (3) MG/3ML IN SOLN
3.0000 mL | RESPIRATORY_TRACT | Status: AC
  Administered 2018-07-20 (×2): 3 mL via RESPIRATORY_TRACT
  Filled 2018-07-20: qty 6

## 2018-07-20 MED ORDER — METHYLPREDNISOLONE SODIUM SUCC 125 MG IJ SOLR: 125.0000 mg | Freq: Once | INTRAMUSCULAR | Status: DC

## 2018-07-20 NOTE — ED Notes (Signed)
[facility] called  

## 2018-07-20 NOTE — ED Notes (Signed)
Reviewed d/c instructions with pt and daughter, both of whom voiced understanding and had no outstanding questions. Report given to RN at [facility], as well as [facility] crew transporting her. Pt departed in NAD.

## 2018-07-20 NOTE — Discharge Instructions (Signed)
Use your duoneb every 4 hours while awake, return for sudden worsening shortness of breath, or if you need to use your breathing treatment more often.

## 2018-07-20 NOTE — ED Notes (Signed)
ED Provider at bedside. 

## 2018-07-20 NOTE — ED Triage Notes (Signed)
Pt BIB EMS from [facility], reporting SOB beginning an hour ago. Given 5mg  albuterol by facility staff, with no improvement. Lung sounds extremely diminished for EMS. Given 5mg  albuterol, 0.5mg  atrovent, 125mg  solu-medrol, and 2mg  magnesium sulfate by EMS. Pt continues to have diminished lung sounds and speaking in short sentences. On 2Lpm via [place] at facility. Increased to 4Lpm by EMS. Also reports generalized abd pain for 2 days. Undiagnosed dementia per facility staff

## 2018-07-20 NOTE — ED Provider Notes (Signed)
[facility] EMERGENCY DEPARTMENT Provider Note   CSN: [id number] Arrival date & time: 07/20/18  0054     History   Chief Complaint Chief Complaint  Patient presents with  . Shortness of Breath    HPI Sheila Mcconnell is a 84 y.o. female.  84 yo F with a chief complaint of shortness of breath.  Per EMS they thought it was due to the length of her oxygen tubing.  She is on 2 L at all times at home.  She currently denies any symptoms.  Per EMS she had some abdominal tenderness.  She denies chest pain denies cough denies sputum production.  The history is provided by the patient.  Shortness of Breath  This is a new problem. The average episode lasts 2 days. The problem occurs rarely.The current episode started less than 1 hour ago. The problem has not changed since onset.Pertinent negatives include no fever, no headaches, no rhinorrhea, no wheezing, no chest pain and no vomiting. She has tried nothing for the symptoms. The treatment provided no relief. She has had no prior hospitalizations. She has had no prior ED visits. Associated medical issues include COPD.    Past Medical History:  Diagnosis Date  . Allergy   . Cataracts, bilateral   . Dependence on supplemental oxygen    3.5l (pts stated 2.5 LNC), [employer] services  . Emphysema of lung ([place])   . GERD (gastroesophageal reflux disease)   . Hyperlipidemia   . Hypertension   . Osteoporosis    had been on Boniva    Patient Active Problem List   Diagnosis Date Noted  . Malnutrition of moderate degree 07/05/2018  . Goals of care, counseling/discussion   . Palliative care encounter   . Emphysema of lung ([place]) 07/04/2018  . Dyspnea on exertion 07/04/2018  . Chronic hypercapnic respiratory failure ([place]) 07/04/2018  . Emphysema lung ([place]) 06/27/2017  . Oxygen dependent 06/27/2017  . Hypertension 06/27/2017    Past Surgical History:  Procedure Laterality Date  . NO PAST SURGERIES       OB History    Gravida  4   Para  4   Term      Preterm      AB      Living  4     SAB      TAB      Ectopic      Multiple      Live Births               Home Medications    Prior to Admission medications   Medication Sig Start Date End Date Taking? Authorizing Provider  amLODipine (NORVASC) 5 MG tablet Take 1 tablet (5 mg total) daily by mouth. 10/16/17   Kuneff, Renee A, DO  budesonide-formoterol (SYMBICORT) 160-4.5 MCG/ACT inhaler Inhale 2 puffs into the lungs 2 (two) times daily. 07/06/18   Shelly Coss, MD  furosemide (LASIX) 20 MG tablet Take 1 tablet (20 mg total) by mouth daily. 07/07/18   Shelly Coss, MD  ipratropium (ATROVENT HFA) 17 MCG/ACT inhaler Inhale 2 puffs into the lungs every 6 (six) hours as needed. 07/06/18   Shelly Coss, MD  ipratropium-albuterol (DUONEB) 0.5-2.5 (3) MG/3ML SOLN Take 3 mLs by nebulization 3 (three) times daily. 07/06/18   Shelly Coss, MD  predniSONE (DELTASONE) 20 MG tablet 2 tabs po daily x 4 days 07/20/18   Deno Etienne, DO  tiotropium (SPIRIVA) 18 MCG inhalation capsule Place 1 capsule (18 mcg total)  into inhaler and inhale daily. 07/06/18   Shelly Coss, MD    Family History Family History  Problem Relation Age of Onset  . Diabetes Mother   . Hearing loss Mother   . Lymphoma Mother     Social History Social History   Tobacco Use  . Smoking status: Former Smoker    Packs/day: 1.00    Years: 50.00    Pack years: 50.00    Types: Cigarettes  . Smokeless tobacco: Never Used  Substance Use Topics  . Alcohol use: Yes    Alcohol/week: 2.0 standard drinks    Types: 2 Glasses of wine per week  . Drug use: No     Allergies   Sulfa antibiotics   Review of Systems Review of Systems  Constitutional: Negative for chills and fever.  HENT: Negative for congestion and rhinorrhea.   Eyes: Negative for redness and visual disturbance.  Respiratory: Positive for shortness of breath. Negative for wheezing.   Cardiovascular:  Negative for chest pain and palpitations.  Gastrointestinal: Negative for nausea and vomiting.  Genitourinary: Negative for dysuria and urgency.  Musculoskeletal: Negative for arthralgias and myalgias.  Skin: Negative for pallor and wound.  Neurological: Negative for dizziness and headaches.     Physical Exam Updated Vital Signs BP (!) 156/99   Pulse (!) 115   Temp 97.7 F (36.5 C) (Oral)   Resp (!) 21   Ht 5\' 5"  (1.651 m)   Wt 45.8 kg   SpO2 100%   BMI 16.80 kg/m   Physical Exam  Constitutional: She is oriented to person, place, and time. She appears well-developed and well-nourished. No distress.  Cachectic  HENT:  Head: Normocephalic and atraumatic.  Eyes: Pupils are equal, round, and reactive to light. EOM are normal.  Neck: Normal range of motion. Neck supple.  Cardiovascular: Normal rate and regular rhythm. Exam reveals no gallop and no friction rub.  No murmur heard. Pulmonary/Chest: Effort normal. She has decreased breath sounds ( in all fields). She has no wheezes. She has no rales.  Barrel chest  Abdominal: Soft. She exhibits no distension. There is no tenderness.  Musculoskeletal: She exhibits no edema or tenderness.  Neurological: She is alert and oriented to person, place, and time.  Skin: Skin is warm and dry. She is not diaphoretic.  Psychiatric: She has a normal mood and affect. Her behavior is normal.  Nursing note and vitals reviewed.    ED Treatments / Results  Labs (all labs ordered are listed, but only abnormal results are displayed) Labs Reviewed  CBC WITH DIFFERENTIAL/PLATELET - Abnormal; Notable for the following components:      Result Value   WBC 13.8 (*)    MCV 102.3 (*)    Neutro Abs 10.2 (*)    Monocytes Absolute 1.2 (*)    All other components within normal limits  BASIC METABOLIC PANEL - Abnormal; Notable for the following components:   Chloride 89 (*)    Glucose, Bld 117 (*)    All other components within normal limits  I-STAT  TROPONIN, ED    EKG EKG Interpretation  Date/Time:  Friday July 20 2018 01:07:35 EDT Ventricular Rate:  114 PR Interval:    QRS Duration: 108 QT Interval:  344 QTC Calculation: 474 R Axis:   104 Text Interpretation:  Sinus tachycardia Ventricular premature complex Aberrant conduction of SV complex(es) Biatrial enlargement Probable inferior infarct, old Probable lateral infarct, old Baseline wander in lead(s) I III aVL No significant change since last  tracing Confirmed by Deno Etienne [phone]) on 07/20/2018 1:17:01 AM   Radiology Dg Chest Port 1 View  Result Date: 07/20/2018 CLINICAL DATA:  Shortness of breath EXAM: PORTABLE CHEST 1 VIEW COMPARISON:  07/04/2018 FINDINGS: Cardiac shadow is stable. Aortic calcifications are stable. Calcified granuloma is noted in the right lung base. No focal infiltrate or sizable effusion is seen. Hyperinflation is noted consistent with COPD. No acute bony abnormality is noted. IMPRESSION: COPD and prior granulomatous disease. Electronically Signed   By: Inez Catalina M.D.   On: 07/20/2018 01:14    Procedures Procedures (including critical care time)  Medications Ordered in ED Medications  ipratropium-albuterol (DUONEB) 0.5-2.5 (3) MG/3ML nebulizer solution 3 mL ( Nebulization Canceled Entry 07/20/18 0126)     Initial Impression / Assessment and Plan / ED Course  I have reviewed the triage vital signs and the nursing notes.  Pertinent labs & imaging results that were available during my care of the patient were reviewed by me and considered in my medical decision making (see chart for details).     84 yo F with a chief complaint of shortness of breath.  Patient denies any complaints on my exam.  Diminished breath sounds in all fields.  We will give 3 duo nebs back-to-back steroids.  Chest x-ray without focal infiltrate as viewed by me.  The patient states that she feels much better on reassessment.  She still is moving minimal air.  I feel this is  likely her baseline.  She is requesting discharge I will put her go home.  She has a mild leukocytosis, troponin negative EKG unremarkable, metabolic panel with mild hyperglycemia at 117, she is hypochloremic which is been a chronic problem for her.  The family was requesting a different inhaled medicine that may make her less anxious.  They felt that it made her heart race and a bit twitchy being on an increased dose of ipratropium/albuterol.  Suggested they discuss this with their pulmonologist.  2:18 AM:  I have discussed the diagnosis/risks/treatment options with the patient and family and believe the pt to be eligible for discharge home to follow-up with PCP, pulm. We also discussed returning to the ED immediately if new or worsening sx occur. We discussed the sx which are most concerning (e.g., sudden worsening pain, fever, inability to tolerate by mouth) that necessitate immediate return. Medications administered to the patient during their visit and any new prescriptions provided to the patient are listed below.  Medications given during this visit Medications  ipratropium-albuterol (DUONEB) 0.5-2.5 (3) MG/3ML nebulizer solution 3 mL ( Nebulization Canceled Entry 07/20/18 0126)     The patient appears reasonably screen and/or stabilized for discharge and I doubt any other medical condition or other [facility] requiring further screening, evaluation, or treatment in the ED at this time prior to discharge.    Final Clinical Impressions(s) / ED Diagnoses   Final diagnoses:  COPD exacerbation [facility])    ED Discharge Orders         Ordered    predniSONE (DELTASONE) 20 MG tablet     07/20/18 0214           Deno Etienne, DO 07/20/18 [phone]

## 2018-07-26 ENCOUNTER — Encounter: Payer: Self-pay | Admitting: Pulmonary Disease

## 2018-07-26 ENCOUNTER — Ambulatory Visit (INDEPENDENT_AMBULATORY_CARE_PROVIDER_SITE_OTHER): Payer: Medicare HMO | Admitting: Pulmonary Disease

## 2018-07-26 VITALS — BP 116/72 | HR 114 | Ht 65.0 in | Wt 96.0 lb

## 2018-07-26 DIAGNOSIS — J9611 Chronic respiratory failure with hypoxia: Secondary | ICD-10-CM | POA: Diagnosis not present

## 2018-07-26 DIAGNOSIS — J432 Centrilobular emphysema: Secondary | ICD-10-CM | POA: Diagnosis not present

## 2018-07-26 DIAGNOSIS — R29898 Other symptoms and signs involving the musculoskeletal system: Secondary | ICD-10-CM

## 2018-07-26 DIAGNOSIS — J42 Unspecified chronic bronchitis: Secondary | ICD-10-CM

## 2018-07-26 NOTE — Patient Instructions (Signed)
Muscular deconditioning: I will prescribe physical therapy 3 years assisted living facility  Severe COPD: Take Symbicort 2 puffs twice a day Take Spiriva daily Change DuoNeb to 3 times a day as needed for shortness of breath, not routinely High-dose flu shot when available Stay active Practice good hand hygiene  Chronic respiratory failure with hypoxemia: Continue 2 L of oxygen continuously  We will see you back in 4 to 6 months or sooner if needed

## 2018-07-26 NOTE — Progress Notes (Signed)
Subjective:   PATIENT ID: Sheila Mcconnell GENDER: female DOB: 1927-04-05, MRN: [id number]  Synopsis: Referred in Dec 2018 for COPD  HPI  Chief Complaint  Patient presents with  . Follow-up    Patient was recently placed into an assisted living facility. Per the daughter, she wants to make sure her current regimen is working.    Sheila Mcconnell returns today for follow-up for her severe COPD.  She was hospitalized recently for some shortness of breath and apparently was just observed overnight and then released.  Since then she has been in the assisted living portion of her facility.  She says that the care there has been excellent.  She has been receiving albuterol 3 times a day which she says makes her feel a little bit more jittery.  She continues to take Symbicort 2 puffs twice a day and Spiriva daily.  She says that her short of breath is "pretty good for her".  She does not produce mucus every day.  She continues to use and benefit from 2 L of oxygen continuously.   Past Medical History:  Diagnosis Date  . Allergy   . Cataracts, bilateral   . Dependence on supplemental oxygen    3.5l (pts stated 2.5 LNC), [facility]  . Emphysema of lung ([place])   . GERD (gastroesophageal reflux disease)   . Hyperlipidemia   . Hypertension   . Osteoporosis    had been on Boniva     Family History  Problem Relation Age of Onset  . Diabetes Mother   . Hearing loss Mother   . Lymphoma Mother      Review of Systems  Constitutional: Negative for chills, fever, malaise/fatigue and weight loss.  HENT: Negative for congestion, nosebleeds, sinus pain and sore throat.   Eyes: Negative for photophobia, pain and discharge.  Respiratory: Positive for cough and shortness of breath. Negative for hemoptysis, sputum production and wheezing.   Cardiovascular: Negative for chest pain, palpitations, orthopnea and leg swelling.  Gastrointestinal: Negative for abdominal pain, constipation, diarrhea, nausea and  vomiting.  Genitourinary: Negative for dysuria, frequency, hematuria and urgency.  Musculoskeletal: Negative for back pain, joint pain, myalgias and neck pain.  Skin: Negative for itching and rash.  Neurological: Negative for tingling, tremors, sensory change, speech change, focal weakness, seizures, weakness and headaches.  Psychiatric/Behavioral: Negative for memory loss, substance abuse and suicidal ideas. The patient is not nervous/anxious.   All other systems reviewed and are negative.     Objective:  Physical Exam   Vitals:   07/26/18 1532  BP: 116/72  Pulse: (!) 114  SpO2: 95%  Weight: 96 lb (43.5 kg)  Height: 5\' 5"  (1.651 m)    2L [place]  Gen: chronically ill appearing HENT: OP clear, TM's clear, neck supple PULM: Poor air movement B, normal effort CV: RRR, no mgr, trace edema GI: BS+, soft, nontender Derm: no cyanosis or rash Psyche: normal mood and affect   CBC    Component Value Date/Time   WBC 13.8 (H) 07/20/2018 0102   RBC 4.39 07/20/2018 0102   HGB 13.5 07/20/2018 0102   HCT 44.9 07/20/2018 0102   PLT 303 07/20/2018 0102   MCV 102.3 (H) 07/20/2018 0102   MCH 30.8 07/20/2018 0102   MCHC 30.1 07/20/2018 0102   RDW 12.4 07/20/2018 0102   LYMPHSABS 2.0 07/20/2018 0102   MONOABS 1.2 (H) 07/20/2018 0102   EOSABS 0.2 07/20/2018 0102   BASOSABS 0.1 07/20/2018 0102     Chest  imaging: 10-2017 CXR images reviewed showing emphysema and granulomatous changes  PFT: December 2018 ratio 38%, FEV1 0.39 L 23% predicted  Labs:  Path:  Echo:  Heart Catheterization:  10/2017 PCP notes reviewed where she was seen for COPD, O2 set at 4L      Assessment & Plan:   Chronic bronchitis, unspecified chronic bronchitis type ([place])  Chronic respiratory failure with hypoxia (HCC)  Centrilobular emphysema (HCC)  Muscular deconditioning  Discussion: Sheila Mcconnell continues to struggle from daily symptoms of shortness of breath from her severe COPD but all things  considered she manages fairly well.  She is compliant with her medication regimen.  The DuoNeb has been causing her to be a bit more jittery so I have asked that her assisted living facility only give it as needed.  She is deconditioned and I think could benefit from more physical therapy.  Plan: Muscular deconditioning: I will prescribe physical therapy 3 years assisted living facility  Severe COPD: Take Symbicort 2 puffs twice a day Take Spiriva daily Change DuoNeb to 3 times a day as needed for shortness of breath, not routinely High-dose flu shot when available Stay active Practice good hand hygiene  Chronic respiratory failure with hypoxemia: Continue 2 L of oxygen continuously  We will see you back in 4 to 6 months or sooner if needed   Current Outpatient Medications:  .  amLODipine (NORVASC) 5 MG tablet, Take 1 tablet (5 mg total) daily by mouth., Disp: 90 tablet, Rfl: 1 .  budesonide-formoterol (SYMBICORT) 160-4.5 MCG/ACT inhaler, Inhale 2 puffs into the lungs 2 (two) times daily., Disp: 1 Inhaler, Rfl: 0 .  ipratropium (ATROVENT HFA) 17 MCG/ACT inhaler, Inhale 2 puffs into the lungs every 6 (six) hours as needed., Disp: 1 Inhaler, Rfl: 0 .  ipratropium-albuterol (DUONEB) 0.5-2.5 (3) MG/3ML SOLN, Take 3 mLs by nebulization 3 (three) times daily., Disp: 360 mL, Rfl: 0 .  predniSONE (DELTASONE) 20 MG tablet, 2 tabs po daily x 4 days, Disp: 8 tablet, Rfl: 0 .  tiotropium (SPIRIVA) 18 MCG inhalation capsule, Place 1 capsule (18 mcg total) into inhaler and inhale daily., Disp: 30 capsule, Rfl: 0

## 2018-10-18 ENCOUNTER — Ambulatory Visit: Payer: Medicare HMO | Admitting: Adult Health

## 2018-10-21 ENCOUNTER — Emergency Department (HOSPITAL_COMMUNITY): Payer: Medicare HMO

## 2018-10-21 ENCOUNTER — Other Ambulatory Visit: Payer: Self-pay

## 2018-10-21 ENCOUNTER — Encounter (HOSPITAL_COMMUNITY): Payer: Self-pay | Admitting: Emergency Medicine

## 2018-10-21 ENCOUNTER — Inpatient Hospital Stay (HOSPITAL_COMMUNITY): Payer: Medicare HMO

## 2018-10-21 ENCOUNTER — Observation Stay (HOSPITAL_COMMUNITY)
Admission: EM | Admit: 2018-10-21 | Discharge: 2018-10-29 | Disposition: A | Discharge status: Skilled Nursing Facility | Payer: Medicare HMO | Attending: Student | Admitting: Student

## 2018-10-21 DIAGNOSIS — Z87891 Personal history of nicotine dependence: Secondary | ICD-10-CM | POA: Diagnosis not present

## 2018-10-21 DIAGNOSIS — K5900 Constipation, unspecified: Secondary | ICD-10-CM | POA: Insufficient documentation

## 2018-10-21 DIAGNOSIS — S52692A Other fracture of lower end of left ulna, initial encounter for closed fracture: Secondary | ICD-10-CM | POA: Insufficient documentation

## 2018-10-21 DIAGNOSIS — Z820 Family history of epilepsy and other diseases of the nervous system: Secondary | ICD-10-CM | POA: Insufficient documentation

## 2018-10-21 DIAGNOSIS — J449 Chronic obstructive pulmonary disease, unspecified: Secondary | ICD-10-CM | POA: Diagnosis not present

## 2018-10-21 DIAGNOSIS — M810 Age-related osteoporosis without current pathological fracture: Secondary | ICD-10-CM | POA: Diagnosis not present

## 2018-10-21 DIAGNOSIS — S61511A Laceration without foreign body of right wrist, initial encounter: Secondary | ICD-10-CM | POA: Diagnosis not present

## 2018-10-21 DIAGNOSIS — J9611 Chronic respiratory failure with hypoxia: Secondary | ICD-10-CM | POA: Diagnosis not present

## 2018-10-21 DIAGNOSIS — K219 Gastro-esophageal reflux disease without esophagitis: Secondary | ICD-10-CM | POA: Diagnosis not present

## 2018-10-21 DIAGNOSIS — Z833 Family history of diabetes mellitus: Secondary | ICD-10-CM | POA: Diagnosis not present

## 2018-10-21 DIAGNOSIS — S32402A Unspecified fracture of left acetabulum, initial encounter for closed fracture: Secondary | ICD-10-CM

## 2018-10-21 DIAGNOSIS — I10 Essential (primary) hypertension: Secondary | ICD-10-CM | POA: Diagnosis not present

## 2018-10-21 DIAGNOSIS — R52 Pain, unspecified: Secondary | ICD-10-CM

## 2018-10-21 DIAGNOSIS — R2681 Unsteadiness on feet: Secondary | ICD-10-CM | POA: Diagnosis not present

## 2018-10-21 DIAGNOSIS — J9612 Chronic respiratory failure with hypercapnia: Secondary | ICD-10-CM | POA: Insufficient documentation

## 2018-10-21 DIAGNOSIS — S62102A Fracture of unspecified carpal bone, left wrist, initial encounter for closed fracture: Secondary | ICD-10-CM | POA: Diagnosis present

## 2018-10-21 DIAGNOSIS — Z882 Allergy status to sulfonamides status: Secondary | ICD-10-CM | POA: Diagnosis not present

## 2018-10-21 DIAGNOSIS — S52502A Unspecified fracture of the lower end of left radius, initial encounter for closed fracture: Secondary | ICD-10-CM

## 2018-10-21 DIAGNOSIS — R0602 Shortness of breath: Secondary | ICD-10-CM

## 2018-10-21 DIAGNOSIS — Z9981 Dependence on supplemental oxygen: Secondary | ICD-10-CM | POA: Insufficient documentation

## 2018-10-21 DIAGNOSIS — F329 Major depressive disorder, single episode, unspecified: Secondary | ICD-10-CM | POA: Diagnosis not present

## 2018-10-21 DIAGNOSIS — Z807 Family history of other malignant neoplasms of lymphoid, hematopoietic and related tissues: Secondary | ICD-10-CM | POA: Diagnosis not present

## 2018-10-21 DIAGNOSIS — I4892 Unspecified atrial flutter: Secondary | ICD-10-CM | POA: Insufficient documentation

## 2018-10-21 DIAGNOSIS — W1839XA Other fall on same level, initial encounter: Secondary | ICD-10-CM | POA: Diagnosis not present

## 2018-10-21 DIAGNOSIS — I4510 Unspecified right bundle-branch block: Secondary | ICD-10-CM | POA: Diagnosis not present

## 2018-10-21 DIAGNOSIS — S61512A Laceration without foreign body of left wrist, initial encounter: Secondary | ICD-10-CM

## 2018-10-21 DIAGNOSIS — S52552A Other extraarticular fracture of lower end of left radius, initial encounter for closed fracture: Secondary | ICD-10-CM | POA: Diagnosis not present

## 2018-10-21 DIAGNOSIS — E785 Hyperlipidemia, unspecified: Secondary | ICD-10-CM | POA: Diagnosis not present

## 2018-10-21 DIAGNOSIS — Z79899 Other long term (current) drug therapy: Secondary | ICD-10-CM | POA: Diagnosis not present

## 2018-10-21 DIAGNOSIS — D72828 Other elevated white blood cell count: Secondary | ICD-10-CM | POA: Insufficient documentation

## 2018-10-21 DIAGNOSIS — S62109A Fracture of unspecified carpal bone, unspecified wrist, initial encounter for closed fracture: Secondary | ICD-10-CM | POA: Diagnosis present

## 2018-10-21 DIAGNOSIS — W19XXXA Unspecified fall, initial encounter: Secondary | ICD-10-CM

## 2018-10-21 DIAGNOSIS — M25539 Pain in unspecified wrist: Secondary | ICD-10-CM

## 2018-10-21 LAB — CBC WITH DIFFERENTIAL/PLATELET
BAND NEUTROPHILS: 0 %
BLASTS: 0 %
Basophils Absolute: 0 10*3/uL (ref 0.0–0.1)
Basophils Relative: 0 %
EOS ABS: 0 10*3/uL (ref 0.0–0.5)
Eosinophils Relative: 0 %
HEMATOCRIT: 43.9 % (ref 36.0–46.0)
HEMOGLOBIN: 12.7 g/dL (ref 12.0–15.0)
LYMPHS PCT: 3 %
Lymphs Abs: 0.9 10*3/uL (ref 0.7–4.0)
MCH: 29.1 pg (ref 26.0–34.0)
MCHC: 28.9 g/dL — AB (ref 30.0–36.0)
MCV: 100.7 fL — AB (ref 80.0–100.0)
Metamyelocytes Relative: 0 %
Monocytes Absolute: 1.5 10*3/uL — ABNORMAL HIGH (ref 0.1–1.0)
Monocytes Relative: 5 %
Myelocytes: 0 %
NEUTROS PCT: 92 %
NRBC: 0 % (ref 0.0–0.2)
NRBC: 0 /100{WBCs}
Neutro Abs: 28.4 10*3/uL — ABNORMAL HIGH (ref 1.7–7.7)
Other: 0 %
PROMYELOCYTES RELATIVE: 0 %
Platelets: 300 10*3/uL (ref 150–400)
RBC: 4.36 MIL/uL (ref 3.87–5.11)
RDW: 13.6 % (ref 11.5–15.5)
WBC: 30.8 10*3/uL — AB (ref 4.0–10.5)

## 2018-10-21 LAB — URINALYSIS, ROUTINE W REFLEX MICROSCOPIC
Bilirubin Urine: NEGATIVE
GLUCOSE, UA: NEGATIVE mg/dL
HGB URINE DIPSTICK: NEGATIVE
KETONES UR: NEGATIVE mg/dL
LEUKOCYTES UA: NEGATIVE
NITRITE: NEGATIVE
Protein, ur: NEGATIVE mg/dL
Specific Gravity, Urine: 1.017 (ref 1.005–1.030)
pH: 6 (ref 5.0–8.0)

## 2018-10-21 LAB — COMPREHENSIVE METABOLIC PANEL
ALBUMIN: 3.2 g/dL — AB (ref 3.5–5.0)
ALK PHOS: 76 U/L (ref 38–126)
ALT: 30 U/L (ref 0–44)
ANION GAP: 10 (ref 5–15)
AST: 18 U/L (ref 15–41)
BILIRUBIN TOTAL: 0.5 mg/dL (ref 0.3–1.2)
BUN: 17 mg/dL (ref 8–23)
CALCIUM: 9 mg/dL (ref 8.9–10.3)
CO2: 36 mmol/L — ABNORMAL HIGH (ref 22–32)
Chloride: 95 mmol/L — ABNORMAL LOW (ref 98–111)
Creatinine, Ser: 0.58 mg/dL (ref 0.44–1.00)
GFR calc Af Amer: >60 mL/min (ref >60)
GFR calc non Af Amer: >60 mL/min (ref >60)
GLUCOSE: 156 mg/dL — AB (ref 70–99)
Potassium: 3.5 mmol/L (ref 3.5–5.1)
Sodium: 141 mmol/L (ref 135–145)
TOTAL PROTEIN: 5.8 g/dL — AB (ref 6.5–8.1)

## 2018-10-21 LAB — MAGNESIUM: Magnesium: 1.9 mg/dL (ref 1.7–2.4)

## 2018-10-21 LAB — MRSA PCR SCREENING: MRSA BY PCR: NEGATIVE

## 2018-10-21 MED ORDER — ENOXAPARIN SODIUM 40 MG/0.4ML ~~LOC~~ SOLN
40.0000 mg | Freq: Every day | SUBCUTANEOUS | Status: DC
  Administered 2018-10-21 – 2018-10-29 (×9): 40 mg via SUBCUTANEOUS
  Filled 2018-10-21 (×9): qty 0.4

## 2018-10-21 MED ORDER — SODIUM CHLORIDE 0.9% FLUSH
3.0000 mL | Freq: Two times a day (BID) | INTRAVENOUS | Status: DC
  Administered 2018-10-21 – 2018-10-29 (×17): 3 mL via INTRAVENOUS

## 2018-10-21 MED ORDER — MOMETASONE FURO-FORMOTEROL FUM 200-5 MCG/ACT IN AERO
2.0000 | INHALATION_SPRAY | Freq: Two times a day (BID) | RESPIRATORY_TRACT | Status: DC
  Administered 2018-10-21 – 2018-10-29 (×14): 2 via RESPIRATORY_TRACT
  Filled 2018-10-21: qty 8.8

## 2018-10-21 MED ORDER — AMLODIPINE BESYLATE 5 MG PO TABS
5.0000 mg | ORAL_TABLET | Freq: Every day | ORAL | Status: DC
  Administered 2018-10-21 – 2018-10-22 (×2): 5 mg via ORAL
  Filled 2018-10-21 (×2): qty 1

## 2018-10-21 MED ORDER — LIDOCAINE HCL (PF) 1 % IJ SOLN
5.0000 mL | Freq: Once | INTRAMUSCULAR | Status: AC
  Administered 2018-10-21: 5 mL
  Filled 2018-10-21: qty 5

## 2018-10-21 MED ORDER — IPRATROPIUM-ALBUTEROL 0.5-2.5 (3) MG/3ML IN SOLN
3.0000 mL | Freq: Three times a day (TID) | RESPIRATORY_TRACT | Status: DC | PRN
  Administered 2018-10-21: 3 mL via RESPIRATORY_TRACT
  Filled 2018-10-21: qty 3

## 2018-10-21 MED ORDER — MORPHINE SULFATE (PF) 4 MG/ML IV SOLN
4.0000 mg | Freq: Once | INTRAVENOUS | Status: AC
  Administered 2018-10-21: 4 mg via INTRAVENOUS
  Filled 2018-10-21: qty 1

## 2018-10-21 MED ORDER — ONDANSETRON HCL 4 MG PO TABS: 4.0000 mg | ORAL_TABLET | Freq: Four times a day (QID) | ORAL | Status: DC | PRN

## 2018-10-21 MED ORDER — FENTANYL CITRATE (PF) 100 MCG/2ML IJ SOLN
50.0000 ug | Freq: Once | INTRAMUSCULAR | Status: AC
  Administered 2018-10-21: 50 ug via INTRAVENOUS
  Filled 2018-10-21: qty 2

## 2018-10-21 MED ORDER — SODIUM CHLORIDE 0.9 % IV SOLN: 250.0000 mL | INTRAVENOUS | Status: DC | PRN

## 2018-10-21 MED ORDER — IBUPROFEN 200 MG PO TABS
200.0000 mg | ORAL_TABLET | Freq: Three times a day (TID) | ORAL | Status: DC
  Administered 2018-10-21 – 2018-10-22 (×4): 200 mg via ORAL
  Filled 2018-10-21 (×4): qty 1

## 2018-10-21 MED ORDER — MORPHINE SULFATE (PF) 2 MG/ML IV SOLN
2.0000 mg | INTRAVENOUS | Status: DC | PRN
  Administered 2018-10-21 – 2018-10-22 (×2): 2 mg via INTRAVENOUS
  Filled 2018-10-21 (×3): qty 1

## 2018-10-21 MED ORDER — OXYCODONE-ACETAMINOPHEN 5-325 MG PO TABS
1.0000 | ORAL_TABLET | ORAL | Status: DC | PRN
  Administered 2018-10-21 – 2018-10-23 (×3): 1 via ORAL
  Filled 2018-10-21 (×4): qty 1

## 2018-10-21 MED ORDER — FUROSEMIDE 20 MG PO TABS: 20.0000 mg | ORAL_TABLET | Freq: Every day | ORAL | Status: DC

## 2018-10-21 MED ORDER — ACETAMINOPHEN 500 MG PO TABS: 500.0000 mg | ORAL_TABLET | Freq: Four times a day (QID) | ORAL | Status: DC | PRN

## 2018-10-21 MED ORDER — SODIUM CHLORIDE 0.9% FLUSH: 3.0000 mL | INTRAVENOUS | Status: DC | PRN

## 2018-10-21 MED ORDER — LACTATED RINGERS IV BOLUS
1000.0000 mL | Freq: Once | INTRAVENOUS | Status: AC
  Administered 2018-10-21: 1000 mL via INTRAVENOUS

## 2018-10-21 MED ORDER — ACETAMINOPHEN 500 MG PO TABS
500.0000 mg | ORAL_TABLET | Freq: Four times a day (QID) | ORAL | Status: DC
  Administered 2018-10-21 – 2018-10-29 (×31): 500 mg via ORAL
  Filled 2018-10-21 (×31): qty 1

## 2018-10-21 MED ORDER — ONDANSETRON HCL 4 MG/2ML IJ SOLN
4.0000 mg | Freq: Four times a day (QID) | INTRAMUSCULAR | Status: DC | PRN
  Administered 2018-10-25: 4 mg via INTRAVENOUS
  Filled 2018-10-21: qty 2

## 2018-10-21 MED ORDER — MIRTAZAPINE 15 MG PO TABS
7.5000 mg | ORAL_TABLET | Freq: Every day | ORAL | Status: DC
  Administered 2018-10-21 – 2018-10-28 (×8): 7.5 mg via ORAL
  Filled 2018-10-21 (×8): qty 1

## 2018-10-21 NOTE — H&P (Addendum)
History and Physical    Sheila Mcconnell ZOX:[id number] DOB: 1927/04/08 DOA: 10/21/2018  PCP: Ma Hillock, DO   Patient coming from: Assisted living   Chief Complaint: Left wrist pain.   HPI: Sheila Mcconnell is a 84 y.o. female with medical history significant of severe COPD, chronic hypoxic respiratory failure, GERD, hypertension, dyslipidemia and osteoporosis.  Early this morning around 1 AM, she was standing, changing her shirt when suddenly lost her balance and fell.  Landed on her left side, developing severe left wrist pain, 10 out of 10 intensity, sharp in nature, worse with movement, no radiation, no associated symptoms.  No head trauma loss of consciousness.  Due to her significant pain she was brought into the hospital for further evaluation.  Recent COPD exacerbation, patient was on antibiotics and systemic steroids.  ED Course: Radiographic work-up showed left wrist fracture, orthopedic surgery was consulted for further evaluation.   Review of Systems:  1. General: No fevers, no chills, no weight gain or weight loss 2. ENT: No runny nose or sore throat, no hearing disturbances 3. Pulmonary: No dyspnea, cough, wheezing, or hemoptysis 4. Cardiovascular: No angina, claudication, lower extremity edema, pnd or orthopnea 5. Gastrointestinal: No nausea or vomiting, no diarrhea or constipation 6. Hematology: No easy bruisability or frequent infections 7. Urology: No dysuria, hematuria or increased urinary frequency 8. Dermatology: No rashes. 9. Neurology: No seizures or paresthesias 10. Musculoskeletal: left wrist pain as mentioned in HPI.   Past Medical History:  Diagnosis Date  . Allergy   . Cataracts, bilateral   . Dependence on supplemental oxygen    3.5l (pts stated 2.5 LNC), [facility]  . Emphysema of lung ([place])   . GERD (gastroesophageal reflux disease)   . Hyperlipidemia   . Hypertension   . Osteoporosis    had been on Boniva    Past Surgical History:    Procedure Laterality Date  . NO PAST SURGERIES       reports that she has quit smoking. Her smoking use included cigarettes. She has a 50.00 pack-year smoking history. She has never used smokeless tobacco. She reports that she drinks about 2.0 standard drinks of alcohol per week. She reports that she does not use drugs.  Allergies  Allergen Reactions  . Sulfa Antibiotics Nausea Only    Family History  Problem Relation Age of Onset  . Diabetes Mother   . Hearing loss Mother   . Lymphoma Mother      Prior to Admission medications   Medication Sig Start Date End Date Taking? Authorizing Provider  acetaminophen (TYLENOL) 500 MG tablet Take 500 mg by mouth every 6 (six) hours as needed for mild pain.   Yes [provider]  amLODipine (NORVASC) 5 MG tablet Take 1 tablet (5 mg total) daily by mouth. 10/16/17  Yes Kuneff, Renee A, DO  budesonide-formoterol (SYMBICORT) 160-4.5 MCG/ACT inhaler Inhale 2 puffs into the lungs 2 (two) times daily. 07/06/18  Yes Shelly Coss, MD  furosemide (LASIX) 20 MG tablet Take 20 mg by mouth daily.   Yes [provider]  ipratropium (ATROVENT HFA) 17 MCG/ACT inhaler Inhale 2 puffs into the lungs every 6 (six) hours as needed. Patient taking differently: Inhale 2 puffs into the lungs every 6 (six) hours as needed for wheezing.  07/06/18  Yes Adhikari, Tamsen Meek, MD  ipratropium-albuterol (DUONEB) 0.5-2.5 (3) MG/3ML SOLN Take 3 mLs by nebulization 3 (three) times daily. Patient taking differently: Take 3 mLs by nebulization 3 (three) times daily as needed (  for SOB).  07/06/18  Yes Shelly Coss, MD  mirtazapine (REMERON) 15 MG tablet Take 7.5 mg by mouth at bedtime.   Yes [provider]  tiotropium (SPIRIVA) 18 MCG inhalation capsule Place 1 capsule (18 mcg total) into inhaler and inhale daily. 07/06/18  Yes Shelly Coss, MD  predniSONE (DELTASONE) 20 MG tablet 2 tabs po daily x 4 days Patient not taking: Reported on 10/21/2018 07/20/18    Deno Etienne, DO    Physical Exam: Vitals:   10/21/18 0530 10/21/18 0545 10/21/18 0615 10/21/18 0700  BP: 111/88 113/84 139/86 111/82  Pulse: (!) 115 (!) 118 (!) 118 (!) 117  Resp:    16  Temp:      TempSrc:      SpO2: 99% 95% 95% 98%     Vitals:   10/21/18 0530 10/21/18 0545 10/21/18 0615 10/21/18 0700  BP: 111/88 113/84 139/86 111/82  Pulse: (!) 115 (!) 118 (!) 118 (!) 117  Resp:    16  Temp:      TempSrc:      SpO2: 99% 95% 95% 98%   General: deconditioned  Neurology: Awake and alert, non focal Head and Neck. Head normocephalic. Neck supple with no adenopathy or thyromegaly.   E ENT: mild pallor, no icterus, oral mucosa moist Cardiovascular: No JVD. S1-S2 present, rhythmic, no gallops, rubs, or murmurs. No lower extremity edema. Pulmonary: vesicular breath sounds bilaterally, adequate air movement, no wheezing, rhonchi or rales. Gastrointestinal. Abdomen with no organomegaly, non tender, no rebound or guarding Skin. No rashes Musculoskeletal: left wrist in cast. No left hand digit edema or discoloration.     Labs on Admission: I have personally reviewed following labs and imaging studies  CBC: Recent Labs  Lab 10/21/18 0400  WBC 30.8*  NEUTROABS 28.4*  HGB 12.7  HCT 43.9  MCV 100.7*  PLT 956   Basic Metabolic Panel: Recent Labs  Lab 10/21/18 0400  NA 141  K 3.5  CL 95*  CO2 36*  GLUCOSE 156*  BUN 17  CREATININE 0.58  CALCIUM 9.0  MG 1.9   GFR: CrCl cannot be calculated (Unknown ideal weight.). Liver Function Tests: Recent Labs  Lab 10/21/18 0400  AST 18  ALT 30  ALKPHOS 76  BILITOT 0.5  PROT 5.8*  ALBUMIN 3.2*   No results for input(s): LIPASE, AMYLASE in the last 168 hours. No results for input(s): AMMONIA in the last 168 hours. Coagulation Profile: No results for input(s): INR, PROTIME in the last 168 hours. Cardiac Enzymes: No results for input(s): CKTOTAL, CKMB, CKMBINDEX, TROPONINI in the last 168 hours. BNP (last 3 results) No  results for input(s): PROBNP in the last 8760 hours. HbA1C: No results for input(s): HGBA1C in the last 72 hours. CBG: No results for input(s): GLUCAP in the last 168 hours. Lipid Profile: No results for input(s): CHOL, HDL, LDLCALC, TRIG, CHOLHDL, LDLDIRECT in the last 72 hours. Thyroid Function Tests: No results for input(s): TSH, T4TOTAL, FREET4, T3FREE, THYROIDAB in the last 72 hours. Anemia Panel: No results for input(s): VITAMINB12, FOLATE, FERRITIN, TIBC, IRON, RETICCTPCT in the last 72 hours. Urine analysis:    Component Value Date/Time   COLORURINE YELLOW 10/21/2018 [place] 10/21/2018 0647   LABSPEC 1.017 10/21/2018 0647   PHURINE 6.0 10/21/2018 [place] 10/21/2018 [place] NEGATIVE 10/21/2018 [place] 10/21/2018 [place] 10/21/2018 0647   PROTEINUR NEGATIVE 10/21/2018 0647   NITRITE NEGATIVE 10/21/2018  [place] 10/21/2018 0647    Radiological Exams on Admission: Dg Chest 1 View  Result Date: 10/21/2018 CLINICAL DATA:  Status post fall. Concern for chest injury. Initial encounter. EXAM: CHEST  1 VIEW COMPARISON:  Chest radiograph performed 07/20/2018 FINDINGS: The lungs are hyperexpanded, with flattening of the hemidiaphragms, compatible with COPD. Chronic peribronchial thickening is noted. There is no evidence of focal opacification, pleural effusion or pneumothorax. The cardiomediastinal silhouette is within normal limits. No acute osseous abnormalities are seen. IMPRESSION: Findings of COPD. No acute cardiopulmonary process seen. No displaced rib fractures identified. Electronically Signed   By: Garald Balding M.D.   On: 10/21/2018 04:56   Dg Forearm Left  Result Date: 10/21/2018 CLINICAL DATA:  Status post fall, with left wrist pain. Initial encounter. EXAM: LEFT FOREARM - 2 VIEW COMPARISON:  None. FINDINGS: There is a comminuted, impacted and displaced fracture of the distal  radial metaphysis, better characterized on concurrent wrist radiographs. There is also a displaced fracture of the distal ulnar metaphysis. The proximal radius and ulna appear intact. No elbow joint effusion is seen. The carpal rows appear grossly intact. Alignment of the first carpometacarpal joint is not well assessed. IMPRESSION: 1. Comminuted, impacted and displaced fracture of the distal radial metaphysis, better characterized on concurrent wrist radiographs. 2. Displaced fracture of the distal ulnar metaphysis. 3. Alignment of the first carpometacarpal joint is not well assessed. Electronically Signed   By: Garald Balding M.D.   On: 10/21/2018 03:20   Dg Wrist Complete Left  Result Date: 10/21/2018 CLINICAL DATA:  Evaluate for reduction EXAM: LEFT WRIST - COMPLETE 3+ VIEW COMPARISON:  10/21/2018 at 0246 hours FINDINGS: Comminuted intra-articular distal radial/radial styloid fracture. Fracture fragments are in near anatomic alignment and position following reduction. Mild widening of the radiocarpal articulation. Mildly comminuted fracture of the ulnar styloid and distal ulna, in near anatomic alignment and position. Overlying cast obscures fine osseous detail. IMPRESSION: Improved alignment status post reduction of comminuted distal radial and ulnar fractures, as above. Electronically Signed   By: Julian Hy M.D.   On: 10/21/2018 07:30   Dg Wrist Complete Left  Result Date: 10/21/2018 CLINICAL DATA:  Status post fall, with left wrist pain, acute onset. Initial encounter. EXAM: LEFT WRIST - COMPLETE 3+ VIEW COMPARISON:  None. FINDINGS: There is a comminuted, impacted and significantly dorsally displaced fracture of the distal radial metaphysis, with associated shortening. The carpal rows articulate with the distal fragments. There is also a displaced fracture of the distal ulnar metaphysis. The carpal rows appear grossly intact. The first carpometacarpal joint is difficult to fully assess,  though it appears in grossly normal alignment on the cross-table view. Soft tissue swelling is noted about the wrist. IMPRESSION: 1. Comminuted, impacted and significantly dorsally displaced fracture of the distal radial metaphysis, with associated shortening. 2. Displaced fracture of the distal ulnar metaphysis. Electronically Signed   By: Garald Balding M.D.   On: 10/21/2018 03:22   Dg Hip Unilat W Or Wo Pelvis 2-3 Views Left  Result Date: 10/21/2018 CLINICAL DATA:  Status post fall, with left hip pain. Initial encounter. EXAM: DG HIP (WITH OR WITHOUT PELVIS) 2-3V LEFT COMPARISON:  None. FINDINGS: Cortical disruption at the level of the left acetabulum raises concern for a minimally displaced fracture. Would correlate for associated symptoms. Both femoral heads are seated normally within their respective acetabula. The proximal left femur appears intact. No significant degenerative change is appreciated. The sacroiliac joints are unremarkable in appearance. The visualized  bowel gas pattern is grossly unremarkable in appearance. Diffuse vascular calcifications are seen. IMPRESSION: 1. Cortical disruption at the level of the left acetabulum raises concern for a minimally displaced fracture. Would correlate for associated symptoms. 2. Diffuse vascular calcifications seen. Electronically Signed   By: Garald Balding M.D.   On: 10/21/2018 03:24    EKG: Independently reviewed.  Sinus rhythm, normal axis, right bundle branch block.  Assessment/Plan Active Problems:   Left wrist fracture  84 year old female who sustained a mechanical fall without head trauma or loss of consciousness, injuring her left wrist.  Severe pain post trauma, not able to ambulate.  On her initial physical examination temperature 97.5, blood pressure 111/83, heart rate 103, respiratory rate 22, oxygen saturation 94% on 2 L per nasal cannula.  Dry mucous membranes, lungs clear to auscultation bilaterally, heart S1-S2 present rhythmic,  abdomen soft nontender, trace lower extremity edema, left wrist placed in a cast.  Sodium 141, potassium 3.5, chloride 95, bicarb 36, glucose 156, BUN 17, creatinine 0.58, white count 30.8, hemoglobin 12.7, hematocrit 43.9, platelets 300.  Urine analysis negative for infection.  Chest radiograph with significant hyperinflation with positive lung markings bilaterally.  Forearm x-rays with comminuted, impacted and displaced fracture of the distal radial metaphysis.  Displaced fracture of the distal ulnar metaphysis.  Cortical disruption at the level of the left acetabulum, raising concern about minimal displaced fracture.   Patient will be admitted to the hospital with working diagnosis of displaced fracture of the distal radial and ulnar metaphysis.  1.  Acute left wrist fracture, displaced distal radial and ulnar metaphysis.  Patient will be admitted to the medical ward, continue pain control with acetaminophen and morphine, will add scheduled ibuprofen as well.  Follow-up with orthopedic hand surgery consultation and physical therapy recommendations.  DVT and GI prophylaxis.  Hip films showing cortical disruption at the level of the left acetabulum, follow-up on orthopedic input, may need for further imaging.  2.  Hypertension.  Continue blood pressure control with amlodipine.  Hold furosemide for now to avoid hypovolemia.  3.  Severe COPD with reactive leukocytosis.  No acute exacerbation, continue bronchodilator with with desonide/formoterol, DuoNeb and tiotropium.  Patient recently on systemic steroids, follow-up cell count in the morning, hold antibiotics.  4.  Depression.  Continue mirtazapine.  DVT prophylaxis: enoxaparin  Code Status:  DNR   Family Communication: I spoke with patient's family at the bedside and all questions were addressed.   Disposition Plan: Home/ assisted living    Consults called: Orthopedic hand surgery  Admission status:  Observation.     Sachiko Methot Gerome Apley  MD [facility] Pager [phone]  If 7PM-7AM, please contact night-coverage [email or web address] Password [facility]  10/21/2018, 7:48 AM

## 2018-10-21 NOTE — ED Provider Notes (Addendum)
Emergency Department Provider Note   I have reviewed the triage vital signs and the nursing notes.   HISTORY  Chief Complaint Fall   HPI Sheila Mcconnell is a 84 y.o. female who presents of the fall with left wrist pain.  Patient states that she was standing there and felt herself get low bit lightheaded when she turned and then fell down landing on her left hand and subsequently having pain there.  Has not tried to ambulate since the event.  Also sustained 2 skin tears that she knows of.  Does not complain of any pain elsewhere.  Did not have any headache, vision change, paresthesias, numbness, chest pain, back pain or abdominal pain prior to the fall.  No recent illnesses.  Did not syncopized.  Did not hit her head. No other associated or modifying symptoms.    Past Medical History:  Diagnosis Date  . Allergy   . Cataracts, bilateral   . Dependence on supplemental oxygen    3.5l (pts stated 2.5 LNC), [employer] [facility]  . Emphysema of lung ([place])   . GERD (gastroesophageal reflux disease)   . Hyperlipidemia   . Hypertension   . Osteoporosis    had been on Boniva    Patient Active Problem List   Diagnosis Date Noted  . Left wrist fracture 10/21/2018  . Malnutrition of moderate degree 07/05/2018  . Goals of care, counseling/discussion   . Palliative care encounter   . Emphysema of lung ([place]) 07/04/2018  . Dyspnea on exertion 07/04/2018  . Chronic hypercapnic respiratory failure ([place]) 07/04/2018  . Emphysema lung ([place]) 06/27/2017  . Oxygen dependent 06/27/2017  . Hypertension 06/27/2017    Past Surgical History:  Procedure Laterality Date  . NO PAST SURGERIES      Current Outpatient Rx  . Order #: [id number] Class: Historical Med  . Order #: [id number] Class: Normal  . Order #: [id number] Class: Print  . Order #: [id number] Class: Historical Med  . Order #: [id number] Class: Print  . Order #: [id number] Class: Print  . Order #: [id number] Class: Historical Med  . Order #:  [id number] Class: Print  . Order #: [id number] Class: Print    Allergies Sulfa antibiotics  Family History  Problem Relation Age of Onset  . Diabetes Mother   . Hearing loss Mother   . Lymphoma Mother     Social History Social History   Tobacco Use  . Smoking status: Former Smoker    Packs/day: 1.00    Years: 50.00    Pack years: 50.00    Types: Cigarettes  . Smokeless tobacco: Never Used  Substance Use Topics  . Alcohol use: Yes    Alcohol/week: 2.0 standard drinks    Types: 2 Glasses of wine per week  . Drug use: No    Review of Systems  All other systems negative except as documented in the HPI. All pertinent positives and negatives as reviewed in the HPI. ____________________________________________   PHYSICAL EXAM:  VITAL SIGNS: ED Triage Vitals  Enc Vitals Group     BP 10/21/18 0150 (!) 144/92     Pulse Rate 10/21/18 0150 (!) 112     Resp 10/21/18 0150 (!) 22     Temp 10/21/18 0155 (!) 97.1 F (36.2 C)     Temp Source 10/21/18 0155 Oral     SpO2 10/21/18 0150 99 %    Constitutional: Alert and oriented. Well appearing and in no acute distress. Eyes: Conjunctivae are normal. PERRL. EOMI. Head: Atraumatic. Nose: No congestion/rhinnorhea. Mouth/Throat: Mucous membranes are  moist.  Oropharynx non-erythematous. Neck: No stridor.  No meningeal signs.   Cardiovascular: tachycardic rate, regular rhythm. Good peripheral circulation. Grossly normal heart sounds.   Respiratory: Normal respiratory effort.  No retractions. Lungs diminished. Gastrointestinal: Soft and nontender. No distention.  Musculoskeletal: No lower extremity tenderness nor edema.hematoma and ttp to left wrist. No significant deformity noted. No cervical spine tenderness, thoracic spine tenderness or Lumbar spine tenderness.  No tenderness or pain with palpation and full ROM of all joints in upper and lower extremities but does have pain with palpation of left pelvis with anterior/lateral  compression along with direct palpation. No ecchymosis or other signs of trauma on back or extremities.  No Pain with AP or lateral compression of ribs.  No Paracervical ttp, paraspinal ttp Neurologic:  Normal speech and language. No gross focal neurologic deficits are appreciated.  Skin:  Skin is warm and dry. Skin tear to right wrist hemostatic. Large deep skin tear/laceration to left wrist over area of wrist tenderness. ? Open fracture. No rash noted.  ____________________________________________   LABS (all labs ordered are listed, but only abnormal results are displayed)  Labs Reviewed  CBC WITH DIFFERENTIAL/PLATELET - Abnormal; Notable for the following components:      Result Value   WBC 30.8 (*)    MCV 100.7 (*)    MCHC 28.9 (*)    Neutro Abs 28.4 (*)    Monocytes Absolute 1.5 (*)    All other components within normal limits  COMPREHENSIVE METABOLIC PANEL - Abnormal; Notable for the following components:   Chloride 95 (*)    CO2 36 (*)    Glucose, Bld 156 (*)    Total Protein 5.8 (*)    Albumin 3.2 (*)    All other components within normal limits  MAGNESIUM  URINALYSIS, ROUTINE W REFLEX MICROSCOPIC   ____________________________________________  EKG   EKG Interpretation  Date/Time:    Ventricular Rate:    PR Interval:    QRS Duration:   QT Interval:    QTC Calculation:   R Axis:     Text Interpretation:         ____________________________________________  RADIOLOGY  Dg Chest 1 View  Result Date: 10/21/2018 CLINICAL DATA:  Status post fall. Concern for chest injury. Initial encounter. EXAM: CHEST  1 VIEW COMPARISON:  Chest radiograph performed 07/20/2018 FINDINGS: The lungs are hyperexpanded, with flattening of the hemidiaphragms, compatible with COPD. Chronic peribronchial thickening is noted. There is no evidence of focal opacification, pleural effusion or pneumothorax. The cardiomediastinal silhouette is within normal limits. No acute osseous  abnormalities are seen. IMPRESSION: Findings of COPD. No acute cardiopulmonary process seen. No displaced rib fractures identified. Electronically Signed   By: Garald Balding M.D.   On: 10/21/2018 04:56   Dg Forearm Left  Result Date: 10/21/2018 CLINICAL DATA:  Status post fall, with left wrist pain. Initial encounter. EXAM: LEFT FOREARM - 2 VIEW COMPARISON:  None. FINDINGS: There is a comminuted, impacted and displaced fracture of the distal radial metaphysis, better characterized on concurrent wrist radiographs. There is also a displaced fracture of the distal ulnar metaphysis. The proximal radius and ulna appear intact. No elbow joint effusion is seen. The carpal rows appear grossly intact. Alignment of the first carpometacarpal joint is not well assessed. IMPRESSION: 1. Comminuted, impacted and displaced fracture of the distal radial metaphysis, better characterized on concurrent wrist radiographs. 2. Displaced fracture of the distal ulnar metaphysis. 3. Alignment of the first carpometacarpal joint is not well assessed. Electronically  Signed   By: Garald Balding M.D.   On: 10/21/2018 03:20   Dg Wrist Complete Left  Result Date: 10/21/2018 CLINICAL DATA:  Status post fall, with left wrist pain, acute onset. Initial encounter. EXAM: LEFT WRIST - COMPLETE 3+ VIEW COMPARISON:  None. FINDINGS: There is a comminuted, impacted and significantly dorsally displaced fracture of the distal radial metaphysis, with associated shortening. The carpal rows articulate with the distal fragments. There is also a displaced fracture of the distal ulnar metaphysis. The carpal rows appear grossly intact. The first carpometacarpal joint is difficult to fully assess, though it appears in grossly normal alignment on the cross-table view. Soft tissue swelling is noted about the wrist. IMPRESSION: 1. Comminuted, impacted and significantly dorsally displaced fracture of the distal radial metaphysis, with associated shortening. 2.  Displaced fracture of the distal ulnar metaphysis. Electronically Signed   By: Garald Balding M.D.   On: 10/21/2018 03:22   Dg Hip Unilat W Or Wo Pelvis 2-3 Views Left  Result Date: 10/21/2018 CLINICAL DATA:  Status post fall, with left hip pain. Initial encounter. EXAM: DG HIP (WITH OR WITHOUT PELVIS) 2-3V LEFT COMPARISON:  None. FINDINGS: Cortical disruption at the level of the left acetabulum raises concern for a minimally displaced fracture. Would correlate for associated symptoms. Both femoral heads are seated normally within their respective acetabula. The proximal left femur appears intact. No significant degenerative change is appreciated. The sacroiliac joints are unremarkable in appearance. The visualized bowel gas pattern is grossly unremarkable in appearance. Diffuse vascular calcifications are seen. IMPRESSION: 1. Cortical disruption at the level of the left acetabulum raises concern for a minimally displaced fracture. Would correlate for associated symptoms. 2. Diffuse vascular calcifications seen. Electronically Signed   By: Garald Balding M.D.   On: 10/21/2018 03:24    ____________________________________________   PROCEDURES  Procedure(s) performed:   .Ortho Injury Treatment Date/Time: 10/21/2018 6:25 AM Performed by: Merrily Pew, MD Authorized by: Merrily Pew, MD   Consent:    Consent obtained:  Verbal   Consent given by:  Patient   Risks discussed:  Fracture, irreducible dislocation, vascular damage, nerve damage and recurrent dislocation   Alternatives discussed:  No treatment and alternative treatmentInjury location: wrist Location details: left wrist Injury type: fracture-dislocation Pre-procedure neurovascular assessment: neurovascularly intact Pre-procedure distal perfusion: normal Pre-procedure neurological function: normal Pre-procedure range of motion: normal Anesthesia: hematoma block  Anesthesia: Local anesthesia used: yes Local Anesthetic:  lidocaine 1% without epinephrine Anesthetic total: 5 mL  Patient sedated: NoManipulation performed: yes Skin traction used: no Skeletal traction used: no Reduction successful: yes Immobilization: splint and sling Supplies used: cotton padding,  elastic bandage and Ortho-Glass Post-procedure neurovascular assessment: post-procedure neurovascularly intact Post-procedure distal perfusion: normal Post-procedure neurological function: normal Post-procedure range of motion: normal Patient tolerance: Patient tolerated the procedure well with no immediate complications Comments: Patient had a large deep skin tear on left hand prior to the procedure. This was worsened during the procedure.       ____________________________________________   INITIAL IMPRESSION / ASSESSMENT AND PLAN / ED COURSE  Will eval labs/ecg to ensure no significant abnormalities associated with fall and dizziness. xr affected body parts. Wound care to skin tears however left wrist will likely need some type of more extensive repair depending on fracture status.   Bone fracture to left wrist.  After discussion with hand surgery patient's wrist was used by myself as above.  The skin tear that was there worsened.  Was able to be placed back in  place and Xeroform was applied prior to splinting.  Consistently neurovascularly intact afterwards.  Also found to have acetabular fracture.  I discussed the risk fracture with hand surgery, Dr. Lenon Curt, who will see the patient and decide on treatment. Will try to do surgery with orthopedics if possible.  Discussed the acetabular fracture with Dr. Marcelino Scot with orthopedic surgery who will review the films and decide an appropriate treatment plan as well.  Patient has significant pain with any range of motion or weightbearing on the left leg and is not going to build use crutches with a broken left arm so will admit to medicine for physical therapy, pain control and further management.    Leukocytosis - recently on steroids but also finished abx recently for respiratory infection. Could be related to either. CXR ok. Pending urine.   Discussed with medicine who will admit.   Pertinent labs & imaging results that were available during my care of the patient were reviewed by me and considered in my medical decision making (see chart for details).  ____________________________________________  FINAL CLINICAL IMPRESSION(S) / ED DIAGNOSES  Final diagnoses:  Fall, initial encounter  Closed fracture of distal end of left radius, unspecified fracture morphology, initial encounter  Closed displaced fracture of left acetabulum, unspecified portion of acetabulum, initial encounter ([facility])  Tear of skin of left wrist, initial encounter     MEDICATIONS GIVEN DURING THIS VISIT:  Medications  lactated ringers bolus 1,000 mL (1,000 mLs Intravenous New Bag/Given 10/21/18 0605)  oxyCODONE-acetaminophen (PERCOCET/ROXICET) 5-325 MG per tablet 1 tablet (has no administration in time range)  morphine 2 MG/ML injection 2 mg (has no administration in time range)  fentaNYL (SUBLIMAZE) injection 50 mcg (50 mcg Intravenous Given 10/21/18 0419)  lidocaine (PF) (XYLOCAINE) 1 % injection 5 mL (5 mLs Infiltration Given 10/21/18 0418)  morphine 4 MG/ML injection 4 mg (4 mg Intravenous Given 10/21/18 0559)     NEW OUTPATIENT MEDICATIONS STARTED DURING THIS VISIT:  New Prescriptions   No medications on file    Note:  This note was prepared with assistance of Dragon voice recognition software. Occasional wrong-word or sound-a-like substitutions may have occurred due to the inherent limitations of voice recognition software.   Kaitlen Redford, Corene Cornea, MD 10/21/18 [id number]    Merrily Pew, MD 10/21/18 [phone]

## 2018-10-21 NOTE — ED Notes (Signed)
Family at bedside. 

## 2018-10-21 NOTE — ED Notes (Signed)
ED Provider at bedside. 

## 2018-10-21 NOTE — ED Triage Notes (Signed)
Patient came from [facility] in [place], [place].  Daughter is at the bedside.

## 2018-10-21 NOTE — Plan of Care (Signed)

## 2018-10-21 NOTE — Progress Notes (Signed)
Orthopedic Tech Progress Note Patient Details:  Sheila Mcconnell 09/23/1927 [id number]  Ortho Devices Type of Ortho Device: Post (short arm) splint Ortho Device/Splint Interventions: Application   Post Interventions Patient Tolerated: Well Instructions Provided: Adjustment of device, Care of device   Melony Overly T 10/21/2018, 6:18 AM

## 2018-10-21 NOTE — ED Notes (Signed)
Patient transported to X-ray 

## 2018-10-21 NOTE — ED Triage Notes (Signed)
Patient from [facility], fell getting out of bed.  Patient with deformity to left wrist with laceration to wrist area.  She was given IM 100 mcg Fentanyl.

## 2018-10-21 NOTE — Consult Note (Signed)
Orthopaedic Trauma Service (OTS) Consult   Patient ID: Sheila Mcconnell MRN: [id number] DOB/AGE: 03-15-1927 84 y.o.   Reason for Consult: Pelvic ring fracture Referring Physician: Merrily Pew, MD (EDP)   HPI: Sheila Mcconnell is an 84 y.o. white female who presented to the emergency room early this morning after sustaining a ground-level fall while at her assisted living facility.  Patient was attempting to change her shirt when she fell down on her left wrist hip.  Patient was brought to [facility] hospital for evaluation she was found to have a very displaced and comminuted left distal radius fracture as well as a pelvic ring fracture with suspicion for left acetabulum fracture.  His surgery, Dr. Freada Bergeron was consulted for her left distal radius and orthopedic trauma service was consulted regarding her pelvic ring injury.  Patient seen and evaluated with respect to pelvic ring injury this evening.  Patient is not complaining of too much pelvic pain currently.  Most of her pain is related to her left distal radius however she is quite densely be right now she just received pain medicine and is not complaining of any pain at the current time.  Patient lives in assisted living facility she does not use any assistive devices at baseline although she does have a cane which she does not use per daughter's report.  Patient was recently sick.  No history of frequent falls other th one another fall last week which she attributes to her recent illness.  Patient does have a history of osteoporosis.  Chart says that that she has been on Boniva however family confirmed that she never taken this and she did not tolerate it.  Patient does require supplemental O2.  Patient does have cataracts as well as emphysema and hypertension.   Patient seen in 5 N. 19 she is a sleepy but that does answer some questions he is a little hard of hearing.  Family at the bedside and is able to facilitate that history.  Not  complaining of any pain at current time.  She denies any numbness or tingling.  No other concerns.  She does have some bruising to her left knee but this was related to her fall last week.  Past Medical History:  Diagnosis Date  . Allergy   . Cataracts, bilateral   . Dependence on supplemental oxygen    3.5l (pts stated 2.5 LNC), [employer] [facility]  . Emphysema of lung ([place])   . GERD (gastroesophageal reflux disease)   . Hyperlipidemia   . Hypertension   . Osteoporosis    had been on Boniva    Past Surgical History:  Procedure Laterality Date  . NO PAST SURGERIES      Family History  Problem Relation Age of Onset  . Diabetes Mother   . Hearing loss Mother   . Lymphoma Mother     Social History:  reports that she has quit smoking. Her smoking use included cigarettes. She has a 50.00 pack-year smoking history. She has never used smokeless tobacco. She reports that she drinks about 2.0 standard drinks of alcohol per week. She reports that she does not use drugs.  Allergies:  Allergies  Allergen Reactions  . Sulfa Antibiotics Nausea Only    Medications: I have reviewed the patient's current medications. Current Meds  Medication Sig  . acetaminophen (TYLENOL) 500 MG tablet Take 500 mg by mouth every 6 (six) hours as needed for mild pain.  Marland Kitchen amLODipine (NORVASC) 5 MG  tablet Take 1 tablet (5 mg total) daily by mouth.  . budesonide-formoterol (SYMBICORT) 160-4.5 MCG/ACT inhaler Inhale 2 puffs into the lungs 2 (two) times daily.  . furosemide (LASIX) 20 MG tablet Take 20 mg by mouth daily.  Marland Kitchen ipratropium (ATROVENT HFA) 17 MCG/ACT inhaler Inhale 2 puffs into the lungs every 6 (six) hours as needed. (Patient taking differently: Inhale 2 puffs into the lungs every 6 (six) hours as needed for wheezing. )  . ipratropium-albuterol (DUONEB) 0.5-2.5 (3) MG/3ML SOLN Take 3 mLs by nebulization 3 (three) times daily. (Patient taking differently: Take 3 mLs by nebulization 3 (three) times daily as  needed (for SOB). )  . mirtazapine (REMERON) 15 MG tablet Take 7.5 mg by mouth at bedtime.  Marland Kitchen tiotropium (SPIRIVA) 18 MCG inhalation capsule Place 1 capsule (18 mcg total) into inhaler and inhale daily.     Results for orders placed or performed during the hospital encounter of 10/21/18 (from the past 48 hour(s))  CBC with Differential     Status: Abnormal   Collection Time: 10/21/18  4:00 AM  Result Value Ref Range   WBC 30.8 (H) 4.0 - 10.5 K/uL   RBC 4.36 3.87 - 5.11 MIL/uL   Hemoglobin 12.7 12.0 - 15.0 g/dL   HCT 43.9 36.0 - 46.0 %   MCV 100.7 (H) 80.0 - 100.0 fL   MCH 29.1 26.0 - 34.0 pg   MCHC 28.9 (L) 30.0 - 36.0 g/dL   RDW 13.6 11.5 - 15.5 %   Platelets 300 150 - 400 K/uL   nRBC 0.0 0.0 - 0.2 %   Neutrophils Relative % 92 %   Lymphocytes Relative 3 %   Monocytes Relative 5 %   Eosinophils Relative 0 %   Basophils Relative 0 %   Band Neutrophils 0 %   Metamyelocytes Relative 0 %   Myelocytes 0 %   Promyelocytes Relative 0 %   Blasts 0 %   nRBC 0 0 /100 WBC   Other 0 %   Neutro Abs 28.4 (H) 1.7 - 7.7 K/uL   Lymphs Abs 0.9 0.7 - 4.0 K/uL   Monocytes Absolute 1.5 (H) 0.1 - 1.0 K/uL   Eosinophils Absolute 0.0 0.0 - 0.5 K/uL   Basophils Absolute 0.0 0.0 - 0.1 K/uL   WBC Morphology MILD LEFT SHIFT (1-5% METAS, OCC MYELO, OCC BANDS)     Comment: Performed at [facility], [address] [place], [place] [address]  Comprehensive metabolic panel     Status: Abnormal   Collection Time: 10/21/18  4:00 AM  Result Value Ref Range   Sodium 141 135 - 145 mmol/L   Potassium 3.5 3.5 - 5.1 mmol/L   Chloride 95 (L) 98 - 111 mmol/L   CO2 36 (H) 22 - 32 mmol/L   Glucose, Bld 156 (H) 70 - 99 mg/dL   BUN 17 8 - 23 mg/dL   Creatinine, Ser 0.58 0.44 - 1.00 mg/dL   Calcium 9.0 8.9 - 10.3 mg/dL   Total Protein 5.8 (L) 6.5 - 8.1 g/dL   Albumin 3.2 (L) 3.5 - 5.0 g/dL   AST 18 15 - 41 U/L   ALT 30 0 - 44 U/L   Alkaline Phosphatase 76 38 - 126 U/L   Total Bilirubin 0.5 0.3 - 1.2  mg/dL   GFR calc non Af Amer >60 >60 mL/min   GFR calc Af Amer >60 >60 mL/min    Comment: (NOTE) The eGFR has been calculated using the CKD  EPI equation. This calculation has not been validated in all clinical situations. eGFR's persistently <60 mL/min signify possible Chronic Kidney Disease.    Anion gap 10 5 - 15    Comment: Performed at [facility] [address], [place], [place] [address]  Magnesium     Status: None   Collection Time: 10/21/18  4:00 AM  Result Value Ref Range   Magnesium 1.9 1.7 - 2.4 mg/dL    Comment: Performed at [facility] [address] [place], [place] [address]  Urinalysis, Routine w reflex microscopic     Status: None   Collection Time: 10/21/18  6:47 AM  Result Value Ref Range   Color, Urine YELLOW YELLOW   APPearance CLEAR CLEAR   Specific Gravity, Urine 1.017 1.005 - 1.030   pH 6.0 5.0 - 8.0   Glucose, UA NEGATIVE NEGATIVE mg/dL   Hgb urine dipstick NEGATIVE NEGATIVE   Bilirubin Urine NEGATIVE NEGATIVE   Ketones, ur NEGATIVE NEGATIVE mg/dL   Protein, ur NEGATIVE NEGATIVE mg/dL   Nitrite NEGATIVE NEGATIVE   Leukocytes, UA NEGATIVE NEGATIVE    Comment: Performed at [facility] [address] [place], [place] [address]    Dg Chest 1 View  Result Date: 10/21/2018 CLINICAL DATA:  Status post fall. Concern for chest injury. Initial encounter. EXAM: CHEST  1 VIEW COMPARISON:  Chest radiograph performed 07/20/2018 FINDINGS: The lungs are hyperexpanded, with flattening of the hemidiaphragms, compatible with COPD. Chronic peribronchial thickening is noted. There is no evidence of focal opacification, pleural effusion or pneumothorax. The cardiomediastinal silhouette is within normal limits. No acute osseous abnormalities are seen. IMPRESSION: Findings of COPD. No acute cardiopulmonary process seen. No displaced rib fractures identified. Electronically Signed   By: Garald Balding M.D.   On: 10/21/2018 04:56   Dg Forearm  Left  Result Date: 10/21/2018 CLINICAL DATA:  Status post fall, with left wrist pain. Initial encounter. EXAM: LEFT FOREARM - 2 VIEW COMPARISON:  None. FINDINGS: There is a comminuted, impacted and displaced fracture of the distal radial metaphysis, better characterized on concurrent wrist radiographs. There is also a displaced fracture of the distal ulnar metaphysis. The proximal radius and ulna appear intact. No elbow joint effusion is seen. The carpal rows appear grossly intact. Alignment of the first carpometacarpal joint is not well assessed. IMPRESSION: 1. Comminuted, impacted and displaced fracture of the distal radial metaphysis, better characterized on concurrent wrist radiographs. 2. Displaced fracture of the distal ulnar metaphysis. 3. Alignment of the first carpometacarpal joint is not well assessed. Electronically Signed   By: Garald Balding M.D.   On: 10/21/2018 03:20   Dg Wrist Complete Left  Result Date: 10/21/2018 CLINICAL DATA:  Evaluate for reduction EXAM: LEFT WRIST - COMPLETE 3+ VIEW COMPARISON:  10/21/2018 at 0246 hours FINDINGS: Comminuted intra-articular distal radial/radial styloid fracture. Fracture fragments are in near anatomic alignment and position following reduction. Mild widening of the radiocarpal articulation. Mildly comminuted fracture of the ulnar styloid and distal ulna, in near anatomic alignment and position. Overlying cast obscures fine osseous detail. IMPRESSION: Improved alignment status post reduction of comminuted distal radial and ulnar fractures, as above. Electronically Signed   By: Julian Hy M.D.   On: 10/21/2018 07:30   Dg Wrist Complete Left  Result Date: 10/21/2018 CLINICAL DATA:  Status post fall, with left wrist pain, acute onset. Initial encounter. EXAM: LEFT WRIST - COMPLETE 3+ VIEW COMPARISON:  None. FINDINGS: There is a comminuted, impacted and significantly dorsally  displaced fracture of the distal radial metaphysis, with associated  shortening. The carpal rows articulate with the distal fragments. There is also a displaced fracture of the distal ulnar metaphysis. The carpal rows appear grossly intact. The first carpometacarpal joint is difficult to fully assess, though it appears in grossly normal alignment on the cross-table view. Soft tissue swelling is noted about the wrist. IMPRESSION: 1. Comminuted, impacted and significantly dorsally displaced fracture of the distal radial metaphysis, with associated shortening. 2. Displaced fracture of the distal ulnar metaphysis. Electronically Signed   By: Garald Balding M.D.   On: 10/21/2018 03:22   Dg Pelvis Comp Min 3v  Result Date: 10/21/2018 CLINICAL DATA:  Acetabular fracture. EXAM: JUDET PELVIS - 3+ VIEW COMPARISON:  Left hip x-rays from same day. FINDINGS: Unchanged minimally displaced fracture of the left puboacetabular junction. Minimally displaced fracture of the left inferior pubic ramus. The hip joint spaces are preserved. The pubic symphysis and sacroiliac joints are intact. Osteopenia. Soft tissues are unremarkable. IMPRESSION: 1. Minimally displaced fracture of the left puboacetabular junction without clear extension into the acetabulum. 2. Minimally displaced fracture of the left inferior pubic ramus. Electronically Signed   By: Titus Dubin M.D.   On: 10/21/2018 11:15   Dg Hip Unilat W Or Wo Pelvis 2-3 Views Left  Result Date: 10/21/2018 CLINICAL DATA:  Status post fall, with left hip pain. Initial encounter. EXAM: DG HIP (WITH OR WITHOUT PELVIS) 2-3V LEFT COMPARISON:  None. FINDINGS: Cortical disruption at the level of the left acetabulum raises concern for a minimally displaced fracture. Would correlate for associated symptoms. Both femoral heads are seated normally within their respective acetabula. The proximal left femur appears intact. No significant degenerative change is appreciated. The sacroiliac joints are unremarkable in appearance. The visualized bowel gas  pattern is grossly unremarkable in appearance. Diffuse vascular calcifications are seen. IMPRESSION: 1. Cortical disruption at the level of the left acetabulum raises concern for a minimally displaced fracture. Would correlate for associated symptoms. 2. Diffuse vascular calcifications seen. Electronically Signed   By: Garald Balding M.D.   On: 10/21/2018 03:24    Review of Systems  Constitutional: Negative for fever.  Respiratory: Positive for cough.   Cardiovascular: Negative for chest pain and palpitations.  Gastrointestinal: Negative for nausea and vomiting.  Neurological: Negative for tingling and sensory change.   Blood pressure 117/64, pulse (!) 111, temperature 97.8 F (36.6 C), temperature source Oral, resp. rate 20, SpO2 96 %. Physical Exam  Constitutional:  Frail appearing white female.  Sleepy but arousable   Cardiovascular: Regular rhythm.  Pulmonary/Chest: No respiratory distress.  Breathing is unlabored  Musculoskeletal:  Pelvis/LEx  No acute deformities to the lower extremities or pelvis Patient does have some ecchymosis over her left knee No tenderness to palpation of her left knee. No pain with axial loading or logrolling of her left hip Pelvis is nontender with lateral compression No lumbar or sacral tenderness No tenderness palpation over her suprapubic area No pain with active motion of her hip Patient is able to perform a quad set as well as perform very gentle hip flexion and this is without pain either Distal motor and sensory functions are grossly intact to her lower extremities Full passive and active motion are noted to her ankles bilaterally No significant swelling noted distally Moderate Sarcopenia noted throughout  Extensive swelling noted to her left digits Splint is in place     Assessment/Plan:  84 year old white female s/p ground-level fall  -Ground-level fall  -Left  superior and inferior pubic rami fracture (high ramus fracture on the  left)  Fracture pattern is stable  Weight-bear as tolerated left leg   Will require platform walker due to her left distal radius fracture  Unrestricted range of motion left hip knee and ankle  Ice as needed  PT and OT evaluations  -Left distal radius fracture  Per hand surgery, Dr. Lenon Curt  - Pain management:  Per primary  - Medical issues   Per primary  - Metabolic Bone Disease:  Check vitamin D level  Recommend outpatient bone density scan  Sustained injuries indicate osteoporosis  - Dispo:  No interventions planned for pelvic ring injury  Continue per hand surgery service and internal medicine service    Jari Pigg, PA-C [phone] (C) 10/21/2018, 6:06 PM  [facility] [place] [address] [phone] Domingo Sep (F)

## 2018-10-21 NOTE — Care Management (Signed)
This is a no charge note  Pending admission per Dr. Dayna Barker  84 year old lady with past medical history of hypertension, COPD on 3.5 L oxygen at home, GERD, depression, who had fall in nursing home, caused left wrist fracture.  Orthopedic surgeon, Dr. Marcelino Scot and hand surgeon Dr. Wynetta Fines were consulted. Pt is medicated to MedSurg bed as inpatient.   Ivor Costa, MD  [facility] Pager [phone]  If 7PM-7AM, please contact night-coverage [email or web address] Password [facility] 10/21/2018, 6:32 AM

## 2018-10-22 ENCOUNTER — Encounter (HOSPITAL_COMMUNITY): Payer: Self-pay | Admitting: *Deleted

## 2018-10-22 DIAGNOSIS — S32402D Unspecified fracture of left acetabulum, subsequent encounter for fracture with routine healing: Secondary | ICD-10-CM | POA: Diagnosis not present

## 2018-10-22 DIAGNOSIS — S52502A Unspecified fracture of the lower end of left radius, initial encounter for closed fracture: Secondary | ICD-10-CM | POA: Diagnosis not present

## 2018-10-22 DIAGNOSIS — S32402A Unspecified fracture of left acetabulum, initial encounter for closed fracture: Secondary | ICD-10-CM | POA: Diagnosis not present

## 2018-10-22 DIAGNOSIS — S52552A Other extraarticular fracture of lower end of left radius, initial encounter for closed fracture: Secondary | ICD-10-CM | POA: Diagnosis not present

## 2018-10-22 LAB — BASIC METABOLIC PANEL
ANION GAP: 4 — AB (ref 5–15)
BUN: 20 mg/dL (ref 8–23)
CO2: 38 mmol/L — ABNORMAL HIGH (ref 22–32)
CREATININE: 0.64 mg/dL (ref 0.44–1.00)
Calcium: 8.3 mg/dL — ABNORMAL LOW (ref 8.9–10.3)
Chloride: 96 mmol/L — ABNORMAL LOW (ref 98–111)
GFR calc non Af Amer: >60 mL/min (ref >60)
Glucose, Bld: 137 mg/dL — ABNORMAL HIGH (ref 70–99)
POTASSIUM: 3.5 mmol/L (ref 3.5–5.1)
SODIUM: 138 mmol/L (ref 135–145)

## 2018-10-22 LAB — CBC
HEMATOCRIT: 35.8 % — AB (ref 36.0–46.0)
HEMOGLOBIN: 10.7 g/dL — AB (ref 12.0–15.0)
MCH: 30.1 pg (ref 26.0–34.0)
MCHC: 29.9 g/dL — AB (ref 30.0–36.0)
MCV: 100.6 fL — AB (ref 80.0–100.0)
NRBC: 0 % (ref 0.0–0.2)
Platelets: 192 10*3/uL (ref 150–400)
RBC: 3.56 MIL/uL — ABNORMAL LOW (ref 3.87–5.11)
RDW: 13.5 % (ref 11.5–15.5)
WBC: 16.5 10*3/uL — ABNORMAL HIGH (ref 4.0–10.5)

## 2018-10-22 MED ORDER — TRAMADOL HCL 50 MG PO TABS
50.0000 mg | ORAL_TABLET | Freq: Four times a day (QID) | ORAL | Status: DC | PRN
  Administered 2018-10-22: 50 mg via ORAL
  Filled 2018-10-22: qty 1

## 2018-10-22 NOTE — Progress Notes (Signed)
[facility] PROGRESS NOTE  Tenae Graziosi XBW:[id number] DOB: 25-Feb-1927 DOA: 10/21/2018   PCP: Ma Hillock, DO     Assessment/Plan: Active Problems:   Left wrist fracture   Wrist fracture   84 y.o. female with medical history significant of severe COPD, chronic hypoxic respiratory failure, GERD, hypertension, dyslipidemia and osteoporosis. Patient admitted on 11/17 after a fall. Found to have left superior and inferior pubic rami fracture, left distal radius fracture, seen by trauma surgery, orthopedics. Urine analysis negative for infection.  Chest radiograph with significant hyperinflation with positive lung markings bilaterally.  Forearm x-rays with comminuted, impacted and displaced fracture of the distal radial metaphysis.  Displaced fracture of the distal ulnar metaphysis.  Cortical disruption at the level of the left acetabulum, raising concern about minimal displaced fracture.   Assessment and plan 84 year old female who sustained a mechanical fall without head trauma or loss of consciousness, injuring her left wrist.  Severe pain post trauma, not able to ambulate.  Urine analysis negative for infection.  Chest radiograph with significant hyperinflation with positive lung markings bilaterally.  Forearm x-rays with comminuted, impacted and displaced fracture of the distal radial metaphysis.  Displaced fracture of the distal ulnar metaphysis.  Cortical disruption at the level of the left acetabulum, raising concern about minimal displaced fracture.   Patient will be admitted to the hospital with working diagnosis of displaced fracture of the distal radial and ulnar metaphysis.  1.  Acute left wrist fracture, displaced distal radial and ulnar metaphysis.  Patient   admitted to the medical ward, continue pain control with acetaminophen and morphine,   Follow-up with orthopedic hand surgery consultation and physical therapy recommendations.  DVT and GI prophylaxis.  Hip  films showing cortical disruption at the level of the left acetabulum, follow-up on orthopedic input, may need for further imaging. For the less than. Inferior pubic rami fracture, weightbearing as tolerated, will require platform walker due to her left distal radius fracture. For  left distal radius fracture , waiting to see hand surgery Dr. Lenon Curt  2.  Hypertension.   hold amlodipine has a blood pressure soft.  Hold furosemide for now to avoid hypovolemia.  3.  Severe COPD with reactive leukocytosis.  No acute exacerbation, continue bronchodilator with with desonide/formoterol, DuoNeb and tiotropium.  Patient recently on systemic steroids, follow-up cell count in the morning, hold antibiotics.  4.  Depression.  Continue mirtazapine.   DVT prophylaxsis  Lovenox  Code Status:  DO NOT RESUSCITATE   Family Communication: Discussed in detail with the patient, all imaging results, lab results explained to the patient   Disposition Plan:   Patient noted to be evaluated by hand surgeon. Not yet evaluated by PT/OT, anticipate she will continue to be observed for another 24 hours      Consultants:   hand surgery  Trauma surgery  Procedures:   none  Antibiotics: Anti-infectives (From admission, onward)   None         HPI/Subjective:  patient states that the pain is controlled from a blood pressure noted to be soft  Objective: Vitals:   10/21/18 2015 10/21/18 2019 10/22/18 0011 10/22/18 0542  BP: (!) 90/49 (!) 98/47  136/66  Pulse: 93   (!) 104  Resp:    20  Temp: (!) 97.4 F (36.3 C)   98.8 F (37.1 C)  TempSrc: Oral   Oral  SpO2: 100%  97% 94%    Intake/Output Summary (Last 24 hours) at 10/22/2018  [place] filed at 10/21/2018 1400 Gross per 24 hour  Intake 240 ml  Output -  Net 240 ml    Exam:  Examination:  General exam: Appears calm and comfortable  Respiratory system: Clear to auscultation. Respiratory effort normal. Cardiovascular system: S1 &  S2 heard, RRR. No JVD, murmurs, rubs, gallops or clicks. No pedal edema. Gastrointestinal system: Abdomen is nondistended, soft and nontender. No organomegaly or masses felt. Normal bowel sounds heard. Central nervous system: Alert and oriented. No focal neurological deficits. Extremities:  Left arm in a sling Skin: No rashes, lesions or ulcers Psychiatry: Judgement and insight appear normal. Mood & affect appropriate.     Data Reviewed: I have personally reviewed following labs and imaging studies  Micro Results Recent Results (from the past 240 hour(s))  MRSA PCR Screening     Status: None   Collection Time: 10/21/18  5:30 PM  Result Value Ref Range Status   MRSA by PCR NEGATIVE NEGATIVE Final    Comment:        The GeneXpert MRSA Assay (FDA approved for NASAL specimens only), is one component of a comprehensive MRSA colonization surveillance program. It is not intended to diagnose MRSA infection nor to guide or monitor treatment for MRSA infections. Performed at [facility], [place] [address], [place], [place] [address]     Radiology Reports Dg Chest 1 View  Result Date: 10/21/2018 CLINICAL DATA:  Status post fall. Concern for chest injury. Initial encounter. EXAM: CHEST  1 VIEW COMPARISON:  Chest radiograph performed 07/20/2018 FINDINGS: The lungs are hyperexpanded, with flattening of the hemidiaphragms, compatible with COPD. Chronic peribronchial thickening is noted. There is no evidence of focal opacification, pleural effusion or pneumothorax. The cardiomediastinal silhouette is within normal limits. No acute osseous abnormalities are seen. IMPRESSION: Findings of COPD. No acute cardiopulmonary process seen. No displaced rib fractures identified. Electronically Signed   By: Garald Balding M.D.   On: 10/21/2018 04:56   Dg Forearm Left  Result Date: 10/21/2018 CLINICAL DATA:  Status post fall, with left wrist pain. Initial encounter. EXAM: LEFT FOREARM - 2 VIEW  COMPARISON:  None. FINDINGS: There is a comminuted, impacted and displaced fracture of the distal radial metaphysis, better characterized on concurrent wrist radiographs. There is also a displaced fracture of the distal ulnar metaphysis. The proximal radius and ulna appear intact. No elbow joint effusion is seen. The carpal rows appear grossly intact. Alignment of the first carpometacarpal joint is not well assessed. IMPRESSION: 1. Comminuted, impacted and displaced fracture of the distal radial metaphysis, better characterized on concurrent wrist radiographs. 2. Displaced fracture of the distal ulnar metaphysis. 3. Alignment of the first carpometacarpal joint is not well assessed. Electronically Signed   By: Garald Balding M.D.   On: 10/21/2018 03:20   Dg Wrist Complete Left  Result Date: 10/21/2018 CLINICAL DATA:  Evaluate for reduction EXAM: LEFT WRIST - COMPLETE 3+ VIEW COMPARISON:  10/21/2018 at 0246 hours FINDINGS: Comminuted intra-articular distal radial/radial styloid fracture. Fracture fragments are in near anatomic alignment and position following reduction. Mild widening of the radiocarpal articulation. Mildly comminuted fracture of the ulnar styloid and distal ulna, in near anatomic alignment and position. Overlying cast obscures fine osseous detail. IMPRESSION: Improved alignment status post reduction of comminuted distal radial and ulnar fractures, as above. Electronically Signed   By: Julian Hy M.D.   On: 10/21/2018 07:30   Dg Wrist Complete Left  Result Date: 10/21/2018 CLINICAL DATA:  Status post fall, with  left wrist pain, acute onset. Initial encounter. EXAM: LEFT WRIST - COMPLETE 3+ VIEW COMPARISON:  None. FINDINGS: There is a comminuted, impacted and significantly dorsally displaced fracture of the distal radial metaphysis, with associated shortening. The carpal rows articulate with the distal fragments. There is also a displaced fracture of the distal ulnar metaphysis. The  carpal rows appear grossly intact. The first carpometacarpal joint is difficult to fully assess, though it appears in grossly normal alignment on the cross-table view. Soft tissue swelling is noted about the wrist. IMPRESSION: 1. Comminuted, impacted and significantly dorsally displaced fracture of the distal radial metaphysis, with associated shortening. 2. Displaced fracture of the distal ulnar metaphysis. Electronically Signed   By: Garald Balding M.D.   On: 10/21/2018 03:22   Dg Pelvis Comp Min 3v  Result Date: 10/21/2018 CLINICAL DATA:  Acetabular fracture. EXAM: JUDET PELVIS - 3+ VIEW COMPARISON:  Left hip x-rays from same day. FINDINGS: Unchanged minimally displaced fracture of the left puboacetabular junction. Minimally displaced fracture of the left inferior pubic ramus. The hip joint spaces are preserved. The pubic symphysis and sacroiliac joints are intact. Osteopenia. Soft tissues are unremarkable. IMPRESSION: 1. Minimally displaced fracture of the left puboacetabular junction without clear extension into the acetabulum. 2. Minimally displaced fracture of the left inferior pubic ramus. Electronically Signed   By: Titus Dubin M.D.   On: 10/21/2018 11:15   Dg Hip Unilat W Or Wo Pelvis 2-3 Views Left  Result Date: 10/21/2018 CLINICAL DATA:  Status post fall, with left hip pain. Initial encounter. EXAM: DG HIP (WITH OR WITHOUT PELVIS) 2-3V LEFT COMPARISON:  None. FINDINGS: Cortical disruption at the level of the left acetabulum raises concern for a minimally displaced fracture. Would correlate for associated symptoms. Both femoral heads are seated normally within their respective acetabula. The proximal left femur appears intact. No significant degenerative change is appreciated. The sacroiliac joints are unremarkable in appearance. The visualized bowel gas pattern is grossly unremarkable in appearance. Diffuse vascular calcifications are seen. IMPRESSION: 1. Cortical disruption at the level of  the left acetabulum raises concern for a minimally displaced fracture. Would correlate for associated symptoms. 2. Diffuse vascular calcifications seen. Electronically Signed   By: Garald Balding M.D.   On: 10/21/2018 03:24     CBC Recent Labs  Lab 10/21/18 0400  WBC 30.8*  HGB 12.7  HCT 43.9  PLT 300  MCV 100.7*  MCH 29.1  MCHC 28.9*  RDW 13.6  LYMPHSABS 0.9  MONOABS 1.5*  EOSABS 0.0  BASOSABS 0.0    Chemistries  Recent Labs  Lab 10/21/18 0400 10/22/18 0346  NA 141 138  K 3.5 3.5  CL 95* 96*  CO2 36* 38*  GLUCOSE 156* 137*  BUN 17 20  CREATININE 0.58 0.64  CALCIUM 9.0 8.3*  MG 1.9  --   AST 18  --   ALT 30  --   ALKPHOS 76  --   BILITOT 0.5  --    ------------------------------------------------------------------------------------------------------------------ CrCl cannot be calculated (Unknown ideal weight.). ------------------------------------------------------------------------------------------------------------------ No results for input(s): HGBA1C in the last 72 hours. ------------------------------------------------------------------------------------------------------------------ No results for input(s): CHOL, HDL, LDLCALC, TRIG, CHOLHDL, LDLDIRECT in the last 72 hours. ------------------------------------------------------------------------------------------------------------------ No results for input(s): TSH, T4TOTAL, T3FREE, THYROIDAB in the last 72 hours.  Invalid input(s): FREET3 ------------------------------------------------------------------------------------------------------------------ No results for input(s): VITAMINB12, FOLATE, FERRITIN, TIBC, IRON, RETICCTPCT in the last 72 hours.  Coagulation profile No results for input(s): INR, PROTIME in the last 168 hours.  No results for input(s): DDIMER in  the last 72 hours.  Cardiac Enzymes No results for input(s): CKMB, TROPONINI, MYOGLOBIN in the last 168 hours.  Invalid input(s):  CK ------------------------------------------------------------------------------------------------------------------ Invalid input(s): POCBNP   CBG: No results for input(s): GLUCAP in the last 168 hours.     Studies: Dg Chest 1 View  Result Date: 10/21/2018 CLINICAL DATA:  Status post fall. Concern for chest injury. Initial encounter. EXAM: CHEST  1 VIEW COMPARISON:  Chest radiograph performed 07/20/2018 FINDINGS: The lungs are hyperexpanded, with flattening of the hemidiaphragms, compatible with COPD. Chronic peribronchial thickening is noted. There is no evidence of focal opacification, pleural effusion or pneumothorax. The cardiomediastinal silhouette is within normal limits. No acute osseous abnormalities are seen. IMPRESSION: Findings of COPD. No acute cardiopulmonary process seen. No displaced rib fractures identified. Electronically Signed   By: Garald Balding M.D.   On: 10/21/2018 04:56   Dg Forearm Left  Result Date: 10/21/2018 CLINICAL DATA:  Status post fall, with left wrist pain. Initial encounter. EXAM: LEFT FOREARM - 2 VIEW COMPARISON:  None. FINDINGS: There is a comminuted, impacted and displaced fracture of the distal radial metaphysis, better characterized on concurrent wrist radiographs. There is also a displaced fracture of the distal ulnar metaphysis. The proximal radius and ulna appear intact. No elbow joint effusion is seen. The carpal rows appear grossly intact. Alignment of the first carpometacarpal joint is not well assessed. IMPRESSION: 1. Comminuted, impacted and displaced fracture of the distal radial metaphysis, better characterized on concurrent wrist radiographs. 2. Displaced fracture of the distal ulnar metaphysis. 3. Alignment of the first carpometacarpal joint is not well assessed. Electronically Signed   By: Garald Balding M.D.   On: 10/21/2018 03:20   Dg Wrist Complete Left  Result Date: 10/21/2018 CLINICAL DATA:  Evaluate for reduction EXAM: LEFT WRIST -  COMPLETE 3+ VIEW COMPARISON:  10/21/2018 at 0246 hours FINDINGS: Comminuted intra-articular distal radial/radial styloid fracture. Fracture fragments are in near anatomic alignment and position following reduction. Mild widening of the radiocarpal articulation. Mildly comminuted fracture of the ulnar styloid and distal ulna, in near anatomic alignment and position. Overlying cast obscures fine osseous detail. IMPRESSION: Improved alignment status post reduction of comminuted distal radial and ulnar fractures, as above. Electronically Signed   By: Julian Hy M.D.   On: 10/21/2018 07:30   Dg Wrist Complete Left  Result Date: 10/21/2018 CLINICAL DATA:  Status post fall, with left wrist pain, acute onset. Initial encounter. EXAM: LEFT WRIST - COMPLETE 3+ VIEW COMPARISON:  None. FINDINGS: There is a comminuted, impacted and significantly dorsally displaced fracture of the distal radial metaphysis, with associated shortening. The carpal rows articulate with the distal fragments. There is also a displaced fracture of the distal ulnar metaphysis. The carpal rows appear grossly intact. The first carpometacarpal joint is difficult to fully assess, though it appears in grossly normal alignment on the cross-table view. Soft tissue swelling is noted about the wrist. IMPRESSION: 1. Comminuted, impacted and significantly dorsally displaced fracture of the distal radial metaphysis, with associated shortening. 2. Displaced fracture of the distal ulnar metaphysis. Electronically Signed   By: Garald Balding M.D.   On: 10/21/2018 03:22   Dg Pelvis Comp Min 3v  Result Date: 10/21/2018 CLINICAL DATA:  Acetabular fracture. EXAM: JUDET PELVIS - 3+ VIEW COMPARISON:  Left hip x-rays from same day. FINDINGS: Unchanged minimally displaced fracture of the left puboacetabular junction. Minimally displaced fracture of the left inferior pubic ramus. The hip joint spaces are preserved. The pubic symphysis and sacroiliac joints are  intact. Osteopenia. Soft tissues are unremarkable. IMPRESSION: 1. Minimally displaced fracture of the left puboacetabular junction without clear extension into the acetabulum. 2. Minimally displaced fracture of the left inferior pubic ramus. Electronically Signed   By: Titus Dubin M.D.   On: 10/21/2018 11:15   Dg Hip Unilat W Or Wo Pelvis 2-3 Views Left  Result Date: 10/21/2018 CLINICAL DATA:  Status post fall, with left hip pain. Initial encounter. EXAM: DG HIP (WITH OR WITHOUT PELVIS) 2-3V LEFT COMPARISON:  None. FINDINGS: Cortical disruption at the level of the left acetabulum raises concern for a minimally displaced fracture. Would correlate for associated symptoms. Both femoral heads are seated normally within their respective acetabula. The proximal left femur appears intact. No significant degenerative change is appreciated. The sacroiliac joints are unremarkable in appearance. The visualized bowel gas pattern is grossly unremarkable in appearance. Diffuse vascular calcifications are seen. IMPRESSION: 1. Cortical disruption at the level of the left acetabulum raises concern for a minimally displaced fracture. Would correlate for associated symptoms. 2. Diffuse vascular calcifications seen. Electronically Signed   By: Garald Balding M.D.   On: 10/21/2018 03:24      No results found for: HGBA1C Lab Results  Component Value Date   LDLCALC 51 05/24/2016   CREATININE 0.64 10/22/2018       Scheduled Meds: . acetaminophen  500 mg Oral Q6H  . amLODipine  5 mg Oral Daily  . enoxaparin (LOVENOX) injection  40 mg Subcutaneous Daily  . ibuprofen  200 mg Oral TID  . mirtazapine  7.5 mg Oral QHS  . mometasone-formoterol  2 puff Inhalation BID  . sodium chloride flush  3 mL Intravenous Q12H   Continuous Infusions: . sodium chloride       LOS: 1 day    Time spent: >30 MINS    Reyne Dumas  [facility] Pager [phone]. If 7PM-7AM, please contact night-coverage at  [email or web address], password [facility] 10/22/2018, 10:07 AM  LOS: 1 day

## 2018-10-22 NOTE — Progress Notes (Signed)
CSW spoke with patient who reports she would like to receive communion. Patient previously a part of [facility] in [place] before moving to [place].   Larene Beach with pastoral care will be following up to assist in finding a catholic priest to assist patient's wishes.   [place], [place]

## 2018-10-22 NOTE — Progress Notes (Signed)
RN called and left a message for Dr. Brennan Bailey office regarding rounding on the patient, her daughter was asking questions about her hand. No answer. Awaiting call back.

## 2018-10-22 NOTE — Care Management Obs Status (Signed)
[place] NOTIFICATION   Patient Details  Name: Sheila Mcconnell MRN: [id number] Date of Birth: 05-03-1927   Medicare Observation Status Notification Given:  Yes    Ninfa Meeker, RN 10/22/2018, 1:35 PM

## 2018-10-22 NOTE — Progress Notes (Addendum)
PT Cancellation Note  Patient Details Name: Sheila Mcconnell MRN: [id number] DOB: May 04, 1927   Cancelled Treatment:    Reason Eval/Treat Not Completed: (P) Medical issues which prohibited therapy. PT deferring evaluation, pending consult with hand surgeon for left wrist fracture. PT seeking clarification on weight bearing status through left elbow for use of platform walker. PT will follow back this afternoon.    Vernell Morgans, SPT Acute Rehabilitation Services Office [phone]    Vernell Morgans 10/22/2018, 9:05 AM

## 2018-10-22 NOTE — Plan of Care (Signed)
  Problem: Coping: Goal: Level of anxiety will decrease Outcome: Progressing   Problem: Pain Managment: Goal: General experience of comfort will improve Outcome: Progressing   Problem: Safety: Goal: Ability to remain free from injury will improve Outcome: Progressing   Problem: Skin Integrity: Goal: Risk for impaired skin integrity will decrease Outcome: Progressing   

## 2018-10-22 NOTE — Progress Notes (Signed)
Chaplain responded to spiritual care consult.  Pt requested Catholic priest.  Bonney Roussel learned that the pt was not a member of a local parish, and informed the pt that a priest would not be able to visit.  Chaplain offered ministry of presence and prayer.  Will follow. Tamsen Snider  [phone]

## 2018-10-22 NOTE — Care Management CC44 (Signed)
Condition Code 44 Documentation Completed  Patient Details  Name: Mescal Flinchbaugh MRN: [id number] Date of Birth: 02/03/1927   Condition Code 44 given:  Yes Patient signature on Condition Code 44 notice:  Yes Documentation of 2 MD's agreement:  Yes Code 44 added to claim:  Yes    Ninfa Meeker, RN 10/22/2018, 1:35 PM

## 2018-10-22 NOTE — Evaluation (Signed)
Physical Therapy Evaluation Patient Details Name: Tylin Stradley MRN: [id number] DOB: 10-17-1927 Today's Date: 10/22/2018   History of Present Illness  Pt is 84 y.o. female presenting with left wrist fracture and left superior and inferior pubic rami fracture secondary to fall at ALF. PMH significant for medical history significant of severe COPD, chronic hypoxic respiratory failure, GERD, hypertension, dyslipidemia, dependence on oxygen, HOH, and osteoporosis.  Clinical Impression  PTA pt was living in [facility], ambulating without use of AD, getting assistance with meals. Patient is A&Ox1, and presents with decreased awareness and memory deficits. She is agreeable to working with therapy, with some increases in pain during mobility. Pt able to complete stand pivot transfer [place] with right knee block, and mod-maxA for bed mobility with LUE restricted. Cuing needed for hand and foot placement during all mobility. Skilled therapy is necessary to address deficits in strength, ROM, balance and endurance to improve functional mobility and decrease risk of falls. PT recommending SNF at this time, and will continue to follow acutely. Pt prefers to return to [facility] which offers skilled rehab according to their website.      Follow Up Recommendations SNF;Supervision/Assistance - 24 hour    Equipment Recommendations  Other (comment)(tbd at next venue)       Precautions / Restrictions Precautions Precautions: Fall Required Braces or Orthoses: Other Brace/Splint Other Brace/Splint: left wrist splint Restrictions Weight Bearing Restrictions: Yes LLE Weight Bearing: Weight bearing as tolerated Other Position/Activity Restrictions: awaiting left wrist consult for left elbow/shoulder weight bearing status for use of platform walker.       Mobility  Bed Mobility Overal bed mobility: Needs Assistance Bed Mobility: Supine to Sit     Supine to sit: Max assist;Mod assist;HOB  elevated     General bed mobility comments: Pt mod to maxA for going from supine to sitting on EOB, limited by use of only one UE, needed assistance with truncal support and used bed pad to scoot to EOB  Transfers Overall transfer level: Needs assistance Equipment used: 1 person hand held assist Transfers: Stand Pivot Transfers   Stand pivot transfers: Mod assist       General transfer comment: Pt ModA with right knee block for powerup and maintenance of stance during transfer to chair, some knee buckling present in right knee, decreased weight shift/weight bearing on left LE, cuing given for sequencing and foot placement during pivot transfer.   Ambulation/Gait             General Gait Details: unable to perform   Balance Overall balance assessment: Needs assistance Sitting-balance support: Feet unsupported;Single extremity supported Sitting balance-Leahy Scale: Fair     Standing balance support: Single extremity supported Standing balance-Leahy Scale: Poor                               Pertinent Vitals/Pain Pain Assessment: Faces Faces Pain Scale: Hurts a little bit Pain Location: left wrist, left pelvis Pain Descriptors / Indicators: Grimacing;Tender;Discomfort Pain Intervention(s): Limited activity within patient's tolerance;Monitored during session;Repositioned    Home Living Family/patient expects to be discharged to:: Assisted living                      Prior Function Level of Independence: Needs assistance   Gait / Transfers Assistance Needed: independent without use of AD  ADL's / Homemaking Assistance Needed: assistance with meals  Extremity/Trunk Assessment   Upper Extremity Assessment Upper Extremity Assessment: LUE deficits/detail LUE Deficits / Details: left wrist fracture s/p fall    Lower Extremity Assessment Lower Extremity Assessment: Generalized weakness;RLE deficits/detail;LLE deficits/detail RLE  Deficits / Details: MMT deficits: 3+/5 hip flexion, 3+/5 knee flexion, 4-/5 knee extension LLE Deficits / Details: L pelvic fracture s/p fall limiting assessment, some bruising noted on knee LLE: Unable to fully assess due to pain    Cervical / Trunk Assessment Cervical / Trunk Assessment: Normal  Communication   Communication: HOH  Cognition Arousal/Alertness: Awake/alert Behavior During Therapy: WFL for tasks assessed/performed Overall Cognitive Status: Impaired/Different from baseline Area of Impairment: Orientation;Memory;Safety/judgement;Awareness                 Orientation Level: Disoriented to;Place;Time;Situation   Memory: Decreased short-term memory   Safety/Judgement: Decreased awareness of safety;Decreased awareness of deficits     General Comments: Pt shows decreased awareness of deficits and is disoriented to time, place, and situation. She was able to say that she was in a hospital but didn't know which one, and asked multiple times if she had surgery for her left wrist. She knew it was monday but didn't know the date. Pt was unaware of pelvic ring fracture and did not recall fall.       General Comments General comments (skin integrity, edema, etc.): Pt has some bruising on bilateral LE, and fingers of LUE have edema and purple discoloration. Pt has pain with trying to move LUE fingers. Pt with 2/4 dyspnea following transfer to chair, reports feeling tired, but says she is not in pain at end of session. Pt with 2 Liters supplemental oxygen delivered by nasal cannula for duration of treatment session. Pt uses supplemental oxygen at baseline.         Assessment/Plan    PT Assessment Patient needs continued PT services  PT Problem List Decreased range of motion;Decreased strength;Decreased activity tolerance;Decreased balance;Decreased mobility;Decreased coordination;Decreased cognition;Decreased knowledge of use of DME;Decreased safety awareness;Pain       PT  Treatment Interventions DME instruction;Gait training;Functional mobility training;Therapeutic activities;Therapeutic exercise;Balance training;Neuromuscular re-education;Cognitive remediation    PT Goals (Current goals can be found in the Care Plan section)  Acute Rehab PT Goals Patient Stated Goal: "go home" (ALF) PT Goal Formulation: With patient Time For Goal Achievement: 11/05/18 Potential to Achieve Goals: Fair    Frequency Min 3X/week    AM-PAC PT "6 Clicks" Daily Activity  Outcome Measure Difficulty turning over in bed (including adjusting bedclothes, sheets and blankets)?: Unable Difficulty moving from lying on back to sitting on the side of the bed? : Unable Difficulty sitting down on and standing up from a chair with arms (e.g., wheelchair, bedside commode, etc,.)?: Unable Help needed moving to and from a bed to chair (including a wheelchair)?: Total Help needed walking in hospital room?: Total Help needed climbing 3-5 steps with a railing? : Total 6 Click Score: 6    End of Session Equipment Utilized During Treatment: Gait belt;Oxygen Activity Tolerance: Patient tolerated treatment well Patient left: in chair;with call bell/phone within reach;with chair alarm set Nurse Communication: Mobility status PT Visit Diagnosis: Unsteadiness on feet (R26.81);History of falling (Z91.81);Muscle weakness (generalized) (M62.81);Other abnormalities of gait and mobility (R26.89);Pain Pain - Right/Left: Left Pain - part of body: Arm;Hand;Leg    Time: 7017-7939 PT Time Calculation (min) (ACUTE ONLY): 19 min   Charges:   PT Evaluation $PT Eval Moderate Complexity: 1 Mod          Treyon Wymore  Theda Sers, SPT Acute Rehabilitation Services Office [phone]   Vernell Morgans 10/22/2018, 3:38 PM

## 2018-10-23 ENCOUNTER — Observation Stay (HOSPITAL_COMMUNITY): Payer: Medicare HMO

## 2018-10-23 DIAGNOSIS — S52552A Other extraarticular fracture of lower end of left radius, initial encounter for closed fracture: Secondary | ICD-10-CM | POA: Diagnosis not present

## 2018-10-23 DIAGNOSIS — S52502A Unspecified fracture of the lower end of left radius, initial encounter for closed fracture: Secondary | ICD-10-CM | POA: Diagnosis not present

## 2018-10-23 DIAGNOSIS — S32402D Unspecified fracture of left acetabulum, subsequent encounter for fracture with routine healing: Secondary | ICD-10-CM | POA: Diagnosis not present

## 2018-10-23 LAB — CBC
HEMATOCRIT: 36.4 % (ref 36.0–46.0)
Hemoglobin: 10.4 g/dL — ABNORMAL LOW (ref 12.0–15.0)
MCH: 28.9 pg (ref 26.0–34.0)
MCHC: 28.6 g/dL — AB (ref 30.0–36.0)
MCV: 101.1 fL — ABNORMAL HIGH (ref 80.0–100.0)
Platelets: 207 10*3/uL (ref 150–400)
RBC: 3.6 MIL/uL — AB (ref 3.87–5.11)
RDW: 13.3 % (ref 11.5–15.5)
WBC: 17.2 10*3/uL — AB (ref 4.0–10.5)
nRBC: 0 % (ref 0.0–0.2)

## 2018-10-23 LAB — VITAMIN D 25 HYDROXY (VIT D DEFICIENCY, FRACTURES): Vit D, 25-Hydroxy: 20.4 ng/mL — ABNORMAL LOW (ref 30.0–100.0)

## 2018-10-23 MED ORDER — DOCUSATE SODIUM 100 MG PO CAPS
200.0000 mg | ORAL_CAPSULE | Freq: Once | ORAL | Status: AC
  Administered 2018-10-24: 200 mg via ORAL
  Filled 2018-10-23: qty 2

## 2018-10-23 MED ORDER — NALOXONE HCL 0.4 MG/ML IJ SOLN
0.4000 mg | INTRAMUSCULAR | Status: DC | PRN
  Administered 2018-10-23: 0.4 mg via INTRAVENOUS
  Filled 2018-10-23: qty 1

## 2018-10-23 MED ORDER — DOCUSATE SODIUM 100 MG PO CAPS
100.0000 mg | ORAL_CAPSULE | Freq: Two times a day (BID) | ORAL | Status: DC
  Administered 2018-10-24 – 2018-10-27 (×7): 100 mg via ORAL
  Filled 2018-10-23 (×7): qty 1

## 2018-10-23 NOTE — Progress Notes (Addendum)
[facility] PROGRESS NOTE  Sheila Mcconnell MBT:[id number] DOB: 23-Dec-1926 DOA: 10/21/2018   PCP: Ma Hillock, DO     Assessment/Plan: Active Problems:   Left wrist fracture   Wrist fracture   84 y.o. female with medical history significant of severe COPD, chronic hypoxic respiratory failure, GERD, hypertension, dyslipidemia and osteoporosis. Patient admitted on 11/17 after a fall. Found to have left superior and inferior pubic rami fracture, left distal radius fracture, seen by trauma surgery, orthopedics. Urine analysis negative for infection.  Chest radiograph with significant hyperinflation with positive lung markings bilaterally.  Forearm x-rays with comminuted, impacted and displaced fracture of the distal radial metaphysis.  Displaced fracture of the distal ulnar metaphysis.  Cortical disruption at the level of the left acetabulum, raising concern about minimal displaced fracture.   Assessment and plan 84 year old female who sustained a mechanical fall without head trauma or loss of consciousness, injuring her left wrist.  Severe pain post trauma, not able to ambulate.  Urine analysis negative for infection.  Chest radiograph with significant hyperinflation with positive lung markings bilaterally.  Forearm x-rays with comminuted, impacted and displaced fracture of the distal radial metaphysis.  Displaced fracture of the distal ulnar metaphysis.  Cortical disruption at the level of the left acetabulum, raising concern about minimal displaced fracture.   Patient will be admitted to the hospital with working diagnosis of displaced fracture of the distal radial and ulnar metaphysis.  1.  Acute left wrist fracture, displaced distal radial and ulnar metaphysis.  Patient   Admitted for observation , continue pain control with acetaminophen .  Follow  orthopedic hand surgery consultation and physical therapy recommendations.   For Inferior pubic rami fracture, orthopedics  recommends weightbearing as tolerated, will require platform walker due to her left distal radius fracture. For  left distal radius fracture , waiting to see hand surgery Dr. Lenon Curt, tried to reach him yesterday but office phone not accepting  messages. Discussed with Dr Marcelino Scot  Who is willing to take care of patients'  left radial fracture. He recommended repeat x-rays which were ordered and he will then make further recommendations. Minimize narcotic medications as the patient was easily oversedated by  them. Use only Tylenol.   2.  Hypertension. Held amlodipine has a blood pressure soft.  Hold furosemide for now to avoid attention and hypovolemia   3.  Severe COPD with reactive leukocytosis.  No acute exacerbation, continue bronchodilator with with desonide/formoterol, DuoNeb and tiotropium.  Patient recently on systemic steroids,presented with leukocytosis on admission, no pneumonia. follow-up cell count in the morning, hold antibiotics. Leukocytosis improving. Repeat chest x-rays shows chronic interstitial prominence, no pneumonia or CHF.   4.  Depression.  Continue mirtazapine.  5. acute Metabolic encephalopathy due to narcotics , requiring narcan, she needed SLP eval to ensure she was cleared to resume diet and was not at risk of aspiration   DVT prophylaxsis  Lovenox  Code Status:  DO NOT RESUSCITATE   Family Communication: Discussed in detail with the patient, all imaging results, lab results explained to the patient   Disposition Plan:   Patient to be evaluated by Dr. Marcelino Scot today.  We'll also need SNF, she is from ALF       Consultants:   hand surgery  Trauma surgery  Procedures:   none  Antibiotics: Anti-infectives (From admission, onward)   None         HPI/Subjective: Patient noted to be obtunded this morning, needed 2 doses  of Narcan to wake her up this morning avoid narcotics, discussed with the patient's daughter by the bedside  Objective: Vitals:    10/22/18 1306 10/22/18 2006 10/22/18 2021 10/23/18 0805  BP: (!) 101/54  (!) 116/57 116/62  Pulse: 92  97 91  Resp: 20   15  Temp:   97.8 F (36.6 C) 97.7 F (36.5 C)  TempSrc:   Oral   SpO2: 97% 96% 100% 91%    Intake/Output Summary (Last 24 hours) at 10/23/2018 1023 Last data filed at 10/23/2018 0100 Gross per 24 hour  Intake -  Output 265 ml  Net -265 ml    Exam:  Examination:  General exam: Appears calm and comfortable  Respiratory system: Clear to auscultation. Respiratory effort normal. Cardiovascular system: S1 & S2 heard, RRR. No JVD, murmurs, rubs, gallops or clicks. No pedal edema. Gastrointestinal system: Abdomen is nondistended, soft and nontender. No organomegaly or masses felt. Normal bowel sounds heard. Central nervous system: Alert and oriented. No focal neurological deficits. Extremities:  Left arm in a sling Skin: No rashes, lesions or ulcers Psychiatry: Judgement and insight appear normal. Mood & affect appropriate.     Data Reviewed: I have personally reviewed following labs and imaging studies  Micro Results Recent Results (from the past 240 hour(s))  MRSA PCR Screening     Status: None   Collection Time: 10/21/18  5:30 PM  Result Value Ref Range Status   MRSA by PCR NEGATIVE NEGATIVE Final    Comment:        The GeneXpert MRSA Assay (FDA approved for NASAL specimens only), is one component of a comprehensive MRSA colonization surveillance program. It is not intended to diagnose MRSA infection nor to guide or monitor treatment for MRSA infections. Performed at [facility], [place] [address] [place], [place] [address]     Radiology Reports Dg Chest 1 View  Result Date: 10/21/2018 CLINICAL DATA:  Status post fall. Concern for chest injury. Initial encounter. EXAM: CHEST  1 VIEW COMPARISON:  Chest radiograph performed 07/20/2018 FINDINGS: The lungs are hyperexpanded, with flattening of the hemidiaphragms, compatible with COPD. Chronic  peribronchial thickening is noted. There is no evidence of focal opacification, pleural effusion or pneumothorax. The cardiomediastinal silhouette is within normal limits. No acute osseous abnormalities are seen. IMPRESSION: Findings of COPD. No acute cardiopulmonary process seen. No displaced rib fractures identified. Electronically Signed   By: Garald Balding M.D.   On: 10/21/2018 04:56   Dg Forearm Left  Result Date: 10/21/2018 CLINICAL DATA:  Status post fall, with left wrist pain. Initial encounter. EXAM: LEFT FOREARM - 2 VIEW COMPARISON:  None. FINDINGS: There is a comminuted, impacted and displaced fracture of the distal radial metaphysis, better characterized on concurrent wrist radiographs. There is also a displaced fracture of the distal ulnar metaphysis. The proximal radius and ulna appear intact. No elbow joint effusion is seen. The carpal rows appear grossly intact. Alignment of the first carpometacarpal joint is not well assessed. IMPRESSION: 1. Comminuted, impacted and displaced fracture of the distal radial metaphysis, better characterized on concurrent wrist radiographs. 2. Displaced fracture of the distal ulnar metaphysis. 3. Alignment of the first carpometacarpal joint is not well assessed. Electronically Signed   By: Garald Balding M.D.   On: 10/21/2018 03:20   Dg Wrist Complete Left  Result Date: 10/21/2018 CLINICAL DATA:  Evaluate for reduction EXAM: LEFT WRIST - COMPLETE 3+ VIEW COMPARISON:  10/21/2018 at 0246 hours FINDINGS: Comminuted intra-articular distal radial/radial styloid fracture.  Fracture fragments are in near anatomic alignment and position following reduction. Mild widening of the radiocarpal articulation. Mildly comminuted fracture of the ulnar styloid and distal ulna, in near anatomic alignment and position. Overlying cast obscures fine osseous detail. IMPRESSION: Improved alignment status post reduction of comminuted distal radial and ulnar fractures, as above.  Electronically Signed   By: Julian Hy M.D.   On: 10/21/2018 07:30   Dg Wrist Complete Left  Result Date: 10/21/2018 CLINICAL DATA:  Status post fall, with left wrist pain, acute onset. Initial encounter. EXAM: LEFT WRIST - COMPLETE 3+ VIEW COMPARISON:  None. FINDINGS: There is a comminuted, impacted and significantly dorsally displaced fracture of the distal radial metaphysis, with associated shortening. The carpal rows articulate with the distal fragments. There is also a displaced fracture of the distal ulnar metaphysis. The carpal rows appear grossly intact. The first carpometacarpal joint is difficult to fully assess, though it appears in grossly normal alignment on the cross-table view. Soft tissue swelling is noted about the wrist. IMPRESSION: 1. Comminuted, impacted and significantly dorsally displaced fracture of the distal radial metaphysis, with associated shortening. 2. Displaced fracture of the distal ulnar metaphysis. Electronically Signed   By: Garald Balding M.D.   On: 10/21/2018 03:22   Dg Pelvis Comp Min 3v  Result Date: 10/21/2018 CLINICAL DATA:  Acetabular fracture. EXAM: JUDET PELVIS - 3+ VIEW COMPARISON:  Left hip x-rays from same day. FINDINGS: Unchanged minimally displaced fracture of the left puboacetabular junction. Minimally displaced fracture of the left inferior pubic ramus. The hip joint spaces are preserved. The pubic symphysis and sacroiliac joints are intact. Osteopenia. Soft tissues are unremarkable. IMPRESSION: 1. Minimally displaced fracture of the left puboacetabular junction without clear extension into the acetabulum. 2. Minimally displaced fracture of the left inferior pubic ramus. Electronically Signed   By: Titus Dubin M.D.   On: 10/21/2018 11:15   Dg Hip Unilat W Or Wo Pelvis 2-3 Views Left  Result Date: 10/21/2018 CLINICAL DATA:  Status post fall, with left hip pain. Initial encounter. EXAM: DG HIP (WITH OR WITHOUT PELVIS) 2-3V LEFT COMPARISON:   None. FINDINGS: Cortical disruption at the level of the left acetabulum raises concern for a minimally displaced fracture. Would correlate for associated symptoms. Both femoral heads are seated normally within their respective acetabula. The proximal left femur appears intact. No significant degenerative change is appreciated. The sacroiliac joints are unremarkable in appearance. The visualized bowel gas pattern is grossly unremarkable in appearance. Diffuse vascular calcifications are seen. IMPRESSION: 1. Cortical disruption at the level of the left acetabulum raises concern for a minimally displaced fracture. Would correlate for associated symptoms. 2. Diffuse vascular calcifications seen. Electronically Signed   By: Garald Balding M.D.   On: 10/21/2018 03:24     CBC Recent Labs  Lab 10/21/18 0400 10/22/18 1042 10/23/18 0147  WBC 30.8* 16.5* 17.2*  HGB 12.7 10.7* 10.4*  HCT 43.9 35.8* 36.4  PLT 300 192 207  MCV 100.7* 100.6* 101.1*  MCH 29.1 30.1 28.9  MCHC 28.9* 29.9* 28.6*  RDW 13.6 13.5 13.3  LYMPHSABS 0.9  --   --   MONOABS 1.5*  --   --   EOSABS 0.0  --   --   BASOSABS 0.0  --   --     Chemistries  Recent Labs  Lab 10/21/18 0400 10/22/18 0346  NA 141 138  K 3.5 3.5  CL 95* 96*  CO2 36* 38*  GLUCOSE 156* 137*  BUN 17 20  CREATININE 0.58 0.64  CALCIUM 9.0 8.3*  MG 1.9  --   AST 18  --   ALT 30  --   ALKPHOS 76  --   BILITOT 0.5  --    ------------------------------------------------------------------------------------------------------------------ CrCl cannot be calculated (Unknown ideal weight.). ------------------------------------------------------------------------------------------------------------------ No results for input(s): HGBA1C in the last 72 hours. ------------------------------------------------------------------------------------------------------------------ No results for input(s): CHOL, HDL, LDLCALC, TRIG, CHOLHDL, LDLDIRECT in the last 72  hours. ------------------------------------------------------------------------------------------------------------------ No results for input(s): TSH, T4TOTAL, T3FREE, THYROIDAB in the last 72 hours.  Invalid input(s): FREET3 ------------------------------------------------------------------------------------------------------------------ No results for input(s): VITAMINB12, FOLATE, FERRITIN, TIBC, IRON, RETICCTPCT in the last 72 hours.  Coagulation profile No results for input(s): INR, PROTIME in the last 168 hours.  No results for input(s): DDIMER in the last 72 hours.  Cardiac Enzymes No results for input(s): CKMB, TROPONINI, MYOGLOBIN in the last 168 hours.  Invalid input(s): CK ------------------------------------------------------------------------------------------------------------------ Invalid input(s): POCBNP   CBG: No results for input(s): GLUCAP in the last 168 hours.     Studies: No results found.    No results found for: HGBA1C Lab Results  Component Value Date   LDLCALC 51 05/24/2016   CREATININE 0.64 10/22/2018       Scheduled Meds: . acetaminophen  500 mg Oral Q6H  . enoxaparin (LOVENOX) injection  40 mg Subcutaneous Daily  . mirtazapine  7.5 mg Oral QHS  . mometasone-formoterol  2 puff Inhalation BID  . sodium chloride flush  3 mL Intravenous Q12H   Continuous Infusions: . sodium chloride       LOS: 1 day    Time spent: >30 MINS    Reyne Dumas  [facility] Pager [phone]. If 7PM-7AM, please contact night-coverage at [email or web address], password [facility] 10/23/2018, 10:23 AM  LOS: 1 day

## 2018-10-23 NOTE — Evaluation (Signed)
Occupational Therapy Evaluation Patient Details Name: Sheila Mcconnell MRN: [id number] DOB: January 07, 1927 Today's Date: 10/23/2018    History of Present Illness Pt is 84 y.o. female presenting with left wrist fracture and left superior and inferior pubic rami fracture secondary to fall at ALF. PMH significant for medical history significant of severe COPD, chronic hypoxic respiratory failure, GERD, hypertension, dyslipidemia, dependence on oxygen, HOH, and osteoporosis.   Clinical Impression   Pt admitted as above currently demonstrating deficits in her ability to perform ADL's and functional transfers (Please refer to OT problem list). Pt was lethargic, disoriented to place, time and situation but easily arousable and agreeable to transfer from bed to chair. Overall Mod A - total assist for ADL's. Nurse tech made aware that pt was up in chair with chair alarm on, transfer status and x-ray in room at conclusion of OT session.    Follow Up Recommendations  SNF;Supervision/Assistance - 24 hour    Equipment Recommendations  Other (comment)(Defer to next venue)    Recommendations for Other Services       Precautions / Restrictions Precautions Precautions: Fall;Other (comment)(DNR) Required Braces or Orthoses: Other Brace/Splint Other Brace/Splint: left wrist splint Restrictions Weight Bearing Restrictions: Yes LUE Weight Bearing: Non weight bearing LLE Weight Bearing: Weight bearing as tolerated Other Position/Activity Restrictions: awaiting left wrist consult for weight bearing status      Mobility Bed Mobility Overal bed mobility: Needs Assistance Bed Mobility: Supine to Sit     Supine to sit: Max assist;Mod assist;HOB elevated     General bed mobility comments: Pt mod to maxA for going from supine to sitting on EOB, limited by use of only one UE, needed assistance with truncal support and used bed pad to scoot to EOB  Transfers Overall transfer level: Needs  assistance Equipment used: 1 person hand held assist Transfers: Stand Pivot Transfers   Stand pivot transfers: Mod assist       General transfer comment: Pt ModA with knee block for powerup and maintenance of stance during transfer to chair, Min verbal cues given for sequencing and foot placement during pivot transfer.     Balance Overall balance assessment: Needs assistance Sitting-balance support: Feet unsupported;Single extremity supported Sitting balance-Leahy Scale: Fair     Standing balance support: Single extremity supported Standing balance-Leahy Scale: Poor                             ADL either performed or assessed with clinical judgement   ADL Overall ADL's : Needs assistance/impaired Eating/Feeding: Maximal assistance;Bed level   Grooming: Maximal assistance;Sitting;Bed level   Upper Body Bathing: Maximal assistance;Sitting Upper Body Bathing Details (indicate cue type and reason): Secondary to confusion Lower Body Bathing: +2 for physical assistance;+2 for safety/equipment;Sit to/from stand;Moderate assistance   Upper Body Dressing : Maximal assistance;Sitting   Lower Body Dressing: +2 for physical assistance;+2 for safety/equipment;Sit to/from stand;Moderate assistance   Toilet Transfer: Moderate assistance;Cueing for safety;Cueing for sequencing;Stand-pivot;BSC(Simulated transfer from Bed to recliner chair)   Toileting- Clothing Manipulation and Hygiene: Total assistance;+2 for physical assistance;+2 for safety/equipment;Sit to/from stand       Functional mobility during ADLs: (Pt not safe for ambulation at this time. Awaiting clarity on LUE (elbow/shoulder WB status for possible platform walker) General ADL Comments: Pt seen for OT assessment today with focus on pt education, ADL retraining for bed mobility and SPT in preparation for increased participation in ADL's. Pt lethargic, confused but easily arousable and agreeable to  transfer from bed  to chair. Pt will need SNF Rehab and 24/7 assist. Nurse tech made aware that pt was up in chair with chair alarm on, transfer status and x-ray in room at conclusion of OT session.     Vision Baseline Vision/History: Wears glasses(Pt reports she wears glasses for reading. No family present) Wears Glasses: Reading only Patient Visual Report: (Pt reports she wears glasses for reading. No family present)       Perception     Praxis      Pertinent Vitals/Pain Pain Assessment: Faces Faces Pain Scale: Hurts little more Pain Location: left wrist, left pelvis Pain Descriptors / Indicators: Grimacing;Tender;Discomfort Pain Intervention(s): Limited activity within patient's tolerance;Monitored during session;Repositioned     Hand Dominance Right   Extremity/Trunk Assessment Upper Extremity Assessment Upper Extremity Assessment: Generalized weakness;LUE deficits/detail LUE Deficits / Details: left wrist fracture s/p fall LUE: Unable to fully assess due to immobilization(L forearm Splint)   Lower Extremity Assessment Lower Extremity Assessment: Defer to PT evaluation   Cervical / Trunk Assessment Cervical / Trunk Assessment: Normal   Communication Communication Communication: HOH   Cognition Arousal/Alertness: Lethargic;Awake/alert Behavior During Therapy: WFL for tasks assessed/performed Overall Cognitive Status: Impaired/Different from baseline Area of Impairment: Orientation;Memory;Safety/judgement;Awareness                 Orientation Level: Disoriented to;Place;Time;Situation   Memory: Decreased short-term memory   Safety/Judgement: Decreased awareness of safety;Decreased awareness of deficits     General Comments: Pt shows decreased awareness of deficits and is disoriented to time, place, and situation. She was unable to say that she was in a hospital & unable to recall after education and asked multiple times where she was. Pt was unaware of L wrist fracture &  pelvic ring fracture and did not recall fall.    General Comments  Pt with bruising noted LLE, fungers. Edema noted & purple discoloration of digits L hand. Decreased cognition, no family present for baseline level of function. Decreased awareness of deficits, disoritented to time, place and situation, unaware of recent fall and unaware of L wrist fracture as well as L pelvic ring fracture today,    Exercises     Shoulder Instructions      Home Living Family/patient expects to be discharged to:: Assisted living                             Home Equipment: Walker - 2 wheels;Cane - single point          Prior Functioning/Environment Level of Independence: Needs assistance  Gait / Transfers Assistance Needed: independent without use of AD ADL's / Homemaking Assistance Needed: assistance with meals            OT Problem List: Decreased activity tolerance;Decreased cognition;Decreased strength;Decreased knowledge of use of DME or AE;Impaired UE functional use;Increased edema;Impaired balance (sitting and/or standing);Decreased range of motion;Decreased coordination;Decreased safety awareness;Decreased knowledge of precautions;Pain      OT Treatment/Interventions: Self-care/ADL training;DME and/or AE instruction;Therapeutic activities;Patient/family education(Balance & cognition to be addressed in functional context)    OT Goals(Current goals can be found in the care plan section) Acute Rehab OT Goals Patient Stated Goal: Pt unable to state OT Goal Formulation: Patient unable to participate in goal setting Time For Goal Achievement: 11/06/18 Potential to Achieve Goals: Fair  OT Frequency: Min 2X/week   Barriers to D/C:            Co-evaluation  AM-PAC PT "6 Clicks" Daily Activity     Outcome Measure Help from another person eating meals?: Total Help from another person taking care of personal grooming?: A Lot Help from another person toileting,  which includes using toliet, bedpan, or urinal?: Total Help from another person bathing (including washing, rinsing, drying)?: Total Help from another person to put on and taking off regular upper body clothing?: A Lot Help from another person to put on and taking off regular lower body clothing?: Total 6 Click Score: 8   End of Session Equipment Utilized During Treatment: Gait belt Nurse Communication: Mobility status;Other (comment)(Pt up in chair, chair alarm on & x-ray tech in room. Increased confusion)  Activity Tolerance: Patient tolerated treatment well Patient left: in chair;with call bell/phone within reach;with chair alarm set  OT Visit Diagnosis: Muscle weakness (generalized) (M62.81);Unsteadiness on feet (R26.81);Pain Pain - Right/Left: Left Pain - part of body: Arm(Arm & Pelvis)                Time: 9381-0175 OT Time Calculation (min): 20 min Charges:  OT General Charges $OT Visit: 1 Visit OT Evaluation $OT Eval Moderate Complexity: 1 Mod OT Treatments $Self Care/Home Management : 8-22 mins   Ruthetta Koopmann Beth Dixon, OTR/L 10/23/2018, 11:19 AM

## 2018-10-23 NOTE — Progress Notes (Signed)
CSW received SNF consult.   CSW spoke with daughter who prefers patient return to ALF at [facility] with home health, which they have done in the past. [facility] does not take patient's insurance for SNF side, therefore they would have to relocate patient to a different SNF before returning to [facility], which daughter would prefer not to.   CSW awaiting PT/OT recommendations after patient's hand has been assessed.   CSW will continue to follow up.   [place], [place]

## 2018-10-23 NOTE — Evaluation (Signed)
Clinical/Bedside Swallow Evaluation Patient Details  Name: Sheila Mcconnell MRN: [id number] Date of Birth: 11-18-1927  Today's Date: 10/23/2018 Time: SLP Start Time (ACUTE ONLY): 21 SLP Stop Time (ACUTE ONLY): 1115 SLP Time Calculation (min) (ACUTE ONLY): 10 min  Past Medical History:  Past Medical History:  Diagnosis Date  . Allergy   . Cataracts, bilateral   . Dependence on supplemental oxygen    3.5l (pts stated 2.5 LNC), [employer] services  . Emphysema of lung ([place])   . GERD (gastroesophageal reflux disease)   . Hyperlipidemia   . Hypertension   . Osteoporosis    had been on [place]   Past Surgical History:  Past Surgical History:  Procedure Laterality Date  . NO PAST SURGERIES     HPI:  Pt is 84 y.o. female presenting with left wrist fracture and left superior and inferior pubic rami fracture secondary to fall at ALF. PMH significant for medical history significant of severe COPD, chronic hypoxic respiratory failure, GERD, hypertension, dyslipidemia, dependence on oxygen, HOH, and osteoporosis.   Assessment / Plan / Recommendation Clinical Impression  Per daughter, pt was not alert this am and could not eat.  At this time, pt is alert, interactive, accepted POs with adequate attention and mastication, brisk swallow response, no overt s/s of aspiration.  Continue current diet - no SLP intervention is warranted.  Our service will sign off.  SLP Visit Diagnosis: Dysphagia, unspecified (R13.10)    Aspiration Risk  No limitations    Diet Recommendation   regular solids, thin liquids  Medication Administration: Whole meds with liquid    Other  Recommendations Oral Care Recommendations: Oral care BID   Follow up Recommendations None      Frequency and Duration            Prognosis        Swallow Study   General Date of Onset: 10/21/18 HPI: Pt is 84 y.o. female presenting with left wrist fracture and left superior and inferior pubic rami fracture secondary to fall at  ALF. PMH significant for medical history significant of severe COPD, chronic hypoxic respiratory failure, GERD, hypertension, dyslipidemia, dependence on oxygen, HOH, and osteoporosis. Type of Study: Bedside Swallow Evaluation Previous Swallow Assessment: no Diet Prior to this Study: Regular;Thin liquids Temperature Spikes Noted: No Respiratory Status: Nasal cannula History of Recent Intubation: No Behavior/Cognition: Alert Oral Cavity Assessment: Within Functional Limits Oral Care Completed by SLP: No Oral Cavity - Dentition: Adequate natural dentition Vision: Functional for self-feeding Self-Feeding Abilities: Able to feed self Patient Positioning: Upright in chair Baseline Vocal Quality: Normal Volitional Cough: Strong Volitional Swallow: Able to elicit    Oral/Motor/Sensory Function Overall Oral Motor/Sensory Function: Within functional limits   Ice Chips Ice chips: Within functional limits   Thin Liquid Thin Liquid: Within functional limits    Nectar Thick Nectar Thick Liquid: Not tested   Honey Thick Honey Thick Liquid: Not tested   Puree Puree: Within functional limits   Solid     Solid: Within functional limits      Juan Quam Laurice 10/23/2018,11:17 AM    Sheila Mcconnell, [place] Office number [phone] Pager [phone]

## 2018-10-23 NOTE — Progress Notes (Signed)
Pt very out of it this AM, sleeping but easily arousable. Not eating or drinking much this AM tried to feed breakfast did not want it. Only took a sip of water. Will notfiy MD will continue to monitor.

## 2018-10-23 NOTE — Progress Notes (Signed)
RT was notified for ABG. Patient is a DNR. RT spoke with patients daughter Sheila Mcconnell about risks and benefits of ABG and plan of care if ABG results were bad. Sheila Mcconnell is on board with BiPAP if needed. Patient is now much more aware and alert. Patient is up in chair, answering questions. She knows her full name and where she is. Daughter is aware to let RN know if mental status declines again. RN will notify RT and blood gas will be obtained. RT will continue to monitor.

## 2018-10-24 DIAGNOSIS — S52552A Other extraarticular fracture of lower end of left radius, initial encounter for closed fracture: Secondary | ICD-10-CM | POA: Diagnosis not present

## 2018-10-24 DIAGNOSIS — S62102D Fracture of unspecified carpal bone, left wrist, subsequent encounter for fracture with routine healing: Secondary | ICD-10-CM

## 2018-10-24 LAB — DIFFERENTIAL
Abs Immature Granulocytes: 0.09 10*3/uL — ABNORMAL HIGH (ref 0.00–0.07)
BASOS PCT: 0 %
Basophils Absolute: 0 10*3/uL (ref 0.0–0.1)
EOS ABS: 0.2 10*3/uL (ref 0.0–0.5)
Eosinophils Relative: 1 %
Immature Granulocytes: 1 %
Lymphocytes Relative: 3 %
Lymphs Abs: 0.5 10*3/uL — ABNORMAL LOW (ref 0.7–4.0)
MONO ABS: 1.3 10*3/uL — AB (ref 0.1–1.0)
MONOS PCT: 8 %
NEUTROS PCT: 87 %
Neutro Abs: 14.3 10*3/uL — ABNORMAL HIGH (ref 1.7–7.7)

## 2018-10-24 LAB — CBC
HEMATOCRIT: 36.5 % (ref 36.0–46.0)
Hemoglobin: 10.4 g/dL — ABNORMAL LOW (ref 12.0–15.0)
MCH: 28.5 pg (ref 26.0–34.0)
MCHC: 28.5 g/dL — ABNORMAL LOW (ref 30.0–36.0)
MCV: 100 fL (ref 80.0–100.0)
NRBC: 0 % (ref 0.0–0.2)
Platelets: 236 10*3/uL (ref 150–400)
RBC: 3.65 MIL/uL — AB (ref 3.87–5.11)
RDW: 13.4 % (ref 11.5–15.5)
WBC: 16.5 10*3/uL — ABNORMAL HIGH (ref 4.0–10.5)

## 2018-10-24 LAB — COMPREHENSIVE METABOLIC PANEL
ALT: 16 U/L (ref 0–44)
AST: 16 U/L (ref 15–41)
Albumin: 2.6 g/dL — ABNORMAL LOW (ref 3.5–5.0)
Alkaline Phosphatase: 68 U/L (ref 38–126)
Anion gap: 12 (ref 5–15)
BUN: 14 mg/dL (ref 8–23)
CALCIUM: 8.7 mg/dL — AB (ref 8.9–10.3)
CO2: 31 mmol/L (ref 22–32)
CREATININE: 0.61 mg/dL (ref 0.44–1.00)
Chloride: 93 mmol/L — ABNORMAL LOW (ref 98–111)
GFR calc Af Amer: >60 mL/min (ref >60)
Glucose, Bld: 94 mg/dL (ref 70–99)
Potassium: 4.1 mmol/L (ref 3.5–5.1)
Sodium: 136 mmol/L (ref 135–145)
TOTAL PROTEIN: 5.5 g/dL — AB (ref 6.5–8.1)
Total Bilirubin: 1.9 mg/dL — ABNORMAL HIGH (ref 0.3–1.2)

## 2018-10-24 MED ORDER — DOCUSATE SODIUM 100 MG PO CAPS: 100.0000 mg | ORAL_CAPSULE | Freq: Two times a day (BID) | ORAL | 0 refills | Status: AC

## 2018-10-24 NOTE — Discharge Summary (Signed)
Physician Discharge Summary  Sheila Mcconnell CWC:[id number] DOB: [birth year] DOA: 10/21/2018  PCP: Ma Hillock, DO  Admit date: 10/21/2018 Discharge date: 10/24/2018  Admitted From: ALF Disposition: [place].  Recommendations for Outpatient Follow-up:  1. Follow up with PCP in 1-2 weeks 2. Please obtain BMP/CBC in one week 3. F/u within one week of discharge for check of left wrist and possible splint/ cast change by Dr handy.      Discharge Condition:STABLE.  CODE STATUS: DNR Diet recommendation: Heart Healthy  Brief/Interim Summary: 84 y.o.femalewith medical history significant ofsevere COPD, chronic hypoxic respiratory failure, GERD, hypertension, dyslipidemia and osteoporosis. Patient admitted on 11/17 after a fall. Found to have left superior and inferior pubic rami fracture, left distal radius fracture, seen by trauma surgery, orthopedics. Urine analysis negative for infection. Chest radiograph with significant hyperinflation withpositive lung markings bilaterally. Forearm x-rays with comminuted, impacted and displaced fracture of the distal radial metaphysis. Displaced fracture of the distal ulnar metaphysis. Cortical disruption at the level of the left acetabulum, raising concern about minimal displaced fracture.  Discharge Diagnoses:  Active Problems:   Left wrist fracture   Wrist fracture  1.Acute leftwrist fracture, displaced distal radial and ulnar metaphysis. Patient   Admitted for observation , continue paincontrol with acetaminophen .  Follow  orthopedic hand surgery consultation and physical therapy recommendations. For Inferior pubic rami fracture, orthopedics recommends weightbearing as tolerated, will require platform walker due to her left distal radius fracture. For  left distal radius fracture ,  Dr Marcelino Scot saw the patient and recommendations given.  Minimize narcotic medications as the patient was easily oversedated by  them. Use only  Tylenol   2.Hypertension Well controlled. Resume home meds.    3.Severe COPD with reactive leukocytosis. No acute exacerbation, continue bronchodilator with with desonide/formoterol, DuoNeb and tiotropium.Patient recently on systemic steroids,presented with leukocytosis on admission, no pneumonia.  Leukocytosis improving. Repeat chest x-rays shows chronic interstitial prominence, no pneumonia or CHF. Recommend checking cbc in one week to evaluate resolution of leukocytosis.   4.Depression. Continue mirtazapine  Discharge Instructions  Discharge Instructions    Diet - low sodium heart healthy   Complete by:  As directed    Discharge instructions   Complete by:  As directed    Please follow up with Dr Marcelino Scot as recommended.  Please follow up with PCP in one week.     Allergies as of 10/24/2018      Reactions   Sulfa Antibiotics Nausea Only      Medication List    STOP taking these medications   ipratropium 17 MCG/ACT inhaler Commonly known as:  ATROVENT HFA   predniSONE 20 MG tablet Commonly known as:  DELTASONE     TAKE these medications   acetaminophen 500 MG tablet Commonly known as:  TYLENOL Take 500 mg by mouth every 6 (six) hours as needed for mild pain.   amLODipine 5 MG tablet Commonly known as:  NORVASC Take 1 tablet (5 mg total) daily by mouth.   budesonide-formoterol 160-4.5 MCG/ACT inhaler Commonly known as:  SYMBICORT Inhale 2 puffs into the lungs 2 (two) times daily.   docusate sodium 100 MG capsule Commonly known as:  COLACE Take 1 capsule (100 mg total) by mouth 2 (two) times daily.   furosemide 20 MG tablet Commonly known as:  LASIX Take 20 mg by mouth daily.   ipratropium-albuterol 0.5-2.5 (3) MG/3ML Soln Commonly known as:  DUONEB Take 3 mLs by nebulization 3 (three) times daily. What changed:  when to take this  reasons to take this   mirtazapine 15 MG tablet Commonly known as:  REMERON Take 7.5 mg by mouth at  bedtime.   tiotropium 18 MCG inhalation capsule Commonly known as:  SPIRIVA Place 1 capsule (18 mcg total) into inhaler and inhale daily.       Allergies  Allergen Reactions  . Sulfa Antibiotics Nausea Only    Consultations:  Orthopedics Dr Marcelino Scot.   Procedures/Studies: Dg Chest 1 View  Result Date: 10/21/2018 CLINICAL DATA:  Status post fall. Concern for chest injury. Initial encounter. EXAM: CHEST  1 VIEW COMPARISON:  Chest radiograph performed 07/20/2018 FINDINGS: The lungs are hyperexpanded, with flattening of the hemidiaphragms, compatible with COPD. Chronic peribronchial thickening is noted. There is no evidence of focal opacification, pleural effusion or pneumothorax. The cardiomediastinal silhouette is within normal limits. No acute osseous abnormalities are seen. IMPRESSION: Findings of COPD. No acute cardiopulmonary process seen. No displaced rib fractures identified. Electronically Signed   By: Garald Balding M.D.   On: 10/21/2018 04:56   Dg Forearm Left  Result Date: 10/23/2018 CLINICAL DATA:  Distal radial fracture.  Patient splint. EXAM: LEFT FOREARM - 2 VIEW COMPARISON:  10/21/2018. FINDINGS: Patient splinted. Fractures of the distal radius and ulna again noted. These are better aligned than on prior exam. Some angulation and displacement still noted. Diffuse osteopenia. IMPRESSION: Patient splinted. Fractures of the distal radius and ulna again noted. These are better aligned than on prior exam. Some angulation and displacement still noted. Electronically Signed   By: Marcello Moores  Register   On: 10/23/2018 11:18   Dg Forearm Left  Result Date: 10/21/2018 CLINICAL DATA:  Status post fall, with left wrist pain. Initial encounter. EXAM: LEFT FOREARM - 2 VIEW COMPARISON:  None. FINDINGS: There is a comminuted, impacted and displaced fracture of the distal radial metaphysis, better characterized on concurrent wrist radiographs. There is also a displaced fracture of the distal  ulnar metaphysis. The proximal radius and ulna appear intact. No elbow joint effusion is seen. The carpal rows appear grossly intact. Alignment of the first carpometacarpal joint is not well assessed. IMPRESSION: 1. Comminuted, impacted and displaced fracture of the distal radial metaphysis, better characterized on concurrent wrist radiographs. 2. Displaced fracture of the distal ulnar metaphysis. 3. Alignment of the first carpometacarpal joint is not well assessed. Electronically Signed   By: Garald Balding M.D.   On: 10/21/2018 03:20   Dg Wrist 2 Views Left  Result Date: 10/23/2018 CLINICAL DATA:  Distal radial fracture.  Patient in a splint. EXAM: LEFT WRIST - 2 VIEW COMPARISON:  10/21/2018. FINDINGS: Patient is in a splint. Fractures of the distal radial and ulna are again noted. Better alignment noted than on prior exam. Some degree of displacement and posterior angulation noted. Diffuse osteopenia. IMPRESSION: The patient's pain. Distal radial and ulnar fractures are again noted. Better alignment noted than on prior exam. Some degree of displacement and posterior angulation noted. Electronically Signed   By: Marcello Moores  Register   On: 10/23/2018 11:19   Dg Wrist Complete Left  Result Date: 10/21/2018 CLINICAL DATA:  Evaluate for reduction EXAM: LEFT WRIST - COMPLETE 3+ VIEW COMPARISON:  10/21/2018 at 0246 hours FINDINGS: Comminuted intra-articular distal radial/radial styloid fracture. Fracture fragments are in near anatomic alignment and position following reduction. Mild widening of the radiocarpal articulation. Mildly comminuted fracture of the ulnar styloid and distal ulna, in near anatomic alignment and position. Overlying cast obscures fine osseous detail. IMPRESSION: Improved alignment status post reduction  of comminuted distal radial and ulnar fractures, as above. Electronically Signed   By: Julian Hy M.D.   On: 10/21/2018 07:30   Dg Wrist Complete Left  Result Date:  10/21/2018 CLINICAL DATA:  Status post fall, with left wrist pain, acute onset. Initial encounter. EXAM: LEFT WRIST - COMPLETE 3+ VIEW COMPARISON:  None. FINDINGS: There is a comminuted, impacted and significantly dorsally displaced fracture of the distal radial metaphysis, with associated shortening. The carpal rows articulate with the distal fragments. There is also a displaced fracture of the distal ulnar metaphysis. The carpal rows appear grossly intact. The first carpometacarpal joint is difficult to fully assess, though it appears in grossly normal alignment on the cross-table view. Soft tissue swelling is noted about the wrist. IMPRESSION: 1. Comminuted, impacted and significantly dorsally displaced fracture of the distal radial metaphysis, with associated shortening. 2. Displaced fracture of the distal ulnar metaphysis. Electronically Signed   By: Garald Balding M.D.   On: 10/21/2018 03:22   Dg Pelvis Comp Min 3v  Result Date: 10/21/2018 CLINICAL DATA:  Acetabular fracture. EXAM: JUDET PELVIS - 3+ VIEW COMPARISON:  Left hip x-rays from same day. FINDINGS: Unchanged minimally displaced fracture of the left puboacetabular junction. Minimally displaced fracture of the left inferior pubic ramus. The hip joint spaces are preserved. The pubic symphysis and sacroiliac joints are intact. Osteopenia. Soft tissues are unremarkable. IMPRESSION: 1. Minimally displaced fracture of the left puboacetabular junction without clear extension into the acetabulum. 2. Minimally displaced fracture of the left inferior pubic ramus. Electronically Signed   By: Titus Dubin M.D.   On: 10/21/2018 11:15   Dg Chest Port 1 View  Result Date: 10/23/2018 CLINICAL DATA:  Shortness of breath. History of emphysema, former smoker. EXAM: PORTABLE CHEST 1 VIEW COMPARISON:  Portable chest x-ray of October 21, 2018 FINDINGS: The lungs remain hyperinflated. The hemidiaphragms are flattened. The interstitial markings are coarse.  There is no pleural effusion or pneumothorax. The heart and pulmonary vascularity are normal. There is calcification in the wall of the thoracic aorta. The observed bony thorax is unremarkable. IMPRESSION: COPD. Chronic interstitial prominence. No pneumonia, CHF, nor other acute cardiopulmonary abnormality. Electronically Signed   By: David  Martinique M.D.   On: 10/23/2018 11:01   Dg Hip Unilat W Or Wo Pelvis 2-3 Views Left  Result Date: 10/21/2018 CLINICAL DATA:  Status post fall, with left hip pain. Initial encounter. EXAM: DG HIP (WITH OR WITHOUT PELVIS) 2-3V LEFT COMPARISON:  None. FINDINGS: Cortical disruption at the level of the left acetabulum raises concern for a minimally displaced fracture. Would correlate for associated symptoms. Both femoral heads are seated normally within their respective acetabula. The proximal left femur appears intact. No significant degenerative change is appreciated. The sacroiliac joints are unremarkable in appearance. The visualized bowel gas pattern is grossly unremarkable in appearance. Diffuse vascular calcifications are seen. IMPRESSION: 1. Cortical disruption at the level of the left acetabulum raises concern for a minimally displaced fracture. Would correlate for associated symptoms. 2. Diffuse vascular calcifications seen. Electronically Signed   By: Garald Balding M.D.   On: 10/21/2018 03:24       Subjective: No pain at this time, no chest pain or sob. No nausea or vomiting.   Discharge Exam: Vitals:   10/23/18 2056 10/24/18 0458  BP: (!) 122/51 124/60  Pulse: (!) 101 (!) 108  Resp: 20 12  Temp: 98.4 F (36.9 C) 98 F (36.7 C)  SpO2: 100% 98%   Vitals:   10/23/18  2015 10/23/18 2056 10/24/18 0458 10/24/18 0620  BP:  (!) 122/51 124/60   Pulse:  (!) 101 (!) 108   Resp:  20 12   Temp:  98.4 F (36.9 C) 98 F (36.7 C)   TempSrc:  Oral Oral   SpO2: 92% 100% 98%   Weight:    47 kg  Height:    5\' 5"  (1.651 m)    General: Pt is alert, awake,  not in acute distress Cardiovascular: RRR, S1/S2 +, no rubs, no gallops Respiratory: CTA bilaterally, no wheezing, no rhonchi Abdominal: Soft, NT, ND, bowel sounds + Extremities: no pedal edema.     The results of significant diagnostics from this hospitalization (including imaging, microbiology, ancillary and laboratory) are listed below for reference.     Microbiology: Recent Results (from the past 240 hour(s))  MRSA PCR Screening     Status: None   Collection Time: 10/21/18  5:30 PM  Result Value Ref Range Status   MRSA by PCR NEGATIVE NEGATIVE Final    Comment:        The GeneXpert MRSA Assay (FDA approved for NASAL specimens only), is one component of a comprehensive MRSA colonization surveillance program. It is not intended to diagnose MRSA infection nor to guide or monitor treatment for MRSA infections. Performed at [facility], [place] [address] [place], [place] [address]      Labs: BNP (last 3 results) Recent Labs    11/09/17 1511 06/04/18 1358 07/04/18 0014  BNP 10.4 24.4 59.9   Basic Metabolic Panel: Recent Labs  Lab 10/21/18 0400 10/22/18 0346 10/24/18 0312  NA 141 138 136  K 3.5 3.5 4.1  CL 95* 96* 93*  CO2 36* 38* 31  GLUCOSE 156* 137* 94  BUN 17 20 14   CREATININE 0.58 0.64 0.61  CALCIUM 9.0 8.3* 8.7*  MG 1.9  --   --    Liver Function Tests: Recent Labs  Lab 10/21/18 0400 10/24/18 0312  AST 18 16  ALT 30 16  ALKPHOS 76 68  BILITOT 0.5 1.9*  PROT 5.8* 5.5*  ALBUMIN 3.2* 2.6*   No results for input(s): LIPASE, AMYLASE in the last 168 hours. No results for input(s): AMMONIA in the last 168 hours. CBC: Recent Labs  Lab 10/21/18 0400 10/22/18 1042 10/23/18 0147 10/24/18 0312  WBC 30.8* 16.5* 17.2* 16.5*  NEUTROABS 28.4*  --   --  14.3*  HGB 12.7 10.7* 10.4* 10.4*  HCT 43.9 35.8* 36.4 36.5  MCV 100.7* 100.6* 101.1* 100.0  PLT 300 192 207 236   Cardiac Enzymes: No results for input(s): CKTOTAL, CKMB, CKMBINDEX,  TROPONINI in the last 168 hours. BNP: Invalid input(s): POCBNP CBG: No results for input(s): GLUCAP in the last 168 hours. D-Dimer No results for input(s): DDIMER in the last 72 hours. Hgb A1c No results for input(s): HGBA1C in the last 72 hours. Lipid Profile No results for input(s): CHOL, HDL, LDLCALC, TRIG, CHOLHDL, LDLDIRECT in the last 72 hours. Thyroid function studies No results for input(s): TSH, T4TOTAL, T3FREE, THYROIDAB in the last 72 hours.  Invalid input(s): FREET3 Anemia work up No results for input(s): VITAMINB12, FOLATE, FERRITIN, TIBC, IRON, RETICCTPCT in the last 72 hours. Urinalysis    Component Value Date/Time   COLORURINE YELLOW 10/21/2018 [place] 10/21/2018 0647   LABSPEC 1.017 10/21/2018 [place] 6.0 10/21/2018 [place] 10/21/2018 [place] 10/21/2018 [place] NEGATIVE 10/21/2018 3570  [place] NEGATIVE 10/21/2018 [place] 10/21/2018 0647   NITRITE NEGATIVE 10/21/2018 0647   LEUKOCYTESUR NEGATIVE 10/21/2018 0647   Sepsis Labs Invalid input(s): PROCALCITONIN,  WBC,  LACTICIDVEN Microbiology Recent Results (from the past 240 hour(s))  MRSA PCR Screening     Status: None   Collection Time: 10/21/18  5:30 PM  Result Value Ref Range Status   MRSA by PCR NEGATIVE NEGATIVE Final    Comment:        The GeneXpert MRSA Assay (FDA approved for NASAL specimens only), is one component of a comprehensive MRSA colonization surveillance program. It is not intended to diagnose MRSA infection nor to guide or monitor treatment for MRSA infections. Performed at [facility], [place] [address], [place], [place] [address]      Time coordinating discharge: 35 minutes  SIGNED:   Hosie Poisson, MD  [facility] 10/24/2018, 9:02 AM Pager   If 7PM-7AM, please contact night-coverage [email or web address] Password [email or web address]

## 2018-10-24 NOTE — NC FL2 (Signed)
[place] MEDICAID FL2 LEVEL OF CARE SCREENING TOOL     IDENTIFICATION  Patient Name: Sheila Mcconnell Birthdate: Feb 17, 1927 Sex: female Admission Date (Current Location): 10/21/2018  [facility] and [place] Number:  Herbalist and Address:  [facility] [place], [place] [address] [place], [place] [address]      Provider Number: [id number]  Attending Physician Name and Address:  Hosie Poisson, MD  Relative Name and Phone Number:  [name] [phone]    Current Level of Care: Hospital Recommended Level of Care: [place] Prior Approval Number:    Date Approved/Denied:   PASRR Number: [id number]  Discharge Plan: SNF    Current Diagnoses: Patient Active Problem List   Diagnosis Date Noted  . Left wrist fracture 10/21/2018  . Wrist fracture 10/21/2018  . Malnutrition of moderate degree 07/05/2018  . Goals of care, counseling/discussion   . Palliative care encounter   . Emphysema of lung ([place]) 07/04/2018  . Dyspnea on exertion 07/04/2018  . Chronic hypercapnic respiratory failure ([place]) 07/04/2018  . Emphysema lung ([place]) 06/27/2017  . Oxygen dependent 06/27/2017  . Hypertension 06/27/2017    Orientation RESPIRATION BLADDER Height & Weight     Self, Place  O2(nasal cannula 2L/min) Continent Weight: 103 lb 9.9 oz (47 kg) Height:  5\' 5"  (165.1 cm)  BEHAVIORAL SYMPTOMS/MOOD NEUROLOGICAL BOWEL NUTRITION STATUS      Continent    AMBULATORY STATUS COMMUNICATION OF NEEDS Skin   Extensive Assist Verbally Skin abrasions, Other (Comment)(abrasion right and left knee, ecchymosis right and left bilateral face, leg, arm and hip. skin tear hand and wrists. left wrist open/dehisced wound/incision)                       Personal Care Assistance Level of Assistance  Bathing, Feeding, Dressing, Total care Bathing Assistance: Maximum assistance Feeding assistance: Independent(minimal/refusing at this time) Dressing Assistance: Maximum  assistance Total Care Assistance: Maximum assistance   Functional Limitations Info  Sight, Hearing, Speech Sight Info: Adequate Hearing Info: Impaired Speech Info: Adequate    SPECIAL CARE FACTORS FREQUENCY  PT (By licensed PT), OT (By licensed OT)     PT Frequency: min 5x weekly OT Frequency: min 3x weekly            Contractures Contractures Info: Not present    Additional Factors Info  Code Status, Allergies Code Status Info: DNR Allergies Info: Allergies:  Sulfa Antibiotics           Current Medications (10/24/2018):  This is the current hospital active medication list Current Facility-Administered Medications  Medication Dose Route Frequency Provider Last Rate Last Dose  . 0.9 %  sodium chloride infusion  250 mL Intravenous PRN Arrien, Jimmy Picket, MD      . acetaminophen (TYLENOL) tablet 500 mg  500 mg Oral Q6H Arrien, Jimmy Picket, MD   500 mg at 10/24/18 1155  . docusate sodium (COLACE) capsule 100 mg  100 mg Oral BID Schorr, Rhetta Mura, NP   100 mg at 10/24/18 1014  . enoxaparin (LOVENOX) injection 40 mg  40 mg Subcutaneous Daily Arrien, Jimmy Picket, MD   40 mg at 10/24/18 1014  . ipratropium-albuterol (DUONEB) 0.5-2.5 (3) MG/3ML nebulizer solution 3 mL  3 mL Nebulization TID PRN Arrien, Jimmy Picket, MD   3 mL at 10/21/18 1706  . mirtazapine (REMERON) tablet 7.5 mg  7.5 mg Oral QHS Arrien, Jimmy Picket, MD   7.5 mg at 10/23/18 2142  . mometasone-formoterol (DULERA)  200-5 MCG/ACT inhaler 2 puff  2 puff Inhalation BID Arrien, Jimmy Picket, MD   2 puff at 10/24/18 0906  . naloxone [facility]) injection 0.4 mg  0.4 mg Intravenous PRN Reyne Dumas, MD   0.4 mg at 10/23/18 0850  . ondansetron (ZOFRAN) tablet 4 mg  4 mg Oral Q6H PRN Arrien, Jimmy Picket, MD       Or  . ondansetron [facility]) injection 4 mg  4 mg Intravenous Q6H PRN Arrien, Jimmy Picket, MD      . sodium chloride flush (NS) 0.9 % injection 3 mL  3 mL Intravenous Q12H Arrien,  Jimmy Picket, MD   3 mL at 10/24/18 1026  . sodium chloride flush (NS) 0.9 % injection 3 mL  3 mL Intravenous PRN Arrien, Jimmy Picket, MD         Discharge Medications: Please see discharge summary for a list of discharge medications.  Relevant Imaging Results:  Relevant Lab Results:   Additional Information SSN:  [id number]  Alberteen Sam, LCSW

## 2018-10-24 NOTE — Progress Notes (Signed)
Physical Therapy Treatment Patient Details Name: Sheila Mcconnell MRN: [id number] DOB: 28-Jun-1927 Today's Date: 10/24/2018    History of Present Illness Pt is 84 y.o. female presenting with left wrist fracture and left superior and inferior pubic rami fracture secondary to fall at ALF. PMH significant for medical history significant of severe COPD, chronic hypoxic respiratory failure, GERD, hypertension, dyslipidemia, dependence on oxygen, HOH, and osteoporosis.    PT Comments    Pt presenting with improved cognitive status today and awareness of deficits. Pt completed stand pivot transfer to [facility] with [place] for powerup, stability, and control with sitting down on BSC. Initiated gait training with use of left platform walker today. Pt requires ModAx2 for power-up into standing, stability during gait, and management of platform walker. Mod cuing for sequencing and UE use with AD. Able to progress to Mod-MaxA of 1 person with 2+ for chair follow as pt fatigues quickly with complaint of nausea. PT continuing to recommend SNF at this time, as pt requires additional assistance for safety with ADLs. PT will continue to follow acutely.   Follow Up Recommendations  SNF;Supervision/Assistance - 24 hour     Equipment Recommendations  Other (comment)(tbd at next venue)       Precautions / Restrictions Precautions Precautions: Fall Required Braces or Orthoses: Other Brace/Splint Other Brace/Splint: left wrist splint Restrictions Weight Bearing Restrictions: Yes LUE Weight Bearing: Non weight bearing LLE Weight Bearing: Weight bearing as tolerated Other Position/Activity Restrictions: awaiting left wrist consult for weight bearing status    Mobility     Transfers Overall transfer level: Needs assistance Equipment used: 1 person hand held assist;Left platform walker Transfers: Stand Pivot Transfers;Sit to/from Stand Sit to Stand: Mod assist;Max assist;+2 physical assistance;+2  safety/equipment Stand pivot transfers: Mod assist       General transfer comment: Pt completing stand pivot transfer hand held assist with [place] provided for stability and shuffling over to transfer to bedside commode. Mod-MaxAx2 for management of platform walker and power-up into stance when performing sit to stand from [facility].   Ambulation/Gait Ambulation/Gait assistance: Mod assist;Max assist;+2 safety/equipment Gait Distance (Feet): 3 Feet Assistive device: Left platform walker Gait Pattern/deviations: Shuffle;Decreased stride length;Antalgic   Gait velocity interpretation: <1.31 ft/sec, indicative of household ambulator General Gait Details: Pt Mod-MaxAx2 for intial steps, progressing to Mod-MaxA with 2+ for chair follow. Assistance needed for stability in stance, as pt has decreased weight shift to LLE and exhibited one moment of LOB posteriorly during single leg stance phase on LLE. Pt limited by nausea and fatigue, which improved with sitting. Cuing needed for proper use of platform walker, hand placement, and sequencing.      Balance Overall balance assessment: Needs assistance Sitting-balance support: Single extremity supported;Feet supported Sitting balance-Leahy Scale: Fair Sitting balance - Comments: pt able to weight shift to accomplish self-care on BSC.   Standing balance support: Bilateral upper extremity supported Standing balance-Leahy Scale: Poor                              Cognition Arousal/Alertness: Awake/alert Behavior During Therapy: WFL for tasks assessed/performed Overall Cognitive Status: Impaired/Different from baseline Area of Impairment: Safety/judgement;Following commands;Awareness                       Following Commands: Follows one step commands with increased time Safety/Judgement: Decreased awareness of safety     General Comments: Pt is more oriented today, aware of her deficits, thought it  was Tuesday instead of Wednesday.  Pt is more responsive and less confused, able to follow commands with increased time.       Exercises  Pt unable to perform seated marches with left hip, limited by pain.     General Comments General comments (skin integrity, edema, etc.): Pt with improved cognition today, completing therapy session on 3 liters of supplemental oxygen via nasal cannula. O2 was 90% immediately following ambulation, increased to 98% within a couple minutes of seated rest. HR was 138 bpm following ambulation, returned to 122 bpm after 2 minutes of sitting. Discussed need for additional rehab with patient given limited assistance at ALF, pt was agreeable. Fingers of left wrist remain purple with edema.       Pertinent Vitals/Pain Pain Assessment: Faces Faces Pain Scale: Hurts a little bit Pain Location: left wrist, left pelvis Pain Descriptors / Indicators: Grimacing;Discomfort;Tender Pain Intervention(s): Limited activity within patient's tolerance;Monitored during session           PT Goals (current goals can now be found in the care plan section) Acute Rehab PT Goals Patient Stated Goal: "go home" (ALF) PT Goal Formulation: With patient Time For Goal Achievement: 11/05/18 Potential to Achieve Goals: Fair Progress towards PT goals: Progressing toward goals    Frequency    Min 3X/week      PT Plan Current plan remains appropriate       AM-PAC PT "6 Clicks" Daily Activity  Outcome Measure  Difficulty turning over in bed (including adjusting bedclothes, sheets and blankets)?: Unable Difficulty moving from lying on back to sitting on the side of the bed? : Unable Difficulty sitting down on and standing up from a chair with arms (e.g., wheelchair, bedside commode, etc,.)?: Unable Help needed moving to and from a bed to chair (including a wheelchair)?: Total Help needed walking in hospital room?: Total Help needed climbing 3-5 steps with a railing? : Total 6 Click Score: 6    End of Session  Equipment Utilized During Treatment: Gait belt;Oxygen Activity Tolerance: Patient tolerated treatment well Patient left: in chair;with call bell/phone within reach;with chair alarm set Nurse Communication: Mobility status PT Visit Diagnosis: Unsteadiness on feet (R26.81);History of falling (Z91.81);Muscle weakness (generalized) (M62.81);Other abnormalities of gait and mobility (R26.89);Pain Pain - Right/Left: Left Pain - part of body: Arm;Hand;Leg     Time: 4235-3614 PT Time Calculation (min) (ACUTE ONLY): 25 min  Charges:  $Gait Training: 8-22 mins $Therapeutic Activity: 8-22 mins                     Vernell Morgans, SPT Acute Rehabilitation Services Office [phone]    Vernell Morgans 10/24/2018, 2:31 PM

## 2018-10-24 NOTE — Progress Notes (Signed)
Orthopaedic Trauma Service (OTS)      Subjective: Patient reports pain as improving. Eagerly awaiting PT and OT!  Objective: Current Vitals Blood pressure 124/60, pulse (!) 108, temperature 98 F (36.7 C), temperature source Oral, resp. rate 12, height 5\' 5"  (1.651 m), weight 47 kg, SpO2 98 %. Vital signs in last 24 hours: Temp:  [98 F (36.7 C)-98.4 F (36.9 C)] 98 F (36.7 C) (11/20 0458) Pulse Rate:  [101-108] 108 (11/20 0458) Resp:  [12-20] 12 (11/20 0458) BP: (122-124)/(51-60) 124/60 (11/20 0458) SpO2:  [92 %-100 %] 98 % (11/20 0458) Weight:  [47 kg] 47 kg (11/20 0620)  Intake/Output from previous day: No intake/output data recorded.  LABS Recent Labs    10/22/18 1042 10/23/18 0147 10/24/18 0312  HGB 10.7* 10.4* 10.4*   Recent Labs    10/23/18 0147 10/24/18 0312  WBC 17.2* 16.5*  RBC 3.60* 3.65*  HCT 36.4 36.5  PLT 207 236   Recent Labs    10/22/18 0346 10/24/18 0312  NA 138 136  K 3.5 4.1  CL 96* 93*  CO2 38* 31  BUN 20 14  CREATININE 0.64 0.61  GLUCOSE 137* 94  CALCIUM 8.3* 8.7*   No results for input(s): LABPT, INR in the last 72 hours.   Physical Exam LUE Splint in place, fingers quite swollen  Sens  Ax/R/M/U intact  Mot   Ax/ R/ PIN/ M/ AIN/ U intact  Brisk CR R&LLE    Edema/ swelling controlled  Sens: DPN, SPN, TN intact  Motor: EHL, FHL, and lessor toe ext and flex all intact grossly  Brisk cap refill, warm to touch  Assessment/Plan:    1. PT/OT WBAT thru left elbow and bilat LE 2. DVT proph Lovenox 3. F/u within one week of discharge for check of left wrist and possible splint/ cast change  Altamese Marquand, MD [facility] [phone] (p)

## 2018-10-24 NOTE — Care Management Note (Signed)
Case Management Note  Patient Details  Name: Sheila Mcconnell MRN: [id number] Date of Birth: [birth year]  Subjective/Objective:    84 yr old female placed in observation with left wrist fracture and left pubic rami fracture, non operative.                Action/Plan:   Expected Discharge Date:  10/24/18               Expected Discharge Plan:  Assisted Living / Rest Home  In-House Referral:  NA, Clinical Social Work  Discharge planning Services  CM Consult  Post Acute Care Choice:  Home Health Choice offered to:  Patient, Adult Children  DME Arranged:  N/A DME Agency:  NA  HH Arranged:  PT, OT, Nurse's Aide [place] Agency:  [facility]  Status of Service:  In process, will continue to follow  If discussed at Long Length of Stay Meetings, dates discussed:    Additional Comments:  Ninfa Meeker, RN 10/24/2018, 12:01 PM

## 2018-10-24 NOTE — Clinical Social Work Note (Signed)
Clinical Social Work Assessment  Patient Details  Name: Sheila Mcconnell MRN: [id number] Date of Birth: 03-Mar-1927  Date of referral:  10/24/18               Reason for consult:  Discharge Planning                Permission sought to share information with:  Case Manager, Facility Sport and exercise psychologist, Family Supports Permission granted to share information::  Yes, Verbal Permission Granted  Name::     Sheila Mcconnell  Agency::  SNFs  Relationship::  daughter  Contact Information:  [phone]  Housing/Transportation Living arrangements for the past 2 months:  Assisted Living Facility([facility]) Source of Information:  Patient Patient Interpreter Needed:  None Criminal Activity/Legal Involvement Pertinent to Current Situation/Hospitalization:  No - Comment as needed Significant Relationships:  Adult Children Lives with:  Facility Resident Do you feel safe going back to the place where you live?  No Need for family participation in patient care:  Yes (Comment)  Care giving concerns:  CSW received referral for possible SNF placement at time of discharge. Spoke with patient regarding possibility of SNF placement . Patient's ALF at [facility]   is currently unable to meet patient's mobility deficit needs at this time.  Patient and her daughter Sheila Mcconnell at bedside expressed understanding of PT recommendation and are agreeable to SNF placement at time of discharge. CSW to continue to follow and assist with discharge planning needs.     Social Worker assessment / plan:  Spoke with patient and  Daughter Sheila Mcconnell concerning possibility of rehab at [facility] before returning home.    Employment status:  Retired Nurse, adult PT Recommendations:  [employer] / Referral to community resources:  [place]  Patient/Family's Response to care:  Patient and  Daughter Sheila Mcconnell   recognize need for rehab before returning home and are agreeable to a SNF in  [place]. They report preference for   [facility] however they are unable to accomodate patient's insurance on Skilled Nursing side. Insurance suggested [facility], [facility], or [facility] that are in [place]. Patient's daughter Sheila Mcconnell is following up on choices. CSW explained insurance authorization process. Patient's family reported that they want patient to get stronger to be able to come back home.    Patient/Family's Understanding of and Emotional Response to Diagnosis, Current Treatment, and Prognosis:  Patient/family is realistic regarding therapy needs and expressed being hopeful for SNF placement. Patient expressed understanding of CSW role and discharge process as well as medical condition. No questions/concerns about plan or treatment.    Emotional Assessment Appearance:  Appears stated age Attitude/Demeanor/Rapport:  Gracious Affect (typically observed):  Accepting, Adaptable Orientation:  Oriented to Self, Oriented to Place, Oriented to Situation Alcohol / Substance use:  Not Applicable Psych involvement (Current and /or in the community):  No (Comment)  Discharge Needs  Concerns to be addressed:  Discharge Planning Concerns Readmission within the last 30 days:  No Current discharge risk:  Dependent with Mobility Barriers to Discharge:  Continued Medical Work up   [employer], LCSW 10/24/2018, 12:46 PM

## 2018-10-25 DIAGNOSIS — S52502A Unspecified fracture of the lower end of left radius, initial encounter for closed fracture: Secondary | ICD-10-CM | POA: Diagnosis not present

## 2018-10-25 DIAGNOSIS — S52552A Other extraarticular fracture of lower end of left radius, initial encounter for closed fracture: Secondary | ICD-10-CM | POA: Diagnosis not present

## 2018-10-25 LAB — CBC WITH DIFFERENTIAL/PLATELET
Abs Immature Granulocytes: 0.07 10*3/uL (ref 0.00–0.07)
Basophils Absolute: 0 10*3/uL (ref 0.0–0.1)
Basophils Relative: 0 %
Eosinophils Absolute: 0.2 10*3/uL (ref 0.0–0.5)
Eosinophils Relative: 2 %
HCT: 33.4 % — ABNORMAL LOW (ref 36.0–46.0)
Hemoglobin: 9.9 g/dL — ABNORMAL LOW (ref 12.0–15.0)
Immature Granulocytes: 1 %
LYMPHS PCT: 5 %
Lymphs Abs: 0.7 10*3/uL (ref 0.7–4.0)
MCH: 28.8 pg (ref 26.0–34.0)
MCHC: 29.6 g/dL — AB (ref 30.0–36.0)
MCV: 97.1 fL (ref 80.0–100.0)
MONO ABS: 1.3 10*3/uL — AB (ref 0.1–1.0)
Monocytes Relative: 10 %
NEUTROS ABS: 10.8 10*3/uL — AB (ref 1.7–7.7)
Neutrophils Relative %: 82 %
PLATELETS: 271 10*3/uL (ref 150–400)
RBC: 3.44 MIL/uL — AB (ref 3.87–5.11)
RDW: 13.5 % (ref 11.5–15.5)
WBC: 13.1 10*3/uL — ABNORMAL HIGH (ref 4.0–10.5)
nRBC: 0 % (ref 0.0–0.2)

## 2018-10-25 NOTE — Discharge Summary (Signed)
Physician Discharge Summary  Sheila Mcconnell IWP:[id number] DOB: 03-03-1927 DOA: 10/21/2018  PCP: Ma Hillock, DO  Admit date: 10/21/2018 Discharge date: 10/25/2018  Admitted From: ALF Disposition: SNF when bed avaiable.   Recommendations for Outpatient Follow-up:  1. Follow up with PCP in 1-2 weeks 2. Please obtain BMP/CBC in one week 3. F/u within one week of discharge for check of left wrist and possible splint/ cast change by Dr handy.      Discharge Condition:STABLE.  CODE STATUS: DNR Diet recommendation: Heart Healthy  Brief/Interim Summary: 84 y.o.femalewith medical history significant ofsevere COPD, chronic hypoxic respiratory failure, GERD, hypertension, dyslipidemia and osteoporosis. Patient admitted on 11/17 after a fall. Found to have left superior and inferior pubic rami fracture, left distal radius fracture, seen by trauma surgery, orthopedics. Urine analysis negative for infection. Chest radiograph with significant hyperinflation withpositive lung markings bilaterally. Forearm x-rays with comminuted, impacted and displaced fracture of the distal radial metaphysis. Displaced fracture of the distal ulnar metaphysis. Cortical disruption at the level of the left acetabulum, raising concern about minimal displaced fracture.  Discharge Diagnoses:  Active Problems:   Left wrist fracture   Wrist fracture  1.Acute leftwrist fracture, displaced distal radial and ulnar metaphysis. Patient   Admitted for observation , continue paincontrol with acetaminophen .  Follow  orthopedic hand surgery consultation and physical therapy recommendations. For Inferior pubic rami fracture, orthopedics recommends weightbearing as tolerated, will require platform walker due to her left distal radius fracture. For  left distal radius fracture ,  Dr Marcelino Scot saw the patient and recommendations given.  Minimize narcotic medications as the patient was easily oversedated by  them. Use only  Tylenol   2.Hypertension Well controlled. Resume home meds.    3.Severe COPD with reactive leukocytosis. No acute exacerbation, continue bronchodilator with with desonide/formoterol, DuoNeb and tiotropium.Patient recently on systemic steroids,presented with leukocytosis on admission, no pneumonia.  Leukocytosis improving. Repeat chest x-rays shows chronic interstitial prominence, no pneumonia or CHF. Recommend checking cbc in one week to evaluate resolution of leukocytosis.   4.Depression. Continue mirtazapine  Discharge Instructions  Discharge Instructions    Diet - low sodium heart healthy   Complete by:  As directed    Discharge instructions   Complete by:  As directed    Please follow up with Dr Marcelino Scot as recommended.  Please follow up with PCP in one week.     Allergies as of 10/25/2018      Reactions   Sulfa Antibiotics Nausea Only      Medication List    STOP taking these medications   ipratropium 17 MCG/ACT inhaler Commonly known as:  ATROVENT HFA   predniSONE 20 MG tablet Commonly known as:  DELTASONE     TAKE these medications   acetaminophen 500 MG tablet Commonly known as:  TYLENOL Take 500 mg by mouth every 6 (six) hours as needed for mild pain.   amLODipine 5 MG tablet Commonly known as:  NORVASC Take 1 tablet (5 mg total) daily by mouth.   budesonide-formoterol 160-4.5 MCG/ACT inhaler Commonly known as:  SYMBICORT Inhale 2 puffs into the lungs 2 (two) times daily.   docusate sodium 100 MG capsule Commonly known as:  COLACE Take 1 capsule (100 mg total) by mouth 2 (two) times daily.   furosemide 20 MG tablet Commonly known as:  LASIX Take 20 mg by mouth daily.   ipratropium-albuterol 0.5-2.5 (3) MG/3ML Soln Commonly known as:  DUONEB Take 3 mLs by nebulization 3 (three) times daily. What  changed:    when to take this  reasons to take this   mirtazapine 15 MG tablet Commonly known as:  REMERON Take 7.5 mg by mouth at  bedtime.   tiotropium 18 MCG inhalation capsule Commonly known as:  SPIRIVA Place 1 capsule (18 mcg total) into inhaler and inhale daily.      Contact information for after-discharge care    [facility] .   Service:  Skilled Nursing Contact information: [place] [address] [phone]             Allergies  Allergen Reactions  . Sulfa Antibiotics Nausea Only    Consultations:  Orthopedics Dr Marcelino Scot.   Procedures/Studies: Dg Chest 1 View  Result Date: 10/21/2018 CLINICAL DATA:  Status post fall. Concern for chest injury. Initial encounter. EXAM: CHEST  1 VIEW COMPARISON:  Chest radiograph performed 07/20/2018 FINDINGS: The lungs are hyperexpanded, with flattening of the hemidiaphragms, compatible with COPD. Chronic peribronchial thickening is noted. There is no evidence of focal opacification, pleural effusion or pneumothorax. The cardiomediastinal silhouette is within normal limits. No acute osseous abnormalities are seen. IMPRESSION: Findings of COPD. No acute cardiopulmonary process seen. No displaced rib fractures identified. Electronically Signed   By: Garald Balding M.D.   On: 10/21/2018 04:56   Dg Forearm Left  Result Date: 10/23/2018 CLINICAL DATA:  Distal radial fracture.  Patient splint. EXAM: LEFT FOREARM - 2 VIEW COMPARISON:  10/21/2018. FINDINGS: Patient splinted. Fractures of the distal radius and ulna again noted. These are better aligned than on prior exam. Some angulation and displacement still noted. Diffuse osteopenia. IMPRESSION: Patient splinted. Fractures of the distal radius and ulna again noted. These are better aligned than on prior exam. Some angulation and displacement still noted. Electronically Signed   By: Marcello Moores  Register   On: 10/23/2018 11:18   Dg Forearm Left  Result Date: 10/21/2018 CLINICAL DATA:  Status post fall, with left wrist pain. Initial encounter. EXAM: LEFT FOREARM - 2  VIEW COMPARISON:  None. FINDINGS: There is a comminuted, impacted and displaced fracture of the distal radial metaphysis, better characterized on concurrent wrist radiographs. There is also a displaced fracture of the distal ulnar metaphysis. The proximal radius and ulna appear intact. No elbow joint effusion is seen. The carpal rows appear grossly intact. Alignment of the first carpometacarpal joint is not well assessed. IMPRESSION: 1. Comminuted, impacted and displaced fracture of the distal radial metaphysis, better characterized on concurrent wrist radiographs. 2. Displaced fracture of the distal ulnar metaphysis. 3. Alignment of the first carpometacarpal joint is not well assessed. Electronically Signed   By: Garald Balding M.D.   On: 10/21/2018 03:20   Dg Wrist 2 Views Left  Result Date: 10/23/2018 CLINICAL DATA:  Distal radial fracture.  Patient in a splint. EXAM: LEFT WRIST - 2 VIEW COMPARISON:  10/21/2018. FINDINGS: Patient is in a splint. Fractures of the distal radial and ulna are again noted. Better alignment noted than on prior exam. Some degree of displacement and posterior angulation noted. Diffuse osteopenia. IMPRESSION: The patient's pain. Distal radial and ulnar fractures are again noted. Better alignment noted than on prior exam. Some degree of displacement and posterior angulation noted. Electronically Signed   By: Marcello Moores  Register   On: 10/23/2018 11:19   Dg Wrist Complete Left  Result Date: 10/21/2018 CLINICAL DATA:  Evaluate for reduction EXAM: LEFT WRIST - COMPLETE 3+ VIEW COMPARISON:  10/21/2018 at 0246 hours FINDINGS: Comminuted intra-articular distal  radial/radial styloid fracture. Fracture fragments are in near anatomic alignment and position following reduction. Mild widening of the radiocarpal articulation. Mildly comminuted fracture of the ulnar styloid and distal ulna, in near anatomic alignment and position. Overlying cast obscures fine osseous detail. IMPRESSION: Improved  alignment status post reduction of comminuted distal radial and ulnar fractures, as above. Electronically Signed   By: Julian Hy M.D.   On: 10/21/2018 07:30   Dg Wrist Complete Left  Result Date: 10/21/2018 CLINICAL DATA:  Status post fall, with left wrist pain, acute onset. Initial encounter. EXAM: LEFT WRIST - COMPLETE 3+ VIEW COMPARISON:  None. FINDINGS: There is a comminuted, impacted and significantly dorsally displaced fracture of the distal radial metaphysis, with associated shortening. The carpal rows articulate with the distal fragments. There is also a displaced fracture of the distal ulnar metaphysis. The carpal rows appear grossly intact. The first carpometacarpal joint is difficult to fully assess, though it appears in grossly normal alignment on the cross-table view. Soft tissue swelling is noted about the wrist. IMPRESSION: 1. Comminuted, impacted and significantly dorsally displaced fracture of the distal radial metaphysis, with associated shortening. 2. Displaced fracture of the distal ulnar metaphysis. Electronically Signed   By: Garald Balding M.D.   On: 10/21/2018 03:22   Dg Pelvis Comp Min 3v  Result Date: 10/21/2018 CLINICAL DATA:  Acetabular fracture. EXAM: JUDET PELVIS - 3+ VIEW COMPARISON:  Left hip x-rays from same day. FINDINGS: Unchanged minimally displaced fracture of the left puboacetabular junction. Minimally displaced fracture of the left inferior pubic ramus. The hip joint spaces are preserved. The pubic symphysis and sacroiliac joints are intact. Osteopenia. Soft tissues are unremarkable. IMPRESSION: 1. Minimally displaced fracture of the left puboacetabular junction without clear extension into the acetabulum. 2. Minimally displaced fracture of the left inferior pubic ramus. Electronically Signed   By: Titus Dubin M.D.   On: 10/21/2018 11:15   Dg Chest Port 1 View  Result Date: 10/23/2018 CLINICAL DATA:  Shortness of breath. History of emphysema, former  smoker. EXAM: PORTABLE CHEST 1 VIEW COMPARISON:  Portable chest x-ray of October 21, 2018 FINDINGS: The lungs remain hyperinflated. The hemidiaphragms are flattened. The interstitial markings are coarse. There is no pleural effusion or pneumothorax. The heart and pulmonary vascularity are normal. There is calcification in the wall of the thoracic aorta. The observed bony thorax is unremarkable. IMPRESSION: COPD. Chronic interstitial prominence. No pneumonia, CHF, nor other acute cardiopulmonary abnormality. Electronically Signed   By: David  Martinique M.D.   On: 10/23/2018 11:01   Dg Hip Unilat W Or Wo Pelvis 2-3 Views Left  Result Date: 10/21/2018 CLINICAL DATA:  Status post fall, with left hip pain. Initial encounter. EXAM: DG HIP (WITH OR WITHOUT PELVIS) 2-3V LEFT COMPARISON:  None. FINDINGS: Cortical disruption at the level of the left acetabulum raises concern for a minimally displaced fracture. Would correlate for associated symptoms. Both femoral heads are seated normally within their respective acetabula. The proximal left femur appears intact. No significant degenerative change is appreciated. The sacroiliac joints are unremarkable in appearance. The visualized bowel gas pattern is grossly unremarkable in appearance. Diffuse vascular calcifications are seen. IMPRESSION: 1. Cortical disruption at the level of the left acetabulum raises concern for a minimally displaced fracture. Would correlate for associated symptoms. 2. Diffuse vascular calcifications seen. Electronically Signed   By: Garald Balding M.D.   On: 10/21/2018 03:24      Subjective: No pain at this time, no chest pain or sob. No nausea or  vomiting.   Discharge Exam: Vitals:   10/25/18 0755 10/25/18 1157  BP:  139/67  Pulse:  (!) 112  Resp:  19  Temp:  98 F (36.7 C)  SpO2: 95% 99%   Vitals:   10/24/18 2021 10/25/18 0511 10/25/18 0755 10/25/18 1157  BP: (!) 158/92 135/67  139/67  Pulse: (!) 120 (!) 111  (!) 112  Resp: 16  18  19   Temp: 97.9 F (36.6 C) 98.2 F (36.8 C)  98 F (36.7 C)  TempSrc: Oral Oral  Oral  SpO2: 96% 98% 95% 99%  Weight:      Height:        General: Pt is alert, awake, not in acute distress Cardiovascular: RRR, S1/S2 +, no rubs, no gallops Respiratory: CTA bilaterally, no wheezing, no rhonchi Abdominal: Soft, NT, ND, bowel sounds + Extremities: no pedal edema.     The results of significant diagnostics from this hospitalization (including imaging, microbiology, ancillary and laboratory) are listed below for reference.     Microbiology: Recent Results (from the past 240 hour(s))  MRSA PCR Screening     Status: None   Collection Time: 10/21/18  5:30 PM  Result Value Ref Range Status   MRSA by PCR NEGATIVE NEGATIVE Final    Comment:        The GeneXpert MRSA Assay (FDA approved for NASAL specimens only), is one component of a comprehensive MRSA colonization surveillance program. It is not intended to diagnose MRSA infection nor to guide or monitor treatment for MRSA infections. Performed at [facility], [place] [address] [place],  [address]      Labs: BNP (last 3 results) Recent Labs    11/09/17 1511 06/04/18 1358 07/04/18 0014  BNP 10.4 24.4 99.3   Basic Metabolic Panel: Recent Labs  Lab 10/21/18 0400 10/22/18 0346 10/24/18 0312  NA 141 138 136  K 3.5 3.5 4.1  CL 95* 96* 93*  CO2 36* 38* 31  GLUCOSE 156* 137* 94  BUN 17 20 14   CREATININE 0.58 0.64 0.61  CALCIUM 9.0 8.3* 8.7*  MG 1.9  --   --    Liver Function Tests: Recent Labs  Lab 10/21/18 0400 10/24/18 0312  AST 18 16  ALT 30 16  ALKPHOS 76 68  BILITOT 0.5 1.9*  PROT 5.8* 5.5*  ALBUMIN 3.2* 2.6*   No results for input(s): LIPASE, AMYLASE in the last 168 hours. No results for input(s): AMMONIA in the last 168 hours. CBC: Recent Labs  Lab 10/21/18 0400 10/22/18 1042 10/23/18 0147 10/24/18 0312 10/25/18 0209  WBC 30.8* 16.5* 17.2* 16.5* 13.1*  NEUTROABS 28.4*  --    --  14.3* 10.8*  HGB 12.7 10.7* 10.4* 10.4* 9.9*  HCT 43.9 35.8* 36.4 36.5 33.4*  MCV 100.7* 100.6* 101.1* 100.0 97.1  PLT 300 192 207 236 271   Cardiac Enzymes: No results for input(s): CKTOTAL, CKMB, CKMBINDEX, TROPONINI in the last 168 hours. BNP: Invalid input(s): POCBNP CBG: No results for input(s): GLUCAP in the last 168 hours. D-Dimer No results for input(s): DDIMER in the last 72 hours. Hgb A1c No results for input(s): HGBA1C in the last 72 hours. Lipid Profile No results for input(s): CHOL, HDL, LDLCALC, TRIG, CHOLHDL, LDLDIRECT in the last 72 hours. Thyroid function studies No results for input(s): TSH, T4TOTAL, T3FREE, THYROIDAB in the last 72 hours.  Invalid input(s): FREET3 Anemia work up No results for input(s): VITAMINB12, FOLATE, FERRITIN, TIBC, IRON, RETICCTPCT in the last 72 hours. Urinalysis  Component Value Date/Time   COLORURINE YELLOW 10/21/2018 [place] 10/21/2018 0647   LABSPEC 1.017 10/21/2018 0647   PHURINE 6.0 10/21/2018 0647   GLUCOSEU NEGATIVE 10/21/2018 0647   HGBUR NEGATIVE 10/21/2018 [place] 10/21/2018 [place] 10/21/2018 0647   PROTEINUR NEGATIVE 10/21/2018 0647   NITRITE NEGATIVE 10/21/2018 0647   LEUKOCYTESUR NEGATIVE 10/21/2018 0647   Sepsis Labs Invalid input(s): PROCALCITONIN,  WBC,  LACTICIDVEN Microbiology Recent Results (from the past 240 hour(s))  MRSA PCR Screening     Status: None   Collection Time: 10/21/18  5:30 PM  Result Value Ref Range Status   MRSA by PCR NEGATIVE NEGATIVE Final    Comment:        The GeneXpert MRSA Assay (FDA approved for NASAL specimens only), is one component of a comprehensive MRSA colonization surveillance program. It is not intended to diagnose MRSA infection nor to guide or monitor treatment for MRSA infections. Performed at [facility], [place] [address], [place], [place] [address]      Time coordinating discharge: 35  minutes  SIGNED:   Hosie Poisson, MD  [facility] 10/25/2018, 3:01 PM Pager   If 7PM-7AM, please contact night-coverage [email or web address] Password [email or web address]

## 2018-10-25 NOTE — Progress Notes (Signed)
CSW heard from Father Ronalee Belts from [place] was patient's home [employer] before moving.   He reports he will contact the [facility] in the area that will be covering the hospital tomorrow and let them know to see patient.   CSW received call from [address] in which they report they will visit patient between 8:30 am -12:00 pm tomorrow to offer patient communion and prayer. CSW thanked the [place] for their willingness to acknowledge patient wishes.   [place], [place]

## 2018-10-26 DIAGNOSIS — S52552A Other extraarticular fracture of lower end of left radius, initial encounter for closed fracture: Secondary | ICD-10-CM | POA: Diagnosis not present

## 2018-10-26 DIAGNOSIS — S52502A Unspecified fracture of the lower end of left radius, initial encounter for closed fracture: Secondary | ICD-10-CM | POA: Diagnosis not present

## 2018-10-26 LAB — CBC WITH DIFFERENTIAL/PLATELET
ABS IMMATURE GRANULOCYTES: 0.06 10*3/uL (ref 0.00–0.07)
BASOS ABS: 0 10*3/uL (ref 0.0–0.1)
BASOS PCT: 0 %
Eosinophils Absolute: 0.2 10*3/uL (ref 0.0–0.5)
Eosinophils Relative: 2 %
HCT: 34.5 % — ABNORMAL LOW (ref 36.0–46.0)
Hemoglobin: 10 g/dL — ABNORMAL LOW (ref 12.0–15.0)
IMMATURE GRANULOCYTES: 1 %
LYMPHS PCT: 7 %
Lymphs Abs: 0.7 10*3/uL (ref 0.7–4.0)
MCH: 28.6 pg (ref 26.0–34.0)
MCHC: 29 g/dL — ABNORMAL LOW (ref 30.0–36.0)
MCV: 98.6 fL (ref 80.0–100.0)
Monocytes Absolute: 1.3 10*3/uL — ABNORMAL HIGH (ref 0.1–1.0)
Monocytes Relative: 12 %
NEUTROS ABS: 8.5 10*3/uL — AB (ref 1.7–7.7)
NEUTROS PCT: 78 %
PLATELETS: 264 10*3/uL (ref 150–400)
RBC: 3.5 MIL/uL — AB (ref 3.87–5.11)
RDW: 13.6 % (ref 11.5–15.5)
WBC: 10.8 10*3/uL — AB (ref 4.0–10.5)
nRBC: 0 % (ref 0.0–0.2)

## 2018-10-26 NOTE — Discharge Summary (Signed)
Physician Discharge Summary  Sheila Mcconnell DXI:[id number] DOB: 1979/02/03 DOA: 10/21/2018  PCP: Ma Hillock, DO  Admit date: 10/21/2018 Discharge date: 10/26/2018  Admitted From: ALF Disposition: SNF when bed avaiable.   Recommendations for Outpatient Follow-up:  1. Follow up with PCP in 1-2 weeks 2. Please obtain BMP/CBC in one week 3. F/u within one week of discharge for check of left wrist and possible splint/ cast change by Dr handy.      Discharge Condition:STABLE.  CODE STATUS: DNR Diet recommendation: Heart Healthy  Brief/Interim Summary: 84 y.o.femalewith medical history significant ofsevere COPD, chronic hypoxic respiratory failure, GERD, hypertension, dyslipidemia and osteoporosis. Patient admitted on 11/17 after a fall. Found to have left superior and inferior pubic rami fracture, left distal radius fracture, seen by trauma surgery, orthopedics. Urine analysis negative for infection. Chest radiograph with significant hyperinflation withpositive lung markings bilaterally. Forearm x-rays with comminuted, impacted and displaced fracture of the distal radial metaphysis. Displaced fracture of the distal ulnar metaphysis. Cortical disruption at the level of the left acetabulum, raising concern about minimal displaced fracture.  Discharge Diagnoses:  Active Problems:   Left wrist fracture   Wrist fracture  1.Acute leftwrist fracture, displaced distal radial and ulnar metaphysis. Patient   Admitted for observation , continue paincontrol with acetaminophen .  Follow  orthopedic hand surgery consultation and physical therapy recommendations. For Inferior pubic rami fracture, orthopedics recommends weightbearing as tolerated, will require platform walker due to her left distal radius fracture. For  left distal radius fracture ,  Dr Marcelino Scot saw the patient and recommendations given.  Minimize narcotic medications as the patient was easily oversedated by  them. Use only  Tylenol   2.Hypertension Well controlled. Resume home meds.    3.Severe COPD with reactive leukocytosis. No acute exacerbation, continue bronchodilator with with desonide/formoterol, DuoNeb and tiotropium.Patient recently on systemic steroids,presented with leukocytosis on admission, no pneumonia.  Leukocytosis improving. Repeat chest x-rays shows chronic interstitial prominence, no pneumonia or CHF. Recommend checking cbc in one week to evaluate resolution of leukocytosis.   4.Depression. Continue mirtazapine  Discharge Instructions  Discharge Instructions    Diet - low sodium heart healthy   Complete by:  As directed    Discharge instructions   Complete by:  As directed    Please follow up with Dr Marcelino Scot as recommended.  Please follow up with PCP in one week.     Allergies as of 10/26/2018      Reactions   Sulfa Antibiotics Nausea Only      Medication List    STOP taking these medications   ipratropium 17 MCG/ACT inhaler Commonly known as:  ATROVENT HFA   predniSONE 20 MG tablet Commonly known as:  DELTASONE     TAKE these medications   acetaminophen 500 MG tablet Commonly known as:  TYLENOL Take 500 mg by mouth every 6 (six) hours as needed for mild pain.   amLODipine 5 MG tablet Commonly known as:  NORVASC Take 1 tablet (5 mg total) daily by mouth.   budesonide-formoterol 160-4.5 MCG/ACT inhaler Commonly known as:  SYMBICORT Inhale 2 puffs into the lungs 2 (two) times daily.   docusate sodium 100 MG capsule Commonly known as:  COLACE Take 1 capsule (100 mg total) by mouth 2 (two) times daily.   furosemide 20 MG tablet Commonly known as:  LASIX Take 20 mg by mouth daily.   ipratropium-albuterol 0.5-2.5 (3) MG/3ML Soln Commonly known as:  DUONEB Take 3 mLs by nebulization 3 (three) times daily. What  changed:    when to take this  reasons to take this   mirtazapine 15 MG tablet Commonly known as:  REMERON Take 7.5 mg by mouth at  bedtime.   tiotropium 18 MCG inhalation capsule Commonly known as:  SPIRIVA Place 1 capsule (18 mcg total) into inhaler and inhale daily.      Contact information for after-discharge care    [facility] .   Service:  Skilled Nursing Contact information: [place] [phone] [phone]             Allergies  Allergen Reactions  . Sulfa Antibiotics Nausea Only    Consultations:  Orthopedics Dr Marcelino Scot.   Procedures/Studies: Dg Chest 1 View  Result Date: 10/21/2018 CLINICAL DATA:  Status post fall. Concern for chest injury. Initial encounter. EXAM: CHEST  1 VIEW COMPARISON:  Chest radiograph performed 07/20/2018 FINDINGS: The lungs are hyperexpanded, with flattening of the hemidiaphragms, compatible with COPD. Chronic peribronchial thickening is noted. There is no evidence of focal opacification, pleural effusion or pneumothorax. The cardiomediastinal silhouette is within normal limits. No acute osseous abnormalities are seen. IMPRESSION: Findings of COPD. No acute cardiopulmonary process seen. No displaced rib fractures identified. Electronically Signed   By: Garald Balding M.D.   On: 10/21/2018 04:56   Dg Forearm Left  Result Date: 10/23/2018 CLINICAL DATA:  Distal radial fracture.  Patient splint. EXAM: LEFT FOREARM - 2 VIEW COMPARISON:  10/21/2018. FINDINGS: Patient splinted. Fractures of the distal radius and ulna again noted. These are better aligned than on prior exam. Some angulation and displacement still noted. Diffuse osteopenia. IMPRESSION: Patient splinted. Fractures of the distal radius and ulna again noted. These are better aligned than on prior exam. Some angulation and displacement still noted. Electronically Signed   By: Marcello Moores  Register   On: 10/23/2018 11:18   Dg Forearm Left  Result Date: 10/21/2018 CLINICAL DATA:  Status post fall, with left wrist pain. Initial encounter. EXAM: LEFT FOREARM - 2  VIEW COMPARISON:  None. FINDINGS: There is a comminuted, impacted and displaced fracture of the distal radial metaphysis, better characterized on concurrent wrist radiographs. There is also a displaced fracture of the distal ulnar metaphysis. The proximal radius and ulna appear intact. No elbow joint effusion is seen. The carpal rows appear grossly intact. Alignment of the first carpometacarpal joint is not well assessed. IMPRESSION: 1. Comminuted, impacted and displaced fracture of the distal radial metaphysis, better characterized on concurrent wrist radiographs. 2. Displaced fracture of the distal ulnar metaphysis. 3. Alignment of the first carpometacarpal joint is not well assessed. Electronically Signed   By: Garald Balding M.D.   On: 10/21/2018 03:20   Dg Wrist 2 Views Left  Result Date: 10/23/2018 CLINICAL DATA:  Distal radial fracture.  Patient in a splint. EXAM: LEFT WRIST - 2 VIEW COMPARISON:  10/21/2018. FINDINGS: Patient is in a splint. Fractures of the distal radial and ulna are again noted. Better alignment noted than on prior exam. Some degree of displacement and posterior angulation noted. Diffuse osteopenia. IMPRESSION: The patient's pain. Distal radial and ulnar fractures are again noted. Better alignment noted than on prior exam. Some degree of displacement and posterior angulation noted. Electronically Signed   By: Marcello Moores  Register   On: 10/23/2018 11:19   Dg Wrist Complete Left  Result Date: 10/21/2018 CLINICAL DATA:  Evaluate for reduction EXAM: LEFT WRIST - COMPLETE 3+ VIEW COMPARISON:  10/21/2018 at 0246 hours FINDINGS: Comminuted intra-articular distal  radial/radial styloid fracture. Fracture fragments are in near anatomic alignment and position following reduction. Mild widening of the radiocarpal articulation. Mildly comminuted fracture of the ulnar styloid and distal ulna, in near anatomic alignment and position. Overlying cast obscures fine osseous detail. IMPRESSION: Improved  alignment status post reduction of comminuted distal radial and ulnar fractures, as above. Electronically Signed   By: Julian Hy M.D.   On: 10/21/2018 07:30   Dg Wrist Complete Left  Result Date: 10/21/2018 CLINICAL DATA:  Status post fall, with left wrist pain, acute onset. Initial encounter. EXAM: LEFT WRIST - COMPLETE 3+ VIEW COMPARISON:  None. FINDINGS: There is a comminuted, impacted and significantly dorsally displaced fracture of the distal radial metaphysis, with associated shortening. The carpal rows articulate with the distal fragments. There is also a displaced fracture of the distal ulnar metaphysis. The carpal rows appear grossly intact. The first carpometacarpal joint is difficult to fully assess, though it appears in grossly normal alignment on the cross-table view. Soft tissue swelling is noted about the wrist. IMPRESSION: 1. Comminuted, impacted and significantly dorsally displaced fracture of the distal radial metaphysis, with associated shortening. 2. Displaced fracture of the distal ulnar metaphysis. Electronically Signed   By: Garald Balding M.D.   On: 10/21/2018 03:22   Dg Pelvis Comp Min 3v  Result Date: 10/21/2018 CLINICAL DATA:  Acetabular fracture. EXAM: JUDET PELVIS - 3+ VIEW COMPARISON:  Left hip x-rays from same day. FINDINGS: Unchanged minimally displaced fracture of the left puboacetabular junction. Minimally displaced fracture of the left inferior pubic ramus. The hip joint spaces are preserved. The pubic symphysis and sacroiliac joints are intact. Osteopenia. Soft tissues are unremarkable. IMPRESSION: 1. Minimally displaced fracture of the left puboacetabular junction without clear extension into the acetabulum. 2. Minimally displaced fracture of the left inferior pubic ramus. Electronically Signed   By: Titus Dubin M.D.   On: 10/21/2018 11:15   Dg Chest Port 1 View  Result Date: 10/23/2018 CLINICAL DATA:  Shortness of breath. History of emphysema, former  smoker. EXAM: PORTABLE CHEST 1 VIEW COMPARISON:  Portable chest x-ray of October 21, 2018 FINDINGS: The lungs remain hyperinflated. The hemidiaphragms are flattened. The interstitial markings are coarse. There is no pleural effusion or pneumothorax. The heart and pulmonary vascularity are normal. There is calcification in the wall of the thoracic aorta. The observed bony thorax is unremarkable. IMPRESSION: COPD. Chronic interstitial prominence. No pneumonia, CHF, nor other acute cardiopulmonary abnormality. Electronically Signed   By: David  Martinique M.D.   On: 10/23/2018 11:01   Dg Hip Unilat W Or Wo Pelvis 2-3 Views Left  Result Date: 10/21/2018 CLINICAL DATA:  Status post fall, with left hip pain. Initial encounter. EXAM: DG HIP (WITH OR WITHOUT PELVIS) 2-3V LEFT COMPARISON:  None. FINDINGS: Cortical disruption at the level of the left acetabulum raises concern for a minimally displaced fracture. Would correlate for associated symptoms. Both femoral heads are seated normally within their respective acetabula. The proximal left femur appears intact. No significant degenerative change is appreciated. The sacroiliac joints are unremarkable in appearance. The visualized bowel gas pattern is grossly unremarkable in appearance. Diffuse vascular calcifications are seen. IMPRESSION: 1. Cortical disruption at the level of the left acetabulum raises concern for a minimally displaced fracture. Would correlate for associated symptoms. 2. Diffuse vascular calcifications seen. Electronically Signed   By: Garald Balding M.D.   On: 10/21/2018 03:24      Subjective: No pain at this time, no chest pain or sob. No nausea or  vomiting.   Discharge Exam: Vitals:   10/26/18 0816 10/26/18 1220  BP:  (!) 130/53  Pulse:  (!) 109  Resp:  14  Temp:  98.4 F (36.9 C)  SpO2: 97% 99%   Vitals:   10/25/18 2045 10/26/18 0358 10/26/18 0816 10/26/18 1220  BP:  128/64  (!) 130/53  Pulse: (!) 107 83  (!) 109  Resp: 18 16   14   Temp:  97.9 F (36.6 C)  98.4 F (36.9 C)  TempSrc:  Oral  Oral  SpO2: 92% 99% 97% 99%  Weight:      Height:        General: Pt is alert, awake, not in acute distress Cardiovascular: RRR, S1/S2 +, no rubs, no gallops Respiratory: CTA bilaterally, no wheezing, no rhonchi Abdominal: Soft, NT, ND, bowel sounds + Extremities: no pedal edema.     The results of significant diagnostics from this hospitalization (including imaging, microbiology, ancillary and laboratory) are listed below for reference.     Microbiology: Recent Results (from the past 240 hour(s))  MRSA PCR Screening     Status: None   Collection Time: 10/21/18  5:30 PM  Result Value Ref Range Status   MRSA by PCR NEGATIVE NEGATIVE Final    Comment:        The GeneXpert MRSA Assay (FDA approved for NASAL specimens only), is one component of a comprehensive MRSA colonization surveillance program. It is not intended to diagnose MRSA infection nor to guide or monitor treatment for MRSA infections. Performed at [facility], [place] [address], [place], [place] [address]      Labs: BNP (last 3 results) Recent Labs    11/09/17 1511 06/04/18 1358 07/04/18 0014  BNP 10.4 24.4 86.7   Basic Metabolic Panel: Recent Labs  Lab 10/21/18 0400 10/22/18 0346 10/24/18 0312  NA 141 138 136  K 3.5 3.5 4.1  CL 95* 96* 93*  CO2 36* 38* 31  GLUCOSE 156* 137* 94  BUN 17 20 14   CREATININE 0.58 0.64 0.61  CALCIUM 9.0 8.3* 8.7*  MG 1.9  --   --    Liver Function Tests: Recent Labs  Lab 10/21/18 0400 10/24/18 0312  AST 18 16  ALT 30 16  ALKPHOS 76 68  BILITOT 0.5 1.9*  PROT 5.8* 5.5*  ALBUMIN 3.2* 2.6*   No results for input(s): LIPASE, AMYLASE in the last 168 hours. No results for input(s): AMMONIA in the last 168 hours. CBC: Recent Labs  Lab 10/21/18 0400 10/22/18 1042 10/23/18 0147 10/24/18 0312 10/25/18 0209 10/26/18 0201  WBC 30.8* 16.5* 17.2* 16.5* 13.1* 10.8*  NEUTROABS 28.4*  --    --  14.3* 10.8* 8.5*  HGB 12.7 10.7* 10.4* 10.4* 9.9* 10.0*  HCT 43.9 35.8* 36.4 36.5 33.4* 34.5*  MCV 100.7* 100.6* 101.1* 100.0 97.1 98.6  PLT 300 192 207 236 271 264   Cardiac Enzymes: No results for input(s): CKTOTAL, CKMB, CKMBINDEX, TROPONINI in the last 168 hours. BNP: Invalid input(s): POCBNP CBG: No results for input(s): GLUCAP in the last 168 hours. D-Dimer No results for input(s): DDIMER in the last 72 hours. Hgb A1c No results for input(s): HGBA1C in the last 72 hours. Lipid Profile No results for input(s): CHOL, HDL, LDLCALC, TRIG, CHOLHDL, LDLDIRECT in the last 72 hours. Thyroid function studies No results for input(s): TSH, T4TOTAL, T3FREE, THYROIDAB in the last 72 hours.  Invalid input(s): FREET3 Anemia work up No results for input(s): VITAMINB12, FOLATE, FERRITIN, TIBC, IRON, RETICCTPCT in  the last 72 hours. Urinalysis    Component Value Date/Time   COLORURINE YELLOW 10/21/2018 [place] 10/21/2018 0647   LABSPEC 1.017 10/21/2018 0647   PHURINE 6.0 10/21/2018 0647   GLUCOSEU NEGATIVE 10/21/2018 0647   HGBUR NEGATIVE 10/21/2018 [place] NEGATIVE 10/21/2018 [place] 10/21/2018 0647   PROTEINUR NEGATIVE 10/21/2018 0647   NITRITE NEGATIVE 10/21/2018 0647   LEUKOCYTESUR NEGATIVE 10/21/2018 0647   Sepsis Labs Invalid input(s): PROCALCITONIN,  WBC,  LACTICIDVEN Microbiology Recent Results (from the past 240 hour(s))  MRSA PCR Screening     Status: None   Collection Time: 10/21/18  5:30 PM  Result Value Ref Range Status   MRSA by PCR NEGATIVE NEGATIVE Final    Comment:        The GeneXpert MRSA Assay (FDA approved for NASAL specimens only), is one component of a comprehensive MRSA colonization surveillance program. It is not intended to diagnose MRSA infection nor to guide or monitor treatment for MRSA infections. Performed at [facility], [place] [address], [place], [place] [address]      Time  coordinating discharge: 32 minutes  SIGNED:   Hosie Poisson, MD  [facility] 10/26/2018, 5:40 PM Pager   If 7PM-7AM, please contact night-coverage [email or web address] Password [email or web address]   Pt seen and examined at bedside.  No change in exam and we currently awaiting bed at SNF.    Hosie Poisson, MD [phone]

## 2018-10-26 NOTE — Progress Notes (Signed)
Physical Therapy Treatment Patient Details Name: Sheila Mcconnell MRN: [id number] DOB: July 24, 1927 Today's Date: 10/26/2018    History of Present Illness Pt is 84 y.o. female presenting with left wrist fracture and left superior and inferior pubic rami fracture secondary to fall at ALF. PMH significant for medical history significant of severe COPD, chronic hypoxic respiratory failure, GERD, hypertension, dyslipidemia, dependence on oxygen, HOH, and osteoporosis.    PT Comments    Pt requesting to use BSC on entry, but otherwise more limited in conversation today. Pt is progressing towards goals and is currently modA for bed mobility, transfers and stepping transfer into recliner. Pt is limited in safe mobility by increased pain and decreased strength and endurance. D/c plans remain appropriate at this time.    Follow Up Recommendations  SNF;Supervision/Assistance - 24 hour     Equipment Recommendations  Other (comment)(tbd at next venue)       Precautions / Restrictions Precautions Precautions: Fall Required Braces or Orthoses: Other Brace/Splint Other Brace/Splint: left wrist splint Restrictions Weight Bearing Restrictions: Yes LUE Weight Bearing: Non weight bearing LLE Weight Bearing: Weight bearing as tolerated Other Position/Activity Restrictions: awaiting left wrist consult for weight bearing status    Mobility  Bed Mobility Overal bed mobility: Needs Assistance Bed Mobility: Supine to Sit     Supine to sit: Mod assist;HOB elevated     General bed mobility comments: mod A for truncal support, and pad scoot of hips EoB   Transfers Overall transfer level: Needs assistance Equipment used: 1 person hand held assist;Left platform walker Transfers: Stand Pivot Transfers;Sit to/from Stand Sit to Stand: Mod assist Stand pivot transfers: Mod assist       General transfer comment: modA for power up into standing, vc for forward lean, requires assist for steadying once in  standing to get feet underneath her. modA and maximal cuing for pivoting to [facility]  Ambulation/Gait Ambulation/Gait assistance: Mod assist Gait Distance (Feet): 2 Feet Assistive device: 1 person hand held assist Gait Pattern/deviations: Shuffle;Decreased stride length;Antalgic Gait velocity: slowed Gait velocity interpretation: <1.31 ft/sec, indicative of household ambulator General Gait Details: modA with maximal cuing for stepping 2 feet from [employer] to recliner on L         Balance Overall balance assessment: Needs assistance Sitting-balance support: Single extremity supported;Feet supported Sitting balance-Leahy Scale: Fair Sitting balance - Comments: pt able to weight shift to accomplish self-care on BSC.   Standing balance support: Bilateral upper extremity supported Standing balance-Leahy Scale: Poor                              Cognition Arousal/Alertness: Awake/alert Behavior During Therapy: WFL for tasks assessed/performed;Flat affect Overall Cognitive Status: Impaired/Different from baseline Area of Impairment: Following commands                       Following Commands: Follows one step commands with increased time       General Comments: Pt very flat today. but aware of limitations today.         General Comments General comments (skin integrity, edema, etc.): Pt with c/o of soreness in her bottom with sitting in recliner, sacral pad in place. PT placed geomat under patient and instructed her in weightshifting to relieve soreness. Pt on 2L O2 via [place] throughout session, SaO2 dropped to 89%O2 with movement however rebounded to 95%O2 with sitting in recliner      Pertinent Vitals/Pain Pain Assessment:  Faces Faces Pain Scale: Hurts little more Pain Location: left wrist, left pelvis Pain Descriptors / Indicators: Grimacing;Discomfort;Tender Pain Intervention(s): Limited activity within patient's tolerance;Monitored during session;Repositioned            PT Goals (current goals can now be found in the care plan section) Acute Rehab PT Goals Patient Stated Goal: "go home" (ALF) PT Goal Formulation: With patient Time For Goal Achievement: 11/05/18 Potential to Achieve Goals: Fair Progress towards PT goals: Progressing toward goals    Frequency    Min 3X/week      PT Plan Current plan remains appropriate       AM-PAC PT "6 Clicks" Daily Activity  Outcome Measure  Difficulty turning over in bed (including adjusting bedclothes, sheets and blankets)?: Unable Difficulty moving from lying on back to sitting on the side of the bed? : Unable Difficulty sitting down on and standing up from a chair with arms (e.g., wheelchair, bedside commode, etc,.)?: Unable Help needed moving to and from a bed to chair (including a wheelchair)?: Total Help needed walking in hospital room?: Total Help needed climbing 3-5 steps with a railing? : Total 6 Click Score: 6    End of Session Equipment Utilized During Treatment: Gait belt;Oxygen Activity Tolerance: Patient tolerated treatment well Patient left: in chair;with call bell/phone within reach;with chair alarm set Nurse Communication: Mobility status PT Visit Diagnosis: Unsteadiness on feet (R26.81);History of falling (Z91.81);Muscle weakness (generalized) (M62.81);Other abnormalities of gait and mobility (R26.89);Pain Pain - Right/Left: Left Pain - part of body: Arm;Hand;Leg     Time: 6269-4854 PT Time Calculation (min) (ACUTE ONLY): 18 min  Charges:  $Therapeutic Activity: 8-22 mins                     Ramonda Galyon B. Migdalia Dk PT, DPT Acute Rehabilitation Services Pager [phone] Office [phone]    [place] 10/26/2018, 12:36 PM

## 2018-10-27 DIAGNOSIS — S52552A Other extraarticular fracture of lower end of left radius, initial encounter for closed fracture: Secondary | ICD-10-CM | POA: Diagnosis not present

## 2018-10-27 DIAGNOSIS — S32402A Unspecified fracture of left acetabulum, initial encounter for closed fracture: Secondary | ICD-10-CM | POA: Diagnosis not present

## 2018-10-27 LAB — CBC WITH DIFFERENTIAL/PLATELET
Abs Immature Granulocytes: 0.06 10*3/uL (ref 0.00–0.07)
BASOS ABS: 0 10*3/uL (ref 0.0–0.1)
Basophils Relative: 0 %
Eosinophils Absolute: 0.2 10*3/uL (ref 0.0–0.5)
Eosinophils Relative: 2 %
HCT: 33.7 % — ABNORMAL LOW (ref 36.0–46.0)
HEMOGLOBIN: 10.2 g/dL — AB (ref 12.0–15.0)
Immature Granulocytes: 1 %
LYMPHS PCT: 6 %
Lymphs Abs: 0.6 10*3/uL — ABNORMAL LOW (ref 0.7–4.0)
MCH: 29.6 pg (ref 26.0–34.0)
MCHC: 30.3 g/dL (ref 30.0–36.0)
MCV: 97.7 fL (ref 80.0–100.0)
MONO ABS: 1 10*3/uL (ref 0.1–1.0)
Monocytes Relative: 10 %
NEUTROS ABS: 8.1 10*3/uL — AB (ref 1.7–7.7)
NEUTROS PCT: 81 %
Platelets: 242 10*3/uL (ref 150–400)
RBC: 3.45 MIL/uL — AB (ref 3.87–5.11)
RDW: 13.6 % (ref 11.5–15.5)
WBC: 9.9 10*3/uL (ref 4.0–10.5)
nRBC: 0 % (ref 0.0–0.2)

## 2018-10-27 MED ORDER — SENNOSIDES-DOCUSATE SODIUM 8.6-50 MG PO TABS
2.0000 | ORAL_TABLET | Freq: Two times a day (BID) | ORAL | Status: DC
  Administered 2018-10-27 – 2018-10-29 (×4): 2 via ORAL
  Filled 2018-10-27 (×4): qty 2

## 2018-10-27 MED ORDER — POLYETHYLENE GLYCOL 3350 17 G PO PACK
17.0000 g | PACK | Freq: Two times a day (BID) | ORAL | Status: DC
  Administered 2018-10-27 – 2018-10-29 (×3): 17 g via ORAL
  Filled 2018-10-27 (×3): qty 1

## 2018-10-27 MED ORDER — MAGNESIUM HYDROXIDE 400 MG/5ML PO SUSP: 30.0000 mL | Freq: Every day | ORAL | Status: DC | PRN

## 2018-10-27 NOTE — Progress Notes (Signed)
PROGRESS NOTE    Sheila Mcconnell  YSA:[id number] DOB: Oct 23, 1927 DOA: 10/21/2018 PCP: Ma Hillock, DO    Brief Narrative: 84 y.o.femalewith medical history significant ofsevere COPD, chronic hypoxic respiratory failure, GERD, hypertension, dyslipidemia and osteoporosis. Patient admitted on 11/17 after a fall. Found to have left superior and inferior pubic rami fracture, left distal radius fracture, seen by trauma surgery, orthopedics. Urine analysis negative for infection. Chest radiograph with significant hyperinflation withpositive lung markings bilaterally. Forearm x-rays with comminuted, impacted and displaced fracture of the distal radial metaphysis. Displaced fracture of the distal ulnar metaphysis. Cortical disruption at the level of the left acetabulum, raising concern about minimal displaced fracture.  Assessment & Plan:   Active Problems:   Left wrist fracture   Wrist fracture   Acute leftwrist fracture, displaced distal radial and ulnar metaphysis. Patient Admitted for observation, continue paincontrol with acetaminophen.Follow orthopedic hand surgery consultation and physical therapy recommendations. ForInferior pubic rami fracture, orthopedics recommendsweightbearing as tolerated, will require platform walker due to her left distal radius fracture. For left distal radius fracture ,  Dr Marcelino Scot saw the patient and recommendations given.  Minimize narcotic medications as the patient was easily oversedated by them. Use only Tylenol   2.Hypertension Well controlled. Resume home meds.    3.Severe COPD with reactive leukocytosis. No acute exacerbation, continue bronchodilator with with desonide/formoterol, DuoNeb and tiotropium.Patient recently on systemic steroids,presented with leukocytosis on admission, no pneumonia.Leukocytosis improving.Repeatchest x-raysshows chronic interstitial prominence, no pneumonia or CHF. Recommend checking cbc in  one week to evaluate resolution of leukocytosis.   4.Depression. Continue mirtazapine    DVT prophylaxis: Geralynn Ochs  Code Status: DNR Family Communication: None at bedside Disposition Plan: Waiting for bed at SNF  Consultants:   Orthopedics  Procedures: None  Antimicrobials: None   Subjective: No new complaint  Objective: Vitals:   10/26/18 1220 10/26/18 1957 10/27/18 0340 10/27/18 0904  BP: (!) 130/53 124/61 133/68   Pulse: (!) 109 (!) 112 (!) 102 100  Resp: 14 14 14 16   Temp: 98.4 F (36.9 C) 98.2 F (36.8 C) 97.8 F (36.6 C)   TempSrc: Oral Oral Oral   SpO2: 99% 95% 100% 97%  Weight:      Height:        Intake/Output Summary (Last 24 hours) at 10/27/2018 1714 Last data filed at 10/27/2018 0900 Gross per 24 hour  Intake 120 ml  Output -  Net 120 ml   Filed Weights   10/24/18 0620  Weight: 47 kg    Examination:  General exam: Appears calm and comfortable  Respiratory system: Clear to auscultation. Respiratory effort normal. Cardiovascular system: S1 & S2 heard, RRR. No JVD, murmurs, . No pedal edema. Gastrointestinal system: Abdomen is nondistended, soft and nontender. Normal bowel sounds heard. Central nervous system: Alert and oriented. No focal neurological deficits. Extremities: No pedal edema Skin: No rashes, lesions or ulcers Psychiatry:  Mood & affect appropriate.     Data Reviewed: I have personally reviewed following labs and imaging studies  CBC: Recent Labs  Lab 10/21/18 0400  10/23/18 0147 10/24/18 0312 10/25/18 0209 10/26/18 0201 10/27/18 0341  WBC 30.8*   < > 17.2* 16.5* 13.1* 10.8* 9.9  NEUTROABS 28.4*  --   --  14.3* 10.8* 8.5* 8.1*  HGB 12.7   < > 10.4* 10.4* 9.9* 10.0* 10.2*  HCT 43.9   < > 36.4 36.5 33.4* 34.5* 33.7*  MCV 100.7*   < > 101.1* 100.0 97.1 98.6 97.7  PLT 300   < >  207 236 271 264 242   < > = values in this interval not displayed.   Basic Metabolic Panel: Recent Labs  Lab 10/21/18 0400 10/22/18 0346  10/24/18 0312  NA 141 138 136  K 3.5 3.5 4.1  CL 95* 96* 93*  CO2 36* 38* 31  GLUCOSE 156* 137* 94  BUN 17 20 14   CREATININE 0.58 0.64 0.61  CALCIUM 9.0 8.3* 8.7*  MG 1.9  --   --    GFR: Estimated Creatinine Clearance: 35.4 mL/min (by C-G formula based on SCr of 0.61 mg/dL). Liver Function Tests: Recent Labs  Lab 10/21/18 0400 10/24/18 0312  AST 18 16  ALT 30 16  ALKPHOS 76 68  BILITOT 0.5 1.9*  PROT 5.8* 5.5*  ALBUMIN 3.2* 2.6*   No results for input(s): LIPASE, AMYLASE in the last 168 hours. No results for input(s): AMMONIA in the last 168 hours. Coagulation Profile: No results for input(s): INR, PROTIME in the last 168 hours. Cardiac Enzymes: No results for input(s): CKTOTAL, CKMB, CKMBINDEX, TROPONINI in the last 168 hours. BNP (last 3 results) No results for input(s): PROBNP in the last 8760 hours. HbA1C: No results for input(s): HGBA1C in the last 72 hours. CBG: No results for input(s): GLUCAP in the last 168 hours. Lipid Profile: No results for input(s): CHOL, HDL, LDLCALC, TRIG, CHOLHDL, LDLDIRECT in the last 72 hours. Thyroid Function Tests: No results for input(s): TSH, T4TOTAL, FREET4, T3FREE, THYROIDAB in the last 72 hours. Anemia Panel: No results for input(s): VITAMINB12, FOLATE, FERRITIN, TIBC, IRON, RETICCTPCT in the last 72 hours. Sepsis Labs: No results for input(s): PROCALCITON, LATICACIDVEN in the last 168 hours.  Recent Results (from the past 240 hour(s))  MRSA PCR Screening     Status: None   Collection Time: 10/21/18  5:30 PM  Result Value Ref Range Status   MRSA by PCR NEGATIVE NEGATIVE Final    Comment:        The GeneXpert MRSA Assay (FDA approved for NASAL specimens only), is one component of a comprehensive MRSA colonization surveillance program. It is not intended to diagnose MRSA infection nor to guide or monitor treatment for MRSA infections. Performed at [facility], [place] [address], [place], [place] [address]            Radiology Studies: No results found.      Scheduled Meds: . acetaminophen  500 mg Oral Q6H  . docusate sodium  100 mg Oral BID  . enoxaparin (LOVENOX) injection  40 mg Subcutaneous Daily  . mirtazapine  7.5 mg Oral QHS  . mometasone-formoterol  2 puff Inhalation BID  . sodium chloride flush  3 mL Intravenous Q12H   Continuous Infusions: . sodium chloride       LOS: 1 day    Time spent: 25 minutes   Hosie Poisson, MD [facility] Pager [phone]  If 7PM-7AM, please contact night-coverage [email or web address] Password [facility] 10/27/2018, 5:14 PM

## 2018-10-27 NOTE — Progress Notes (Signed)
Extensive swelling noted on patient's L hand. It appeared to look like a large blister on patient's knuckles. No numbness reported, but complained of some tingling and pain. Removed ACE wrap. It appeared as though the splint was digging on the back of the patient's L hand. Elevated some more. Reported assessment to MD. Madaline Brilliant to rewrap ACE loosely per MD.

## 2018-10-27 NOTE — Progress Notes (Signed)
CSW reached out to [facility] multiple times yesterday 11/22 with no response. CSW received text from admissions late evening stating she had been in meetings all day and was unavailable. Did not answer CSW 's questions regarding insurance authorization for patient.  CSW reached out to admissions today via text and call. No one was available. CSW reached out to main office line in which it is reported there is no one in office today who can check on insurance authorizations.   CSW will continue to follow up.   [place], [place]

## 2018-10-28 DIAGNOSIS — S52552A Other extraarticular fracture of lower end of left radius, initial encounter for closed fracture: Secondary | ICD-10-CM | POA: Diagnosis not present

## 2018-10-28 DIAGNOSIS — S62102D Fracture of unspecified carpal bone, left wrist, subsequent encounter for fracture with routine healing: Secondary | ICD-10-CM | POA: Diagnosis not present

## 2018-10-28 DIAGNOSIS — S32402A Unspecified fracture of left acetabulum, initial encounter for closed fracture: Secondary | ICD-10-CM | POA: Diagnosis not present

## 2018-10-28 LAB — CBC
HCT: 34.8 % — ABNORMAL LOW (ref 36.0–46.0)
Hemoglobin: 10.3 g/dL — ABNORMAL LOW (ref 12.0–15.0)
MCH: 28.8 pg (ref 26.0–34.0)
MCHC: 29.6 g/dL — AB (ref 30.0–36.0)
MCV: 97.2 fL (ref 80.0–100.0)
PLATELETS: 292 10*3/uL (ref 150–400)
RBC: 3.58 MIL/uL — ABNORMAL LOW (ref 3.87–5.11)
RDW: 13.4 % (ref 11.5–15.5)
WBC: 10.1 10*3/uL (ref 4.0–10.5)
nRBC: 0 % (ref 0.0–0.2)

## 2018-10-28 LAB — DIFFERENTIAL
Abs Immature Granulocytes: 0.1 10*3/uL — ABNORMAL HIGH (ref 0.00–0.07)
BASOS ABS: 0 10*3/uL (ref 0.0–0.1)
BASOS PCT: 0 %
EOS ABS: 0.1 10*3/uL (ref 0.0–0.5)
Eosinophils Relative: 1 %
Immature Granulocytes: 1 %
Lymphocytes Relative: 8 %
Lymphs Abs: 0.8 10*3/uL (ref 0.7–4.0)
MONO ABS: 0.8 10*3/uL (ref 0.1–1.0)
MONOS PCT: 8 %
NEUTROS PCT: 82 %
Neutro Abs: 8.2 10*3/uL — ABNORMAL HIGH (ref 1.7–7.7)

## 2018-10-28 LAB — CREATININE, SERUM
CREATININE: 0.57 mg/dL (ref 0.44–1.00)
GFR calc Af Amer: >60 mL/min (ref >60)

## 2018-10-28 MED ORDER — KETOROLAC TROMETHAMINE 30 MG/ML IJ SOLN
15.0000 mg | Freq: Once | INTRAMUSCULAR | Status: AC
  Administered 2018-10-29: 15 mg via INTRAVENOUS
  Filled 2018-10-28: qty 1

## 2018-10-28 NOTE — Plan of Care (Signed)

## 2018-10-28 NOTE — Progress Notes (Signed)
PROGRESS NOTE    Sheila Mcconnell  PJK:[id number] DOB: 03-21-1927 DOA: 10/21/2018 PCP: Ma Hillock, DO    Brief Narrative: 84 y.o.femalewith medical history significant ofsevere COPD, chronic hypoxic respiratory failure, GERD, hypertension, dyslipidemia and osteoporosis. Patient admitted on 11/17 after a fall. Found to have left superior and inferior pubic rami fracture, left distal radius fracture, seen by trauma surgery, orthopedics. Urine analysis negative for infection. Chest radiograph with significant hyperinflation withpositive lung markings bilaterally. Forearm x-rays with comminuted, impacted and displaced fracture of the distal radial metaphysis. Displaced fracture of the distal ulnar metaphysis. Cortical disruption at the level of the left acetabulum, raising concern about minimal displaced fracture.  Assessment & Plan:   Active Problems:   Left wrist fracture   Wrist fracture   Acute leftwrist fracture, displaced distal radial and ulnar metaphysis. Patient Admitted for observation, continue paincontrol with acetaminophen.Follow orthopedic hand surgery consultation and physical therapy recommendations. ForInferior pubic rami fracture, orthopedics recommendsweightbearing as tolerated, will require platform walker due to her left distal radius fracture. For left distal radius fracture ,  Dr Marcelino Scot saw the patient and recommendations given.   Minimize narcotic medications as the patient was easily oversedated by them. Use only Tylenol for pain control.    2.Hypertension Better controlled.    3.Severe COPD with reactive leukocytosis. No acute exacerbation, continue bronchodilator with with desonide/formoterol, DuoNeb and tiotropium.patient is on 2 lit at baseline.   4.Depression. Continue mirtazapine  5. Constipation:  Resolved.     DVT prophylaxis: Geralynn Ochs  Code Status: DNR Family Communication: None at bedside Disposition Plan:  Waiting for bed at SNF  Consultants:   Orthopedics  Procedures: None  Antimicrobials: None   Subjective: No new complaints  Objective: Vitals:   10/27/18 2040 10/28/18 0640 10/28/18 0839 10/28/18 1400  BP: 125/65 (!) 105/99  128/64  Pulse: (!) 111 (!) 105  (!) 102  Resp: 18 18  16   Temp:  98.4 F (36.9 C)  98.6 F (37 C)  TempSrc:  Oral  Oral  SpO2: 96% 99% 100% 100%  Weight:      Height:        Intake/Output Summary (Last 24 hours) at 10/28/2018 1615 Last data filed at 10/28/2018 1300 Gross per 24 hour  Intake 360 ml  Output -  Net 360 ml   Filed Weights   10/24/18 0620  Weight: 47 kg    Examination:  General exam: Appears calm and comfortable ,not in any distress.  Respiratory system: Clear to auscultation. Respiratory effort normal. Cardiovascular system: S1 & S2 heard, RRR. No JVD,  . No pedal edema. Gastrointestinal system: Abdomen is soft NT ND BS+ Central nervous system: Alert and oriented.  Non focal.  Extremities: No pedal edema , left forearm bandaged.  Skin: No rashes, lesions or ulcers Psychiatry:  Mood & affect appropriate.     Data Reviewed: I have personally reviewed following labs and imaging studies  CBC: Recent Labs  Lab 10/24/18 0312 10/25/18 0209 10/26/18 0201 10/27/18 0341 10/28/18 0411  WBC 16.5* 13.1* 10.8* 9.9 10.1  NEUTROABS 14.3* 10.8* 8.5* 8.1* 8.2*  HGB 10.4* 9.9* 10.0* 10.2* 10.3*  HCT 36.5 33.4* 34.5* 33.7* 34.8*  MCV 100.0 97.1 98.6 97.7 97.2  PLT 236 271 264 242 809   Basic Metabolic Panel: Recent Labs  Lab 10/22/18 0346 10/24/18 0312 10/28/18 0411  NA 138 136  --   K 3.5 4.1  --   CL 96* 93*  --   CO2 38* 31  --  GLUCOSE 137* 94  --   BUN 20 14  --   CREATININE 0.64 0.61 0.57  CALCIUM 8.3* 8.7*  --    GFR: Estimated Creatinine Clearance: 35.4 mL/min (by C-G formula based on SCr of 0.57 mg/dL). Liver Function Tests: Recent Labs  Lab 10/24/18 0312  AST 16  ALT 16  ALKPHOS 68  BILITOT 1.9*    PROT 5.5*  ALBUMIN 2.6*   No results for input(s): LIPASE, AMYLASE in the last 168 hours. No results for input(s): AMMONIA in the last 168 hours. Coagulation Profile: No results for input(s): INR, PROTIME in the last 168 hours. Cardiac Enzymes: No results for input(s): CKTOTAL, CKMB, CKMBINDEX, TROPONINI in the last 168 hours. BNP (last 3 results) No results for input(s): PROBNP in the last 8760 hours. HbA1C: No results for input(s): HGBA1C in the last 72 hours. CBG: No results for input(s): GLUCAP in the last 168 hours. Lipid Profile: No results for input(s): CHOL, HDL, LDLCALC, TRIG, CHOLHDL, LDLDIRECT in the last 72 hours. Thyroid Function Tests: No results for input(s): TSH, T4TOTAL, FREET4, T3FREE, THYROIDAB in the last 72 hours. Anemia Panel: No results for input(s): VITAMINB12, FOLATE, FERRITIN, TIBC, IRON, RETICCTPCT in the last 72 hours. Sepsis Labs: No results for input(s): PROCALCITON, LATICACIDVEN in the last 168 hours.  Recent Results (from the past 240 hour(s))  MRSA PCR Screening     Status: None   Collection Time: 10/21/18  5:30 PM  Result Value Ref Range Status   MRSA by PCR NEGATIVE NEGATIVE Final    Comment:        The GeneXpert MRSA Assay (FDA approved for NASAL specimens only), is one component of a comprehensive MRSA colonization surveillance program. It is not intended to diagnose MRSA infection nor to guide or monitor treatment for MRSA infections. Performed at [facility], [place] [address] [place], [place] [address]          Radiology Studies: No results found.      Scheduled Meds: . acetaminophen  500 mg Oral Q6H  . enoxaparin (LOVENOX) injection  40 mg Subcutaneous Daily  . mirtazapine  7.5 mg Oral QHS  . mometasone-formoterol  2 puff Inhalation BID  . polyethylene glycol  17 g Oral BID  . senna-docusate  2 tablet Oral BID  . sodium chloride flush  3 mL Intravenous Q12H   Continuous Infusions: . sodium chloride        LOS: 1 day    Time spent: 25 minutes   Hosie Poisson, MD [facility] Pager [phone]  If 7PM-7AM, please contact night-coverage [email or web address] Password [facility] 10/28/2018, 4:15 PM

## 2018-10-28 NOTE — Progress Notes (Signed)
Patient continues to await insurance authorization to admit to [facility]. Authorization not yet received, will not receive authorization from [facility] over the weekend.  CSW to follow up tomorrow.  Laveda Abbe, [place] Clinical Social Worker [phone]

## 2018-10-28 NOTE — Progress Notes (Signed)
Ortho Trauma Note:  Discussed with nurse concern regarding left upper extremity splint. Swelling in expected. Splint holding reduction will discuss with Dr. Marcelino Scot to review early this week. Skin appears okay for now, no change in management.  Shona Needles, MD [facility] [phone] (phone)

## 2018-10-29 DIAGNOSIS — S32592A Other specified fracture of left pubis, initial encounter for closed fracture: Secondary | ICD-10-CM

## 2018-10-29 DIAGNOSIS — S52552A Other extraarticular fracture of lower end of left radius, initial encounter for closed fracture: Secondary | ICD-10-CM | POA: Diagnosis not present

## 2018-10-29 DIAGNOSIS — S62102A Fracture of unspecified carpal bone, left wrist, initial encounter for closed fracture: Secondary | ICD-10-CM

## 2018-10-29 LAB — CBC WITH DIFFERENTIAL/PLATELET
ABS IMMATURE GRANULOCYTES: 0.04 10*3/uL (ref 0.00–0.07)
BASOS PCT: 1 %
Basophils Absolute: 0 10*3/uL (ref 0.0–0.1)
Eosinophils Absolute: 0.1 10*3/uL (ref 0.0–0.5)
Eosinophils Relative: 2 %
HCT: 33.7 % — ABNORMAL LOW (ref 36.0–46.0)
Hemoglobin: 10.1 g/dL — ABNORMAL LOW (ref 12.0–15.0)
Immature Granulocytes: 1 %
Lymphocytes Relative: 10 %
Lymphs Abs: 0.8 10*3/uL (ref 0.7–4.0)
MCH: 29.2 pg (ref 26.0–34.0)
MCHC: 30 g/dL (ref 30.0–36.0)
MCV: 97.4 fL (ref 80.0–100.0)
MONO ABS: 0.6 10*3/uL (ref 0.1–1.0)
MONOS PCT: 8 %
NEUTROS ABS: 6.4 10*3/uL (ref 1.7–7.7)
Neutrophils Relative %: 78 %
PLATELETS: 328 10*3/uL (ref 150–400)
RBC: 3.46 MIL/uL — ABNORMAL LOW (ref 3.87–5.11)
RDW: 13.8 % (ref 11.5–15.5)
WBC: 8.1 10*3/uL (ref 4.0–10.5)
nRBC: 0 % (ref 0.0–0.2)

## 2018-10-29 NOTE — Plan of Care (Signed)
  Problem: Coping: Goal: Level of anxiety will decrease Outcome: Progressing   Problem: Pain Managment: Goal: General experience of comfort will improve Outcome: Progressing   Problem: Safety: Goal: Ability to remain free from injury will improve Outcome: Progressing   Problem: Skin Integrity: Goal: Risk for impaired skin integrity will decrease Outcome: Progressing   

## 2018-10-29 NOTE — Progress Notes (Signed)
Patient will DC to: [facility] Anticipated DC date: 10/29/18 Family notified: [facility] by: Corey Harold  Per MD patient ready for DC to [facility], patient, patient's family, and facility notified of DC. Discharge Summary sent to facility. RN given number for report [phone]. DC packet on chart. Ambulance transport requested for patient.  CSW signing off.  [place], [place]

## 2018-10-29 NOTE — Discharge Summary (Signed)
Physician Discharge Summary  Sheila Mcconnell DGU:[id number] 84 09/20/1927 84 10/21/2018  PCP: Ma Hillock, DO  Admit date: 10/21/2018 84 10/29/2018  Admitted From: ALF Disposition: SNF when bed avaiable.   Recommendations for Outpatient Follow-up:  1. Follow up with PCP in 1-2 weeks 2. Please obtain BMP/CBC in one week 3.   F/u within one week of discharge for check of left wrist and possible splint/ cast change by Dr handy.   Discharge Condition:STABLE.  CODE STATUS: DNR Diet recommendation: Heart Healthy  Brief/Interim Summary: 84 y.o.femalewith medical history significant ofsevere COPD, chronic hypoxic respiratory failure, GERD, hypertension, dyslipidemia and osteoporosis. Patient admitted on 11/17 after a fall. Found to have left superior and inferior pubic rami fracture, left distal radius fracture, seen by trauma surgery, orthopedics. Urine analysis negative for infection. Chest radiograph with significant hyperinflation withpositive lung markings bilaterally. Forearm x-rays with comminuted, impacted and displaced fracture of the distal radial metaphysis. Displaced fracture of the distal ulnar metaphysis. Cortical disruption at the level of the left acetabulum, raising concern about minimal displaced fracture.  Discharge Diagnoses:  Active Problems:   Left wrist fracture   Wrist fracture  1.Acute leftwrist fracture, displaced distal radial and ulnar metaphysis. Patient   Admitted for observation , continue paincontrol with acetaminophen .  Follow  orthopedic hand surgery consultation and physical therapy recommendations. For Inferior pubic rami fracture, orthopedics recommends weightbearing as tolerated, will require platform walker due to her left distal radius fracture. For  left distal radius fracture ,  Dr Marcelino Scot saw the patient and recommendations given.  Minimize narcotic medications as the patient was easily oversedated by  them. Use only  Tylenol   2.Hypertension Well controlled. Resume home meds.    3.Severe COPD with reactive leukocytosis. No acute exacerbation, continue bronchodilator with with desonide/formoterol, DuoNeb and tiotropium.Patient recently on systemic steroids,presented with leukocytosis on admission, no pneumonia.  Leukocytosis improving. Repeat chest x-rays shows chronic interstitial prominence, no pneumonia or CHF. Recommend checking cbc in one week to evaluate resolution of leukocytosis.   4.Depression. Continue mirtazapine  Discharge Instructions  Discharge Instructions    Diet - low sodium heart healthy   Complete by:  As directed    Diet - low sodium heart healthy   Complete by:  As directed    Discharge instructions   Complete by:  As directed    Please follow up with Dr Marcelino Scot as recommended.  Please follow up with PCP in one week.   Increase activity slowly   Complete by:  As directed      Allergies as of 10/29/2018      Reactions   Sulfa Antibiotics Nausea Only      Medication List    STOP taking these medications   ipratropium 17 MCG/ACT inhaler Commonly known as:  ATROVENT HFA   predniSONE 20 MG tablet Commonly known as:  DELTASONE     TAKE these medications   acetaminophen 500 MG tablet Commonly known as:  TYLENOL Take 500 mg by mouth every 6 (six) hours as needed for mild pain.   amLODipine 5 MG tablet Commonly known as:  NORVASC Take 1 tablet (5 mg total) daily by mouth.   budesonide-formoterol 160-4.5 MCG/ACT inhaler Commonly known as:  SYMBICORT Inhale 2 puffs into the lungs 2 (two) times daily.   docusate sodium 100 MG capsule Commonly known as:  COLACE Take 1 capsule (100 mg total) by mouth 2 (two) times daily.   furosemide 20 MG tablet Commonly known as:  LASIX  Take 20 mg by mouth daily.   ipratropium-albuterol 0.5-2.5 (3) MG/3ML Soln Commonly known as:  DUONEB Take 3 mLs by nebulization 3 (three) times daily. What changed:    when  to take this  reasons to take this   mirtazapine 15 MG tablet Commonly known as:  REMERON Take 7.5 mg by mouth at bedtime.   tiotropium 18 MCG inhalation capsule Commonly known as:  SPIRIVA Place 1 capsule (18 mcg total) into inhaler and inhale daily.       Contact information for follow-up providers    Kuneff, Renee A, DO Follow up in 1 week(s).   Specialty:  Family Medicine Contact information: [address] [place] [address] [phone]        Altamese [place]ville, MD Follow up in 1 week(s).   Specialty:  Orthopedic Surgery Contact information: [place] [address] [phone]            Contact information for after-discharge care    [facility] .   Service:  Skilled Nursing Contact information: [place] [address] [phone]                 Allergies  Allergen Reactions  . Sulfa Antibiotics Nausea Only    Consultations:  Orthopedics Dr Marcelino Scot.   Procedures/Studies: Dg Chest 1 View  Result Date: 10/21/2018 CLINICAL DATA:  Status post fall. Concern for chest injury. Initial encounter. EXAM: CHEST  1 VIEW COMPARISON:  Chest radiograph performed 07/20/2018 FINDINGS: The lungs are hyperexpanded, with flattening of the hemidiaphragms, compatible with COPD. Chronic peribronchial thickening is noted. There is no evidence of focal opacification, pleural effusion or pneumothorax. The cardiomediastinal silhouette is within normal limits. No acute osseous abnormalities are seen. IMPRESSION: Findings of COPD. No acute cardiopulmonary process seen. No displaced rib fractures identified. Electronically Signed   By: Garald Balding M.D.   On: 10/21/2018 04:56   Dg Forearm Left  Result Date: 10/23/2018 CLINICAL DATA:  Distal radial fracture.  Patient splint. EXAM: LEFT FOREARM - 2 VIEW COMPARISON:  10/21/2018. FINDINGS: Patient splinted. Fractures of the distal radius and ulna again  noted. These are better aligned than on prior exam. Some angulation and displacement still noted. Diffuse osteopenia. IMPRESSION: Patient splinted. Fractures of the distal radius and ulna again noted. These are better aligned than on prior exam. Some angulation and displacement still noted. Electronically Signed   By: Marcello Moores  Register   On: 10/23/2018 11:18   Dg Forearm Left  Result Date: 10/21/2018 CLINICAL DATA:  Status post fall, with left wrist pain. Initial encounter. EXAM: LEFT FOREARM - 2 VIEW COMPARISON:  None. FINDINGS: There is a comminuted, impacted and displaced fracture of the distal radial metaphysis, better characterized on concurrent wrist radiographs. There is also a displaced fracture of the distal ulnar metaphysis. The proximal radius and ulna appear intact. No elbow joint effusion is seen. The carpal rows appear grossly intact. Alignment of the first carpometacarpal joint is not well assessed. IMPRESSION: 1. Comminuted, impacted and displaced fracture of the distal radial metaphysis, better characterized on concurrent wrist radiographs. 2. Displaced fracture of the distal ulnar metaphysis. 3. Alignment of the first carpometacarpal joint is not well assessed. Electronically Signed   By: Garald Balding M.D.   On: 10/21/2018 03:20   Dg Wrist 2 Views Left  Result Date: 10/23/2018 CLINICAL DATA:  Distal radial fracture.  Patient in a splint. EXAM: LEFT WRIST - 2 VIEW COMPARISON:  10/21/2018. FINDINGS: Patient is in a splint. Fractures of the distal radial and ulna are again noted. Better alignment noted than on prior exam. Some degree of displacement and posterior angulation noted. Diffuse osteopenia. IMPRESSION: The patient's pain. Distal radial and ulnar fractures are again noted. Better alignment noted than on prior exam. Some degree of displacement and posterior angulation noted. Electronically Signed   By: Marcello Moores  Register   On: 10/23/2018 11:19   Dg Wrist Complete Left  Result  Date: 10/21/2018 CLINICAL DATA:  Evaluate for reduction EXAM: LEFT WRIST - COMPLETE 3+ VIEW COMPARISON:  10/21/2018 at 0246 hours FINDINGS: Comminuted intra-articular distal radial/radial styloid fracture. Fracture fragments are in near anatomic alignment and position following reduction. Mild widening of the radiocarpal articulation. Mildly comminuted fracture of the ulnar styloid and distal ulna, in near anatomic alignment and position. Overlying cast obscures fine osseous detail. IMPRESSION: Improved alignment status post reduction of comminuted distal radial and ulnar fractures, as above. Electronically Signed   By: Julian Hy M.D.   On: 10/21/2018 07:30   Dg Wrist Complete Left  Result Date: 10/21/2018 CLINICAL DATA:  Status post fall, with left wrist pain, acute onset. Initial encounter. EXAM: LEFT WRIST - COMPLETE 3+ VIEW COMPARISON:  None. FINDINGS: There is a comminuted, impacted and significantly dorsally displaced fracture of the distal radial metaphysis, with associated shortening. The carpal rows articulate with the distal fragments. There is also a displaced fracture of the distal ulnar metaphysis. The carpal rows appear grossly intact. The first carpometacarpal joint is difficult to fully assess, though it appears in grossly normal alignment on the cross-table view. Soft tissue swelling is noted about the wrist. IMPRESSION: 1. Comminuted, impacted and significantly dorsally displaced fracture of the distal radial metaphysis, with associated shortening. 2. Displaced fracture of the distal ulnar metaphysis. Electronically Signed   By: Garald Balding M.D.   On: 10/21/2018 03:22   Dg Pelvis Comp Min 3v  Result Date: 10/21/2018 CLINICAL DATA:  Acetabular fracture. EXAM: JUDET PELVIS - 3+ VIEW COMPARISON:  Left hip x-rays from same day. FINDINGS: Unchanged minimally displaced fracture of the left puboacetabular junction. Minimally displaced fracture of the left inferior pubic ramus. The  hip joint spaces are preserved. The pubic symphysis and sacroiliac joints are intact. Osteopenia. Soft tissues are unremarkable. IMPRESSION: 1. Minimally displaced fracture of the left puboacetabular junction without clear extension into the acetabulum. 2. Minimally displaced fracture of the left inferior pubic ramus. Electronically Signed   By: Titus Dubin M.D.   On: 10/21/2018 11:15   Dg Chest Port 1 View  Result Date: 10/23/2018 CLINICAL DATA:  Shortness of breath. History of emphysema, former smoker. EXAM: PORTABLE CHEST 1 VIEW COMPARISON:  Portable chest x-ray of October 21, 2018 FINDINGS: The lungs remain hyperinflated. The hemidiaphragms are flattened. The interstitial markings are coarse. There is no pleural effusion or pneumothorax. The heart and pulmonary vascularity are normal. There is calcification in the wall of the thoracic aorta. The observed bony thorax is unremarkable. IMPRESSION: COPD. Chronic interstitial prominence. No pneumonia, CHF, nor other acute cardiopulmonary abnormality. Electronically Signed   By: David  Martinique M.D.   On: 10/23/2018 11:01   Dg Hip Unilat W Or Wo Pelvis 2-3 Views Left  Result Date: 10/21/2018 CLINICAL DATA:  Status post fall, with left hip pain. Initial encounter. EXAM: DG HIP (WITH OR WITHOUT PELVIS) 2-3V LEFT COMPARISON:  None. FINDINGS: Cortical disruption at the level of the left acetabulum raises concern for a minimally displaced fracture. Would correlate for  associated symptoms. Both femoral heads are seated normally within their respective acetabula. The proximal left femur appears intact. No significant degenerative change is appreciated. The sacroiliac joints are unremarkable in appearance. The visualized bowel gas pattern is grossly unremarkable in appearance. Diffuse vascular calcifications are seen. IMPRESSION: 1. Cortical disruption at the level of the left acetabulum raises concern for a minimally displaced fracture. Would correlate for  associated symptoms. 2. Diffuse vascular calcifications seen. Electronically Signed   By: Garald Balding M.D.   On: 10/21/2018 03:24      Subjective: No pain at this time, no chest pain or sob. No nausea or vomiting.   Discharge Exam: Vitals:   10/29/18 0505 10/29/18 0831  BP: 130/73   Pulse: (!) 105   Resp:    Temp: 98.3 F (36.8 C)   SpO2: 98% 97%   Vitals:   10/28/18 2013 10/28/18 2018 10/29/18 0505 10/29/18 0831  BP: 132/62  130/73   Pulse: (!) 107  (!) 105   Resp: 17     Temp: 98 F (36.7 C)  98.3 F (36.8 C)   TempSrc: Oral  Oral   SpO2: 100% 100% 98% 97%  Weight:      Height:        General: Pt is alert, awake, not in acute distress Cardiovascular: RRR, S1/S2 +, no rubs, no gallops Respiratory: CTA bilaterally, no wheezing, no rhonchi Abdominal: Soft, NT, ND, bowel sounds + Extremities: no pedal edema.     The results of significant diagnostics from this hospitalization (including imaging, microbiology, ancillary and laboratory) are listed below for reference.     Microbiology: Recent Results (from the past 240 hour(s))  MRSA PCR Screening     Status: None   Collection Time: 10/21/18  5:30 PM  Result Value Ref Range Status   MRSA by PCR NEGATIVE NEGATIVE Final    Comment:        The GeneXpert MRSA Assay (FDA approved for NASAL specimens only), is one component of a comprehensive MRSA colonization surveillance program. It is not intended to diagnose MRSA infection nor to guide or monitor treatment for MRSA infections. Performed at [facility], [place] [address] [place], [place] [address]      Labs: BNP (last 3 results) Recent Labs    11/09/17 1511 06/04/18 1358 07/04/18 0014  BNP 10.4 24.4 12.7   Basic Metabolic Panel: Recent Labs  Lab 10/24/18 0312 10/28/18 0411  NA 136  --   K 4.1  --   CL 93*  --   CO2 31  --   GLUCOSE 94  --   BUN 14  --   CREATININE 0.61 0.57  CALCIUM 8.7*  --    Liver Function Tests: Recent Labs   Lab 10/24/18 0312  AST 16  ALT 16  ALKPHOS 68  BILITOT 1.9*  PROT 5.5*  ALBUMIN 2.6*   No results for input(s): LIPASE, AMYLASE in the last 168 hours. No results for input(s): AMMONIA in the last 168 hours. CBC: Recent Labs  Lab 10/25/18 0209 10/26/18 0201 10/27/18 0341 10/28/18 0411 10/29/18 0919  WBC 13.1* 10.8* 9.9 10.1 8.1  NEUTROABS 10.8* 8.5* 8.1* 8.2* 6.4  HGB 9.9* 10.0* 10.2* 10.3* 10.1*  HCT 33.4* 34.5* 33.7* 34.8* 33.7*  MCV 97.1 98.6 97.7 97.2 97.4  PLT 271 264 242 292 328   Cardiac Enzymes: No results for input(s): CKTOTAL, CKMB, CKMBINDEX, TROPONINI in the last 168 hours. BNP: Invalid input(s): POCBNP CBG: No results for input(s):  GLUCAP in the last 168 hours. D-Dimer No results for input(s): DDIMER in the last 72 hours. Hgb A1c No results for input(s): HGBA1C in the last 72 hours. Lipid Profile No results for input(s): CHOL, HDL, LDLCALC, TRIG, CHOLHDL, LDLDIRECT in the last 72 hours. Thyroid function studies No results for input(s): TSH, T4TOTAL, T3FREE, THYROIDAB in the last 72 hours.  Invalid input(s): FREET3 Anemia work up No results for input(s): VITAMINB12, FOLATE, FERRITIN, TIBC, IRON, RETICCTPCT in the last 72 hours. Urinalysis    Component Value Date/Time   COLORURINE YELLOW 10/21/2018 [place] 10/21/2018 0647   LABSPEC 1.017 10/21/2018 0647   PHURINE 6.0 10/21/2018 0647   GLUCOSEU NEGATIVE 10/21/2018 0647   HGBUR NEGATIVE 10/21/2018 [place] NEGATIVE 10/21/2018 [place] 10/21/2018 0647   PROTEINUR NEGATIVE 10/21/2018 0647   NITRITE NEGATIVE 10/21/2018 0647   LEUKOCYTESUR NEGATIVE 10/21/2018 0647   Sepsis Labs Invalid input(s): PROCALCITONIN,  WBC,  LACTICIDVEN Microbiology Recent Results (from the past 240 hour(s))  MRSA PCR Screening     Status: None   Collection Time: 10/21/18  5:30 PM  Result Value Ref Range Status   MRSA by PCR NEGATIVE NEGATIVE Final    Comment:        The  GeneXpert MRSA Assay (FDA approved for NASAL specimens only), is one component of a comprehensive MRSA colonization surveillance program. It is not intended to diagnose MRSA infection nor to guide or monitor treatment for MRSA infections. Performed at [facility], [place] [address] [place], [place] [address]     Mercy Riding, MD  [facility] 10/29/2018, 2:00 PM Pager   If 7PM-7AM, please contact night-coverage [email or web address] Password [email or web address]   Pt seen and examined at bedside.  No change in exam and we currently awaiting bed at SNF.    Hosie Poisson, MD [phone]

## 2018-10-29 NOTE — Progress Notes (Signed)
Report given to [facility] Loss adjuster, chartered) at [facility].  Written discharge packet will be sent via PTAR

## 2018-10-29 NOTE — Progress Notes (Signed)
Physical Therapy Treatment Patient Details Name: Sheila Mcconnell MRN: [id number] DOB: 1927-01-18 Today's Date: 10/29/2018    History of Present Illness Pt is 84 y.o. female presenting with left wrist fracture and left superior and inferior pubic rami fracture secondary to fall at ALF. PMH significant for medical history significant of severe COPD, chronic hypoxic respiratory failure, GERD, hypertension, dyslipidemia, dependence on oxygen, HOH, and osteoporosis.    PT Comments    Pt on BSC at start of treatment, reports she is discouraged to still be here and is eager to leave. Pt has limited tolerance to mobility, requiring minAx2 for stability during transfers and gait, but fatigues quickly upon standing. Pt was on 2 L supplemental oxygen via [place] for duration of treatment, see General Comments below for vitals at conclusion of treatment. DC plan remains appropriate at pt would benefit from skilled therapy to improve strength and endurance for safer mobility. PT will continue to follow acutely.   Follow Up Recommendations  SNF;Supervision/Assistance - 24 hour     Equipment Recommendations  Other (comment)(tbd at next venue)       Precautions / Restrictions Precautions Precautions: Fall Required Braces or Orthoses: Other Brace/Splint Other Brace/Splint: left wrist splint Restrictions Weight Bearing Restrictions: Yes LUE Weight Bearing: Weight bearing as tolerated LLE Weight Bearing: Weight bearing as tolerated    Mobility   Transfers Overall transfer level: Needs assistance Equipment used: Left platform walker Transfers: Sit to/from Stand Sit to Stand: Min assist;+2 physical assistance         General transfer comment: minAx2 for steadying during sit to stand from [facility]. Pt able to hold herself up with legs and left elbow on plaftorm walker while using right UE to perform peri care.    Ambulation/Gait Ambulation/Gait assistance: Min assist;+2 physical assistance;+2  safety/equipment Gait Distance (Feet): 2 Feet Assistive device: Left platform walker Gait Pattern/deviations: Shuffle;Decreased stride length;Antalgic Gait velocity: slowed Gait velocity interpretation: <1.31 ft/sec, indicative of household ambulator General Gait Details: minAx2 for steadying and management of platform walker to assist pt to chair from [facility]. Attempted to walk in room, pt reported fatigue and had 3/4 dyspnea, saying she needed to sit down.       Balance Overall balance assessment: Needs assistance Sitting-balance support: Single extremity supported;Feet supported Sitting balance-Leahy Scale: Fair Sitting balance - Comments: pt able to weight shift to accomplish self-care on BSC.   Standing balance support: Bilateral upper extremity supported Standing balance-Leahy Scale: Poor                              Cognition Arousal/Alertness: Awake/alert Behavior During Therapy: WFL for tasks assessed/performed;Flat affect Overall Cognitive Status: Impaired/Different from baseline Area of Impairment: Following commands                       Following Commands: Follows one step commands with increased time       General Comments: Pt flat today, reports she is ready to leave hospital.          General Comments General comments (skin integrity, edema, etc.): Pt's vitals were taken once seated in chair. HR elevated at 126 bpm and Oxygen saturation was 92%. Pt was on 2 L supplemental oxygen via [place] for duration of treatment, increased to 2.5 L once seated in chair. Oxygen saturation increased to 96% after 2 minutes of sitting in chair. HR was 120 bpm at end of session.  Pertinent Vitals/Pain Faces Pain Scale: Hurts little more Pain Location: left wrist with movement Pain Descriptors / Indicators: Grimacing;Discomfort;Tender;Guarding Pain Intervention(s): Limited activity within patient's tolerance;Monitored during session;Repositioned            PT Goals (current goals can now be found in the care plan section) Acute Rehab PT Goals Patient Stated Goal: "go home" (ALF) PT Goal Formulation: With patient Time For Goal Achievement: 11/05/18 Potential to Achieve Goals: Fair Progress towards PT goals: Not progressing toward goals - comment(pt has increased fatigue limiting ability to participate)    Frequency    Min 3X/week      PT Plan Current plan remains appropriate       AM-PAC PT "6 Clicks" Mobility   Outcome Measure  Help needed turning from your back to your side while in a flat bed without using bedrails?: A Lot Help needed moving from lying on your back to sitting on the side of a flat bed without using bedrails?: A Lot Help needed moving to and from a bed to a chair (including a wheelchair)?: A Lot Help needed standing up from a chair using your arms (e.g., wheelchair or bedside chair)?: A Little Help needed to walk in hospital room?: A Lot Help needed climbing 3-5 steps with a railing? : Total 6 Click Score: 12    End of Session Equipment Utilized During Treatment: Gait belt;Oxygen Activity Tolerance: Patient limited by fatigue Patient left: in chair;with call bell/phone within reach;with chair alarm set Nurse Communication: Mobility status;Other (comment)(vitals ) PT Visit Diagnosis: Unsteadiness on feet (R26.81);History of falling (Z91.81);Muscle weakness (generalized) (M62.81);Other abnormalities of gait and mobility (R26.89);Pain Pain - Right/Left: Left Pain - part of body: Arm;Hand;Leg     Time: 3838-1840 PT Time Calculation (min) (ACUTE ONLY): 18 min  Charges:  $Therapeutic Activity: 8-22 mins                     Vernell Morgans, SPT Acute Rehabilitation Services Office [phone]    Vernell Morgans 10/29/2018, 1:10 PM

## 2018-10-29 NOTE — Clinical Social Work Placement (Signed)
   CLINICAL SOCIAL WORK PLACEMENT  NOTE  Date:  10/29/2018  Patient Details  Name: Sheila Mcconnell MRN: [id number] Date of Birth: [birth year]  Clinical Social Work is seeking post-discharge placement for this patient at the [place] level of care (*CSW will initial, date and re-position this form in  chart as items are completed):  Yes   Patient/family provided with [facility] Work Department's list of facilities offering this level of care within the geographic area requested by the patient (or if unable, by the patient's family).  Yes   Patient/family informed of their freedom to choose among providers that offer the needed level of care, that participate in Medicare, Medicaid or managed care program needed by the patient, have an available bed and are willing to accept the patient.      Patient/family informed of [facility]'s ownership interest in [facility] and [facility], as well as of the fact that they are under no obligation to receive care at these facilities.  PASRR submitted to EDS on       PASRR number received on 10/24/18     Existing PASRR number confirmed on       FL2 transmitted to all facilities in geographic area requested by pt/family on 10/24/18     FL2 transmitted to all facilities within larger geographic area on       Patient informed that his/her managed care company has contracts with or will negotiate with certain facilities, including the following:        Yes   Patient/family informed of bed offers received.  Patient chooses bed at [facility]     Physician recommends and patient chooses bed at      Patient to be transferred to [facility] on 10/29/18.  Patient to be transferred to facility by [facility]     Patient family notified on 10/29/18 of transfer.  Name of family member notified:  [place] Please sign DNR     Additional Comment:     _______________________________________________ Alberteen Sam, LCSW 10/29/2018, 1:01 PM

## 2018-10-30 ENCOUNTER — Telehealth: Payer: Self-pay

## 2018-10-30 NOTE — Telephone Encounter (Signed)
Spoke to [place], Ms Cienfuegos's daughter, explained to her that Ms Orebaugh would need to discuss new referral with MD that set up Rehab. [name] stated that she had fractured wrist and hip and did not see an orthopedic physician. She was instructed to either talk to physician that set rehab or schedule an appointment (30 Min) with Dr. Raoul Pitch. [name] said okay and hung up the phone. Dr. Raoul Pitch made aware.

## 2018-10-30 NOTE — Telephone Encounter (Signed)
Copied from [place] [id number]. Topic: Referral - Request for Referral >> Oct 30, 2018  9:50 AM Sheila Mcconnell wrote: Has patient seen PCP for this complaint? No - pt was hospitalized after a fall and released to skilled nursing ([facility] in [place]) *If NO, is insurance requiring patient see PCP for this issue before PCP can refer them? Referral for which specialty: home health Preferred provider/office: unknown Reason for referral: Pt's daughter, Sheila Mcconnell, calling in.  States that pt was hospitalized after a fall and they aren't happy with Rehab facility she is in.  Pt's daughter is wanting to know if they can get a referral and orders for home care.  Pt would be going to live with daughter in [place] until she is well enough to return to assisted living (where she did reside: [name] in [place]). Sheila Mcconnell can be reached at [phone]

## 2018-11-15 ENCOUNTER — Ambulatory Visit: Payer: Medicare HMO | Admitting: Adult Health

## 2018-11-23 ENCOUNTER — Other Ambulatory Visit: Payer: Self-pay

## 2018-11-23 ENCOUNTER — Emergency Department (HOSPITAL_COMMUNITY)
Admission: EM | Admit: 2018-11-23 | Discharge: 2018-11-24 | Disposition: A | Discharge status: Home / Self Care | Payer: Medicare HMO | Attending: Emergency Medicine | Admitting: Emergency Medicine

## 2018-11-23 ENCOUNTER — Encounter (HOSPITAL_COMMUNITY): Payer: Self-pay

## 2018-11-23 ENCOUNTER — Emergency Department (HOSPITAL_COMMUNITY): Payer: Medicare HMO

## 2018-11-23 DIAGNOSIS — Z87891 Personal history of nicotine dependence: Secondary | ICD-10-CM | POA: Diagnosis not present

## 2018-11-23 DIAGNOSIS — Z4789 Encounter for other orthopedic aftercare: Secondary | ICD-10-CM | POA: Insufficient documentation

## 2018-11-23 DIAGNOSIS — I10 Essential (primary) hypertension: Secondary | ICD-10-CM | POA: Insufficient documentation

## 2018-11-23 DIAGNOSIS — M79602 Pain in left arm: Secondary | ICD-10-CM | POA: Insufficient documentation

## 2018-11-23 DIAGNOSIS — Z79899 Other long term (current) drug therapy: Secondary | ICD-10-CM | POA: Diagnosis not present

## 2018-11-23 NOTE — ED Triage Notes (Signed)
Pt arrived by [facility] due to her cast on her left forearm being too tight. Per EMS, pt has discomfort in her left forearm and has tingling in her fingers. Per pt, cast was applied on 11/20/18 by Dr. Marcelino Scot.

## 2018-11-23 NOTE — ED Provider Notes (Signed)
[facility] DEPT Provider Note   CSN: [id number] Arrival date & time: 11/23/18  2242     History   Chief Complaint Chief Complaint  Patient presents with  . Arm Pain    Cast is too tight     HPI Sheila Mcconnell is a 84 y.o. female.  Patient presents to the emergency department with a chief complaint of left arm pain.  She states that her cast is too tight.  She was having discomfort and tingling into her fingers.  She also reports some edema around her left elbow.  She has never had her elbow imaged.  States that she had a new cast put on 2 days ago.  She denies any other associated symptoms.  The history is provided by the patient. No language interpreter was used.    Past Medical History:  Diagnosis Date  . Allergy   . Cataracts, bilateral   . Dependence on supplemental oxygen    3.5l (pts stated 2.5 LNC), [facility]  . Emphysema of lung ([place])   . GERD (gastroesophageal reflux disease)   . Hyperlipidemia   . Hypertension   . Osteoporosis    had been on Boniva    Patient Active Problem List   Diagnosis Date Noted  . Left wrist fracture 10/21/2018  . Wrist fracture 10/21/2018  . Malnutrition of moderate degree 07/05/2018  . Goals of care, counseling/discussion   . Palliative care encounter   . Emphysema of lung ([place]) 07/04/2018  . Dyspnea on exertion 07/04/2018  . Chronic hypercapnic respiratory failure ([place]) 07/04/2018  . Emphysema lung ([place]) 06/27/2017  . Oxygen dependent 06/27/2017  . Hypertension 06/27/2017    Past Surgical History:  Procedure Laterality Date  . NO PAST SURGERIES       OB History    Gravida  4   Para  4   Term      Preterm      AB      Living  4     SAB      TAB      Ectopic      Multiple      Live Births               Home Medications    Prior to Admission medications   Medication Sig Start Date End Date Taking? Authorizing Provider  budesonide-formoterol (SYMBICORT)  160-4.5 MCG/ACT inhaler Inhale 2 puffs into the lungs 2 (two) times daily. 07/06/18  Yes Shelly Coss, MD  mirtazapine (REMERON) 15 MG tablet Take 7.5 mg by mouth at bedtime.   Yes [provider]  tiotropium (SPIRIVA) 18 MCG inhalation capsule Place 1 capsule (18 mcg total) into inhaler and inhale daily. 07/06/18  Yes Shelly Coss, MD  acetaminophen (TYLENOL) 500 MG tablet Take 500 mg by mouth every 6 (six) hours as needed for mild pain.    [provider]  amLODipine (NORVASC) 5 MG tablet Take 1 tablet (5 mg total) daily by mouth. 10/16/17   Kuneff, Renee A, DO  docusate sodium (COLACE) 100 MG capsule Take 1 capsule (100 mg total) by mouth 2 (two) times daily. 10/24/18   Hosie Poisson, MD  furosemide (LASIX) 20 MG tablet Take 20 mg by mouth daily.    [provider]  ipratropium-albuterol (DUONEB) 0.5-2.5 (3) MG/3ML SOLN Take 3 mLs by nebulization 3 (three) times daily. Patient taking differently: Take 3 mLs by nebulization 3 (three) times daily as needed (for SOB).  07/06/18  Shelly Coss, MD    Family History Family History  Problem Relation Age of Onset  . Diabetes Mother   . Hearing loss Mother   . Lymphoma Mother     Social History Social History   Tobacco Use  . Smoking status: Former Smoker    Packs/day: 1.00    Years: 50.00    Pack years: 50.00    Types: Cigarettes  . Smokeless tobacco: Never Used  Substance Use Topics  . Alcohol use: Yes    Alcohol/week: 2.0 standard drinks    Types: 2 Glasses of wine per week  . Drug use: No     Allergies   Sulfa antibiotics   Review of Systems Review of Systems  All other systems reviewed and are negative.    Physical Exam Updated Vital Signs BP 119/71 (BP Location: Right Arm)   Pulse 83   Temp (!) 97.5 F (36.4 C) (Oral)   Resp 18   Ht 5\' 5"  (1.651 m)   Wt 44 kg   SpO2 97%   BMI 16.14 kg/m   Physical Exam Vitals signs and nursing note reviewed.  Constitutional:      General:  She is not in acute distress.    Appearance: She is not diaphoretic.  HENT:     Head: Normocephalic and atraumatic.  Eyes:     Conjunctiva/sclera: Conjunctivae normal.     Pupils: Pupils are equal, round, and reactive to light.  Neck:     Trachea: No tracheal deviation.  Cardiovascular:     Rate and Rhythm: Normal rate.  Pulmonary:     Effort: Pulmonary effort is normal. No respiratory distress.  Abdominal:     Palpations: Abdomen is soft.  Musculoskeletal: Normal range of motion.     Comments: Moderate edema about the medial left elbow  Left wrist in cast  Skin:    General: Skin is warm and dry.  Neurological:     Mental Status: She is alert and oriented to person, place, and time.     Comments: Sensation intact to left fingers  Psychiatric:        Judgment: Judgment normal.      ED Treatments / Results  Labs (all labs ordered are listed, but only abnormal results are displayed) Labs Reviewed - No data to display  EKG None  Radiology No results found.  Procedures .Splint Application Date/Time: 56/43/3295 1:12 AM Performed by: Montine Circle, PA-C Authorized by: Montine Circle, PA-C   Consent:    Consent obtained:  Verbal   Consent given by:  Patient   Risks discussed:  Swelling and pain   Alternatives discussed:  Referral Pre-procedure details:    Sensation:  Normal Procedure details:    Laterality:  Left   Location:  Wrist   Wrist:  L wrist   Cast type:  Short arm   Splint type:  Sugar tong   Supplies:  Ortho-Gl[facility] Post-procedure details:    Pain:  Improved   Sensation:  Normal   Patient tolerance of procedure:  Tolerated well, no immediate complications   (including critical care time)  Medications Ordered in ED Medications - No data to display   Initial Impression / Assessment and Plan / ED Course  I have reviewed the triage vital signs and the nursing notes.  Pertinent labs & imaging results that were available during my care of the  patient were reviewed by me and considered in my medical decision making (see chart for details).    Patient  sent to the emergency department after calling her orthopedic office due to pain after a new cast was placed.  She believes it to be too tight.  Reports having some tingling in her fingers.  Distal pulses intact.  Brisk capillary refill.  Patient feels improved after cast removal and splinting in the ED.  Recommend following up with her orthopedic surgeon.  Patient seen by and discussed with Dr. Christy Gentles, who agrees with the plan.  Final Clinical Impressions(s) / ED Diagnoses   Final diagnoses:  Problem with fiberglass cast  Left arm pain    ED Discharge Orders    None       Montine Circle, PA-C 11/24/18 0113    Ripley Fraise, MD 11/24/18 [phone]

## 2018-11-23 NOTE — ED Notes (Signed)
Bed: KS13 Expected date:  Expected time:  Means of arrival:  Comments: EMS 84 yo female cast too tight on arm

## 2018-11-23 NOTE — ED Notes (Signed)
Ortho from [facility] contacted.

## 2018-11-24 NOTE — ED Provider Notes (Signed)
Patient seen/examined in the Emergency Department in conjunction with Midlevel Provider  Patient reports feeling the cast is too tight on her left arm Exam : Awake alert, no acute distress.  She can move her fingers, left fingers are warm to touch Plan: Cast was exchanged for a splint.  Patient tolerated well will discharge   Ripley Fraise, MD 11/24/18 0210

## 2018-11-24 NOTE — ED Notes (Signed)
[facility] contacted for transport 

## 2018-11-24 NOTE — Discharge Instructions (Addendum)
You need to call Dr. Carlean Jews office to have a new cast put on.  Please return to the ER for new or worsening symptoms.

## 2018-11-27 ENCOUNTER — Other Ambulatory Visit: Payer: Self-pay

## 2018-11-27 ENCOUNTER — Inpatient Hospital Stay (HOSPITAL_COMMUNITY)
Admission: EM | Admit: 2018-11-27 | Discharge: 2018-11-30 | DRG: 536 | Disposition: A | Discharge status: Skilled Nursing Facility | Payer: Medicare Other | Source: Skilled Nursing Facility | Attending: Internal Medicine | Admitting: Internal Medicine

## 2018-11-27 ENCOUNTER — Emergency Department (HOSPITAL_COMMUNITY): Payer: Medicare Other

## 2018-11-27 ENCOUNTER — Encounter (HOSPITAL_COMMUNITY): Payer: Self-pay

## 2018-11-27 DIAGNOSIS — Y92091 Bathroom in other non-institutional residence as the place of occurrence of the external cause: Secondary | ICD-10-CM

## 2018-11-27 DIAGNOSIS — E8889 Other specified metabolic disorders: Secondary | ICD-10-CM | POA: Diagnosis not present

## 2018-11-27 DIAGNOSIS — J449 Chronic obstructive pulmonary disease, unspecified: Secondary | ICD-10-CM | POA: Diagnosis not present

## 2018-11-27 DIAGNOSIS — Y9301 Activity, walking, marching and hiking: Secondary | ICD-10-CM | POA: Diagnosis not present

## 2018-11-27 DIAGNOSIS — D72829 Elevated white blood cell count, unspecified: Secondary | ICD-10-CM | POA: Diagnosis not present

## 2018-11-27 DIAGNOSIS — F0390 Unspecified dementia without behavioral disturbance: Secondary | ICD-10-CM | POA: Diagnosis not present

## 2018-11-27 DIAGNOSIS — W010XXA Fall on same level from slipping, tripping and stumbling without subsequent striking against object, initial encounter: Secondary | ICD-10-CM | POA: Diagnosis not present

## 2018-11-27 DIAGNOSIS — E559 Vitamin D deficiency, unspecified: Secondary | ICD-10-CM | POA: Diagnosis not present

## 2018-11-27 DIAGNOSIS — Z9981 Dependence on supplemental oxygen: Secondary | ICD-10-CM | POA: Diagnosis not present

## 2018-11-27 DIAGNOSIS — M810 Age-related osteoporosis without current pathological fracture: Secondary | ICD-10-CM | POA: Diagnosis not present

## 2018-11-27 DIAGNOSIS — S32309A Unspecified fracture of unspecified ilium, initial encounter for closed fracture: Secondary | ICD-10-CM | POA: Diagnosis not present

## 2018-11-27 DIAGNOSIS — S32302A Unspecified fracture of left ilium, initial encounter for closed fracture: Secondary | ICD-10-CM | POA: Diagnosis present

## 2018-11-27 DIAGNOSIS — Z66 Do not resuscitate: Secondary | ICD-10-CM | POA: Diagnosis not present

## 2018-11-27 DIAGNOSIS — I10 Essential (primary) hypertension: Secondary | ICD-10-CM | POA: Diagnosis not present

## 2018-11-27 DIAGNOSIS — S32810A Multiple fractures of pelvis with stable disruption of pelvic ring, initial encounter for closed fracture: Secondary | ICD-10-CM | POA: Diagnosis not present

## 2018-11-27 DIAGNOSIS — D62 Acute posthemorrhagic anemia: Secondary | ICD-10-CM | POA: Diagnosis not present

## 2018-11-27 DIAGNOSIS — E785 Hyperlipidemia, unspecified: Secondary | ICD-10-CM | POA: Diagnosis not present

## 2018-11-27 DIAGNOSIS — R296 Repeated falls: Secondary | ICD-10-CM | POA: Diagnosis present

## 2018-11-27 DIAGNOSIS — S329XXA Fracture of unspecified parts of lumbosacral spine and pelvis, initial encounter for closed fracture: Secondary | ICD-10-CM | POA: Diagnosis present

## 2018-11-27 DIAGNOSIS — Z87891 Personal history of nicotine dependence: Secondary | ICD-10-CM

## 2018-11-27 DIAGNOSIS — J439 Emphysema, unspecified: Secondary | ICD-10-CM | POA: Diagnosis not present

## 2018-11-27 DIAGNOSIS — D638 Anemia in other chronic diseases classified elsewhere: Secondary | ICD-10-CM | POA: Diagnosis present

## 2018-11-27 DIAGNOSIS — K219 Gastro-esophageal reflux disease without esophagitis: Secondary | ICD-10-CM | POA: Diagnosis not present

## 2018-11-27 LAB — CBC WITH DIFFERENTIAL/PLATELET
ABS IMMATURE GRANULOCYTES: 0.08 10*3/uL — AB (ref 0.00–0.07)
BASOS PCT: 0 %
Basophils Absolute: 0 10*3/uL (ref 0.0–0.1)
EOS ABS: 0.1 10*3/uL (ref 0.0–0.5)
Eosinophils Relative: 1 %
HCT: 37.4 % (ref 36.0–46.0)
Hemoglobin: 11.1 g/dL — ABNORMAL LOW (ref 12.0–15.0)
Immature Granulocytes: 1 %
Lymphocytes Relative: 5 %
Lymphs Abs: 0.8 10*3/uL (ref 0.7–4.0)
MCH: 29.8 pg (ref 26.0–34.0)
MCHC: 29.7 g/dL — ABNORMAL LOW (ref 30.0–36.0)
MCV: 100.5 fL — AB (ref 80.0–100.0)
MONO ABS: 1 10*3/uL (ref 0.1–1.0)
MONOS PCT: 6 %
NEUTROS ABS: 13.6 10*3/uL — AB (ref 1.7–7.7)
NEUTROS PCT: 87 %
PLATELETS: 314 10*3/uL (ref 150–400)
RBC: 3.72 MIL/uL — ABNORMAL LOW (ref 3.87–5.11)
RDW: 14.8 % (ref 11.5–15.5)
WBC: 15.5 10*3/uL — AB (ref 4.0–10.5)
nRBC: 0 % (ref 0.0–0.2)

## 2018-11-27 LAB — TYPE AND SCREEN
ABO/RH(D): O POS
Antibody Screen: NEGATIVE

## 2018-11-27 LAB — BASIC METABOLIC PANEL
ANION GAP: 9 (ref 5–15)
BUN: 17 mg/dL (ref 8–23)
CALCIUM: 9.1 mg/dL (ref 8.9–10.3)
CO2: 36 mmol/L — ABNORMAL HIGH (ref 22–32)
Chloride: 94 mmol/L — ABNORMAL LOW (ref 98–111)
Creatinine, Ser: 0.43 mg/dL — ABNORMAL LOW (ref 0.44–1.00)
GFR calc Af Amer: >60 mL/min (ref >60)
GLUCOSE: 133 mg/dL — AB (ref 70–99)
POTASSIUM: 4.1 mmol/L (ref 3.5–5.1)
SODIUM: 139 mmol/L (ref 135–145)

## 2018-11-27 MED ORDER — AMLODIPINE BESYLATE 5 MG PO TABS
5.0000 mg | ORAL_TABLET | Freq: Every day | ORAL | Status: DC
  Administered 2018-11-28 – 2018-11-30 (×3): 5 mg via ORAL
  Filled 2018-11-27 (×3): qty 1

## 2018-11-27 MED ORDER — FENTANYL CITRATE (PF) 100 MCG/2ML IJ SOLN
12.5000 ug | INTRAMUSCULAR | Status: DC | PRN
  Administered 2018-11-28 (×2): 12.5 ug via INTRAVENOUS
  Filled 2018-11-27 (×2): qty 2

## 2018-11-27 MED ORDER — TIOTROPIUM BROMIDE MONOHYDRATE 18 MCG IN CAPS
18.0000 ug | ORAL_CAPSULE | Freq: Every day | RESPIRATORY_TRACT | Status: DC
  Administered 2018-11-28 – 2018-11-30 (×3): 18 ug via RESPIRATORY_TRACT
  Filled 2018-11-27 (×2): qty 5

## 2018-11-27 MED ORDER — ONDANSETRON HCL 4 MG PO TABS: 4.0000 mg | ORAL_TABLET | Freq: Four times a day (QID) | ORAL | Status: DC | PRN

## 2018-11-27 MED ORDER — MIRTAZAPINE 15 MG PO TABS
7.5000 mg | ORAL_TABLET | Freq: Every day | ORAL | Status: DC
  Administered 2018-11-27 – 2018-11-29 (×3): 7.5 mg via ORAL
  Filled 2018-11-27 (×3): qty 1

## 2018-11-27 MED ORDER — FENTANYL CITRATE (PF) 100 MCG/2ML IJ SOLN
50.0000 ug | INTRAMUSCULAR | Status: AC | PRN
  Administered 2018-11-27 (×2): 50 ug via INTRAVENOUS
  Filled 2018-11-27 (×2): qty 2

## 2018-11-27 MED ORDER — OXYCODONE-ACETAMINOPHEN 5-325 MG PO TABS
1.0000 | ORAL_TABLET | Freq: Once | ORAL | Status: AC
  Administered 2018-11-27: 1 via ORAL
  Filled 2018-11-27: qty 1

## 2018-11-27 MED ORDER — ONDANSETRON HCL 4 MG/2ML IJ SOLN: 4.0000 mg | Freq: Four times a day (QID) | INTRAMUSCULAR | Status: DC | PRN

## 2018-11-27 MED ORDER — TRAMADOL HCL 50 MG PO TABS
25.0000 mg | ORAL_TABLET | Freq: Every day | ORAL | Status: DC | PRN
  Administered 2018-11-28 – 2018-11-30 (×5): 25 mg via ORAL
  Filled 2018-11-27 (×6): qty 1

## 2018-11-27 MED ORDER — ACETAMINOPHEN 325 MG PO TABS: 650.0000 mg | ORAL_TABLET | Freq: Four times a day (QID) | ORAL | Status: DC | PRN

## 2018-11-27 MED ORDER — DOCUSATE SODIUM 100 MG PO CAPS
100.0000 mg | ORAL_CAPSULE | Freq: Two times a day (BID) | ORAL | Status: DC
  Administered 2018-11-27 – 2018-11-30 (×6): 100 mg via ORAL
  Filled 2018-11-27 (×6): qty 1

## 2018-11-27 MED ORDER — ACETAMINOPHEN 650 MG RE SUPP: 650.0000 mg | Freq: Four times a day (QID) | RECTAL | Status: DC | PRN

## 2018-11-27 MED ORDER — MOMETASONE FURO-FORMOTEROL FUM 200-5 MCG/ACT IN AERO
2.0000 | INHALATION_SPRAY | Freq: Two times a day (BID) | RESPIRATORY_TRACT | Status: DC
  Administered 2018-11-28 – 2018-11-30 (×5): 2 via RESPIRATORY_TRACT
  Filled 2018-11-27: qty 8.8

## 2018-11-27 NOTE — ED Triage Notes (Signed)
EMS reports from [facility], fall, left hip pain, full range of motion on scene, no LOC, no blood thinners, no head trauma. Had fall two weeks ago with similar hip. Waiting on xray, unknown Dx.   BP 112/68 HR 96 Sp02 96 RA RR 24  50mg  Tramadol at facility prior to transport

## 2018-11-27 NOTE — ED Provider Notes (Signed)
[place] DEPT Provider Note   CSN: [id number] Arrival date & time: 11/27/18  1618     History   Chief Complaint Chief Complaint  Patient presents with  . Fall  . Hip Pain    HPI Sheila Mcconnell is a 84 y.o. female.  She is brought in by ambulance from her facility at [place] [facility] after a fall.  She does not recall why she fell.  She is complaining of pain in her left hip.  There was no loss of consciousness and she did not strike her head.  She is fallen a month ago and she broke her left distal radius and ulna and is still in a splint.  She denies any headache blurry vision chest pain shortness of breath abdominal pain numbness or weakness.  Level 5 caveat secondary to dementia.  The history is provided by the patient.  Fall  This is a recurrent problem. The current episode started 3 to 5 hours ago. The problem has not changed since onset.Pertinent negatives include no chest pain, no abdominal pain, no headaches and no shortness of breath. The symptoms are aggravated by bending. The symptoms are relieved by position. She has tried nothing for the symptoms. The treatment provided no relief.  Hip Pain  This is a new problem. The current episode started 3 to 5 hours ago. The problem occurs constantly. The problem has not changed since onset.Pertinent negatives include no chest pain, no abdominal pain, no headaches and no shortness of breath. The symptoms are aggravated by bending. The symptoms are relieved by position. She has tried nothing for the symptoms. The treatment provided no relief.    Past Medical History:  Diagnosis Date  . Allergy   . Cataracts, bilateral   . Dependence on supplemental oxygen    3.5l (pts stated 2.5 LNC), [facility]  . Emphysema of lung ([place])   . GERD (gastroesophageal reflux disease)   . Hyperlipidemia   . Hypertension   . Osteoporosis    had been on Boniva    Patient Active Problem List   Diagnosis Date  Noted  . Left wrist fracture 10/21/2018  . Wrist fracture 10/21/2018  . Malnutrition of moderate degree 07/05/2018  . Goals of care, counseling/discussion   . Palliative care encounter   . Emphysema of lung ([place]) 07/04/2018  . Dyspnea on exertion 07/04/2018  . Chronic hypercapnic respiratory failure ([place]) 07/04/2018  . Emphysema lung ([place]) 06/27/2017  . Oxygen dependent 06/27/2017  . Hypertension 06/27/2017    Past Surgical History:  Procedure Laterality Date  . NO PAST SURGERIES       OB History    Gravida  4   Para  4   Term      Preterm      AB      Living  4     SAB      TAB      Ectopic      Multiple      Live Births               Home Medications    Prior to Admission medications   Medication Sig Start Date End Date Taking? Authorizing Provider  amLODipine (NORVASC) 5 MG tablet Take 1 tablet (5 mg total) daily by mouth. 10/16/17   Kuneff, Renee A, DO  budesonide-formoterol (SYMBICORT) 160-4.5 MCG/ACT inhaler Inhale 2 puffs into the lungs 2 (two) times daily. 07/06/18   Shelly Coss, MD  docusate sodium (COLACE)  100 MG capsule Take 1 capsule (100 mg total) by mouth 2 (two) times daily. Patient not taking: Reported on 11/23/2018 10/24/18   Hosie Poisson, MD  ipratropium-albuterol (DUONEB) 0.5-2.5 (3) MG/3ML SOLN Take 3 mLs by nebulization 3 (three) times daily. Patient taking differently: Take 3 mLs by nebulization 3 (three) times daily as needed (for SOB).  07/06/18   Shelly Coss, MD  mirtazapine (REMERON) 15 MG tablet Take 7.5 mg by mouth at bedtime.    [provider]  tiotropium (SPIRIVA) 18 MCG inhalation capsule Place 1 capsule (18 mcg total) into inhaler and inhale daily. 07/06/18   Shelly Coss, MD    Family History Family History  Problem Relation Age of Onset  . Diabetes Mother   . Hearing loss Mother   . Lymphoma Mother     Social History Social History   Tobacco Use  . Smoking status: Former Smoker    Packs/day:  1.00    Years: 50.00    Pack years: 50.00    Types: Cigarettes  . Smokeless tobacco: Never Used  Substance Use Topics  . Alcohol use: Yes    Alcohol/week: 2.0 standard drinks    Types: 2 Glasses of wine per week  . Drug use: No     Allergies   Sulfa antibiotics   Review of Systems Review of Systems  Constitutional: Negative for fever.  HENT: Negative for sore throat.   Eyes: Negative for visual disturbance.  Respiratory: Negative for shortness of breath.   Cardiovascular: Negative for chest pain.  Gastrointestinal: Negative for abdominal pain.  Genitourinary: Negative for dysuria.  Musculoskeletal: Negative for neck pain.  Skin: Positive for wound (skin tears). Negative for rash.  Neurological: Negative for headaches.     Physical Exam Updated Vital Signs BP 112/68   Pulse 96   Temp (!) 97.5 F (36.4 C)   Resp 18   SpO2 96%   Physical Exam Vitals signs and nursing note reviewed.  Constitutional:      General: She is not in acute distress.    Appearance: She is well-developed.  HENT:     Head: Normocephalic and atraumatic.  Eyes:     Conjunctiva/sclera: Conjunctivae normal.  Neck:     Musculoskeletal: Neck supple.  Cardiovascular:     Rate and Rhythm: Normal rate and regular rhythm.     Heart sounds: No murmur.  Pulmonary:     Effort: Pulmonary effort is normal. No respiratory distress.     Breath sounds: Normal breath sounds.  Abdominal:     Palpations: Abdomen is soft.     Tenderness: There is no abdominal tenderness.  Musculoskeletal:        General: Tenderness and signs of injury present.     Right lower leg: No edema.     Left lower leg: No edema.     Comments: She has some skin tears over her right wrist and forearm.  Her left upper extremity is in a sugar tong splint.  She has immediate pain with any range of motion at her left hip.  She has some skin tears of her right lower leg.  Distal sensation and cap refill intact.  Skin:    General: Skin  is warm and dry.  Neurological:     Mental Status: She is alert. Mental status is at baseline. She is disoriented.     Sensory: No sensory deficit.      ED Treatments / Results  Labs (all labs ordered are listed, but only  abnormal results are displayed) Labs Reviewed  BASIC METABOLIC PANEL - Abnormal; Notable for the following components:      Result Value   Chloride 94 (*)    CO2 36 (*)    Glucose, Bld 133 (*)    Creatinine, Ser 0.43 (*)    All other components within normal limits  CBC WITH DIFFERENTIAL/PLATELET - Abnormal; Notable for the following components:   WBC 15.5 (*)    RBC 3.72 (*)    Hemoglobin 11.1 (*)    MCV 100.5 (*)    MCHC 29.7 (*)    Neutro Abs 13.6 (*)    Abs Immature Granulocytes 0.08 (*)    All other components within normal limits  BASIC METABOLIC PANEL  CBC  TYPE AND SCREEN  ABO/RH    EKG EKG Interpretation  Date/Time:  Tuesday November 27 2018 17:54:27 EST Ventricular Rate:  105 PR Interval:    QRS Duration: 109 QT Interval:  354 QTC Calculation: 468 R Axis:   122 Text Interpretation:  Sinus tachycardia Atrial premature complex Biatrial enlargement Right axis deviation RSR' in V1 or V2, probably normal variant Abnormal lateral Q waves Anteroseptal infarct, age indeterminate poor baseline, likely similar to prior 11/19 Confirmed by Aletta Edouard [phone]) on 11/27/2018 5:56:50 PM   Radiology Dg Chest 1 View  Result Date: 11/27/2018 CLINICAL DATA:  Fall.  Hip pain. EXAM: CHEST  1 VIEW COMPARISON:  10/23/2018 FINDINGS: Atherosclerotic calcification of the aortic arch. Heart size within normal limits. Emphysema. Old granulomatous disease. Mild chronic interstitial accentuation. The patient is rotated to the bright on today's radiograph, reducing diagnostic sensitivity and specificity. IMPRESSION: 1. No acute findings. 2. Aortic Atherosclerosis (ICD10-I70.0) and Emphysema (ICD10-J43.9). 3. Old granulomatous disease. Electronically Signed   By:  Van Clines M.D.   On: 11/27/2018 17:27   Ct Head Wo Contrast  Result Date: 11/27/2018 CLINICAL DATA:  Fall, head trauma EXAM: CT HEAD WITHOUT CONTRAST TECHNIQUE: Contiguous axial images were obtained from the base of the skull through the vertex without intravenous contrast. COMPARISON:  None. FINDINGS: Brain: Mild atrophy. Chronic white matter changes. Negative for acute infarct, hemorrhage, mass Vascular: Atherosclerotic calcification. Negative for hyperdense vessel Skull: Negative for fracture Sinuses/Orbits: Paranasal sinuses clear.  Bilateral eye surgery. Other: None IMPRESSION: Atrophy and chronic microvascular ischemia.  No acute abnormality. Electronically Signed   By: Franchot Gallo M.D.   On: 11/27/2018 17:42   Ct Pelvis Wo Contrast  Result Date: 11/27/2018 CLINICAL DATA:  Left hip pain after fall. EXAM: CT PELVIS WITHOUT CONTRAST TECHNIQUE: Multidetector CT imaging of the pelvis was performed following the standard protocol without intravenous contrast. COMPARISON:  Pelvic radiograph from 10/21/2018 FINDINGS: Urinary Tract: The included kidneys and ureters are unremarkable. The urinary bladder is physiologically distended without rupture. Bowel: Moderate stool retention within the colon. Sigmoid diverticulosis is identified without acute diverticulitis. Vascular/Lymphatic: Moderate aortoiliac and branch vessel atherosclerosis. Reproductive:  The uterus and adnexa are nonacute. Other: The partially included gallbladder containing a few layering stones. Musculoskeletal: There is an acute ventral and medially displaced fracture involving the ala and body of the left iliac bone. There is ventral displacement by approximately 11 mm of the main fracture fragment. The fracture extends to the cephalad margin the left SI joint, series 3/64. No pelvic diastasis. No involvement of the left acetabulum. Redemonstration of subacute left puboacetabular and inferior pubic rami fractures with evolving  callus. Degenerative joint space narrowing of both hips without dislocation. No joint effusion. Mild associated soft tissue induration compatible  with posttraumatic contusions along the lateral aspect of the left hip and pelvis. IMPRESSION: 1. Acute ventral and medially displaced fracture of the left iliac bone with ventral displacement by approximately 11 mm. 2. Subacute left puboacetabular and inferior pubic rami fractures with evolving callus. 3. Sigmoid diverticulosis without acute diverticulitis. 4. Aortoiliac and branch vessel atherosclerosis. Electronically Signed   By: Ashley Royalty M.D.   On: 11/27/2018 18:50   Dg Hip Unilat With Pelvis 2-3 Views Left  Result Date: 11/27/2018 CLINICAL DATA:  Left hip pain, fall. EXAM: DG HIP (WITH OR WITHOUT PELVIS) 2-3V LEFT COMPARISON:  10/21/2018 FINDINGS: Bony demineralization. Disrupted iliopubic line favoring a fracture of the acetabulum or the lateral margin of the superior pubic ramus. Atherosclerosis noted. Possible fracture of the inferior pubic ramus on the left. Possible fracture the left pubic body. Such fractures are often accompanied by fractures of the sacrum. IMPRESSION: 1. Left pelvic fracture with disruption of the iliopubic line and possible involvement of the pubic body and inferior ramus on the left. These are often accompanied by fractures of the sacrum. I recommend dedicated CT of the pelvis for definitive characterization. 2. Atherosclerosis. 3. Bony demineralization. Electronically Signed   By: Van Clines M.D.   On: 11/27/2018 17:25    Procedures Procedures (including critical care time)  Medications Ordered in ED Medications  traMADol (ULTRAM) tablet 25 mg (has no administration in time range)  amLODipine (NORVASC) tablet 5 mg (has no administration in time range)  mirtazapine (REMERON) tablet 7.5 mg (7.5 mg Oral Given 11/27/18 2225)  docusate sodium (COLACE) capsule 100 mg (100 mg Oral Given 11/27/18 2225)    mometasone-formoterol (DULERA) 200-5 MCG/ACT inhaler 2 puff (2 puffs Inhalation Not Given 11/27/18 2229)  tiotropium (SPIRIVA) inhalation capsule ([facility] use ONLY) 18 mcg (has no administration in time range)  acetaminophen (TYLENOL) tablet 650 mg (has no administration in time range)    Or  acetaminophen (TYLENOL) suppository 650 mg (has no administration in time range)  ondansetron (ZOFRAN) tablet 4 mg (has no administration in time range)    Or  ondansetron (ZOFRAN) injection 4 mg (has no administration in time range)  fentaNYL (SUBLIMAZE) injection 12.5 mcg (has no administration in time range)  fentaNYL (SUBLIMAZE) injection 50 mcg (50 mcg Intravenous Given 11/27/18 1841)  oxyCODONE-acetaminophen (PERCOCET/ROXICET) 5-325 MG per tablet 1 tablet (1 tablet Oral Given 11/27/18 2014)     Initial Impression / Assessment and Plan / ED Course  I have reviewed the triage vital signs and the nursing notes.  Pertinent labs & imaging results that were available during my care of the patient were reviewed by me and considered in my medical decision making (see chart for details).  Clinical Course as of Nov 28 2255  Tue Nov 27, 2018  1946 Discussed with Dr. Mardelle Matte from orthopedics who feels this is likely going to be nonoperative and she can be weightbearing as tolerated.  1 of his partners from his group will check on her tomorrow but she should be medically admitted.   [MB]  2002 Discussed with Dr. Hal Hope who will evaluate the patient for medical admission.   [MB]    Clinical Course User Index [MB] Hayden Rasmussen, MD    Final Clinical Impressions(s) / ED Diagnoses   Final diagnoses:  Closed displaced fracture of pelvis, unspecified part of pelvis, initial encounter [facility])    ED Discharge Orders    None       Hayden Rasmussen, MD 11/27/18 2257

## 2018-11-27 NOTE — ED Notes (Signed)
ED TO INPATIENT HANDOFF REPORT  Name/Age/Gender Sheila Mcconnell 84 y.o. female  Code Status Code Status History    Date Active Date Inactive Code Status Order ID Comments User Context   10/21/2018 1114 10/29/2018 2140 DNR [id number]  Tawni Millers, MD Inpatient   10/21/2018 0717 10/21/2018 1114 DNR [id number]  Merrily Pew, MD ED   07/05/2018 1611 07/06/2018 2042 DNR [id number]  Pershing Proud, NP Inpatient   07/04/2018 0252 07/05/2018 1611 Full Code [id number]  Tomma Rakers, MD ED    Questions for Most Recent Historical Code Status (Order [id number])    Question Answer Comment   In the event of cardiac or respiratory ARREST Do not call a "code blue"    In the event of cardiac or respiratory ARREST Do not perform Intubation, CPR, defibrillation or ACLS    In the event of cardiac or respiratory ARREST Use medication by any route, position, wound care, and other measures to relive pain and suffering. May use oxygen, suction and manual treatment of airway obstruction as needed for comfort.         Advance Directive Documentation     Most Recent Value  Type of Advance Directive  Living will  Pre-existing out of facility DNR order (yellow form or pink MOST form)  -  "MOST" Form in Place?  -      Home/SNF/Other Skilled nursing facility  Chief Complaint Fall; Hip Pain  Level of Care/Admitting Diagnosis ED Disposition    ED Disposition Condition [place]: [place] [[id number]]  Level of Care: Med-Surg [16]  Diagnosis: Pelvis fracture [facility]) [[id number]]  Admitting Physician: Rise Patience [phone]  Attending Physician: Rise Patience [phone]  Estimated length of stay: past midnight tomorrow  Certification:: I certify this patient will need inpatient services for at least 2 midnights  PT Class (Do Not Modify): Inpatient [101]  PT Acc Code (Do Not Modify): Private [1]       Medical History Past Medical History:   Diagnosis Date  . Allergy   . Cataracts, bilateral   . Dependence on supplemental oxygen    3.5l (pts stated 2.5 LNC), [facility] services  . Emphysema of lung ([place])   . GERD (gastroesophageal reflux disease)   . Hyperlipidemia   . Hypertension   . Osteoporosis    had been on Boniva    Allergies Allergies  Allergen Reactions  . Sulfa Antibiotics Nausea Only    IV Location/Drains/Wounds Patient Lines/Drains/Airways Status   Active Line/Drains/Airways    Name:   Placement date:   Placement time:   Site:   Days:   Peripheral IV 11/27/18 Right Antecubital   11/27/18    1722    Antecubital   less than 1   Wound / Incision (Open or Dehisced) 10/21/18 Other (Comment) Wrist Left   10/21/18    0701    Wrist   37          Labs/Imaging Results for orders placed or performed during the hospital encounter of 11/27/18 (from the past 48 hour(s))  Basic metabolic panel     Status: Abnormal   Collection Time: 11/27/18  5:16 PM  Result Value Ref Range   Sodium 139 135 - 145 mmol/L   Potassium 4.1 3.5 - 5.1 mmol/L   Chloride 94 (L) 98 - 111 mmol/L   CO2 36 (H) 22 - 32 mmol/L   Glucose, Bld 133 (H) 70 - 99 mg/dL   BUN 17  8 - 23 mg/dL   Creatinine, Ser 0.43 (L) 0.44 - 1.00 mg/dL   Calcium 9.1 8.9 - 10.3 mg/dL   GFR calc non Af Amer >60 >60 mL/min   GFR calc Af Amer >60 >60 mL/min   Anion gap 9 5 - 15    Comment: Performed at [facility], [place] [address] [place], [place] [address]  CBC WITH DIFFERENTIAL     Status: Abnormal   Collection Time: 11/27/18  5:16 PM  Result Value Ref Range   WBC 15.5 (H) 4.0 - 10.5 K/uL   RBC 3.72 (L) 3.87 - 5.11 MIL/uL   Hemoglobin 11.1 (L) 12.0 - 15.0 g/dL   HCT 37.4 36.0 - 46.0 %   MCV 100.5 (H) 80.0 - 100.0 fL   MCH 29.8 26.0 - 34.0 pg   MCHC 29.7 (L) 30.0 - 36.0 g/dL   RDW 14.8 11.5 - 15.5 %   Platelets 314 150 - 400 K/uL   nRBC 0.0 0.0 - 0.2 %   Neutrophils Relative % 87 %   Neutro Abs 13.6 (H) 1.7 - 7.7 K/uL   Lymphocytes  Relative 5 %   Lymphs Abs 0.8 0.7 - 4.0 K/uL   Monocytes Relative 6 %   Monocytes Absolute 1.0 0.1 - 1.0 K/uL   Eosinophils Relative 1 %   Eosinophils Absolute 0.1 0.0 - 0.5 K/uL   Basophils Relative 0 %   Basophils Absolute 0.0 0.0 - 0.1 K/uL   Immature Granulocytes 1 %   Abs Immature Granulocytes 0.08 (H) 0.00 - 0.07 K/uL    Comment: Performed at [facility], [place] [address] [place], [place] [address]  Type and screen [place]     Status: None   Collection Time: 11/27/18  5:16 PM  Result Value Ref Range   ABO/RH(D) O POS    Antibody Screen NEG    Sample Expiration      11/30/2018 Performed at [facility], [place] [address], [place], [place] [address]   ABO/Rh     Status: None (Preliminary result)   Collection Time: 11/27/18  5:16 PM  Result Value Ref Range   ABO/RH(D)      O POS Performed at [facility], [place] [address], [place],  [address]    Dg Chest 1 View  Result Date: 11/27/2018 CLINICAL DATA:  Fall.  Hip pain. EXAM: CHEST  1 VIEW COMPARISON:  10/23/2018 FINDINGS: Atherosclerotic calcification of the aortic arch. Heart size within normal limits. Emphysema. Old granulomatous disease. Mild chronic interstitial accentuation. The patient is rotated to the bright on today's radiograph, reducing diagnostic sensitivity and specificity. IMPRESSION: 1. No acute findings. 2. Aortic Atherosclerosis (ICD10-I70.0) and Emphysema (ICD10-J43.9). 3. Old granulomatous disease. Electronically Signed   By: Van Clines M.D.   On: 11/27/2018 17:27   Ct Head Wo Contrast  Result Date: 11/27/2018 CLINICAL DATA:  Fall, head trauma EXAM: CT HEAD WITHOUT CONTRAST TECHNIQUE: Contiguous axial images were obtained from the base of the skull through the vertex without intravenous contrast. COMPARISON:  None. FINDINGS: Brain: Mild atrophy. Chronic white matter changes. Negative for acute infarct, hemorrhage, mass  Vascular: Atherosclerotic calcification. Negative for hyperdense vessel Skull: Negative for fracture Sinuses/Orbits: Paranasal sinuses clear.  Bilateral eye surgery. Other: None IMPRESSION: Atrophy and chronic microvascular ischemia.  No acute abnormality. Electronically Signed   By: Franchot Gallo M.D.   On: 11/27/2018 17:42   Ct Pelvis Wo Contrast  Result Date: 11/27/2018 CLINICAL DATA:  Left hip pain after fall. EXAM: CT PELVIS WITHOUT CONTRAST TECHNIQUE: Multidetector CT imaging of the pelvis was performed following the standard protocol without intravenous contrast. COMPARISON:  Pelvic radiograph from 10/21/2018 FINDINGS: Urinary Tract: The included kidneys and ureters are unremarkable. The urinary bladder is physiologically distended without rupture. Bowel: Moderate stool retention within the colon. Sigmoid diverticulosis is identified without acute diverticulitis. Vascular/Lymphatic: Moderate aortoiliac and branch vessel atherosclerosis. Reproductive:  The uterus and adnexa are nonacute. Other: The partially included gallbladder containing a few layering stones. Musculoskeletal: There is an acute ventral and medially displaced fracture involving the ala and body of the left iliac bone. There is ventral displacement by approximately 11 mm of the main fracture fragment. The fracture extends to the cephalad margin the left SI joint, series 3/64. No pelvic diastasis. No involvement of the left acetabulum. Redemonstration of subacute left puboacetabular and inferior pubic rami fractures with evolving callus. Degenerative joint space narrowing of both hips without dislocation. No joint effusion. Mild associated soft tissue induration compatible with posttraumatic contusions along the lateral aspect of the left hip and pelvis. IMPRESSION: 1. Acute ventral and medially displaced fracture of the left iliac bone with ventral displacement by approximately 11 mm. 2. Subacute left puboacetabular and inferior pubic  rami fractures with evolving callus. 3. Sigmoid diverticulosis without acute diverticulitis. 4. Aortoiliac and branch vessel atherosclerosis. Electronically Signed   By: Ashley Royalty M.D.   On: 11/27/2018 18:50   Dg Hip Unilat With Pelvis 2-3 Views Left  Result Date: 11/27/2018 CLINICAL DATA:  Left hip pain, fall. EXAM: DG HIP (WITH OR WITHOUT PELVIS) 2-3V LEFT COMPARISON:  10/21/2018 FINDINGS: Bony demineralization. Disrupted iliopubic line favoring a fracture of the acetabulum or the lateral margin of the superior pubic ramus. Atherosclerosis noted. Possible fracture of the inferior pubic ramus on the left. Possible fracture the left pubic body. Such fractures are often accompanied by fractures of the sacrum. IMPRESSION: 1. Left pelvic fracture with disruption of the iliopubic line and possible involvement of the pubic body and inferior ramus on the left. These are often accompanied by fractures of the sacrum. I recommend dedicated CT of the pelvis for definitive characterization. 2. Atherosclerosis. 3. Bony demineralization. Electronically Signed   By: Van Clines M.D.   On: 11/27/2018 17:25    Pending Labs Unresulted Labs (From admission, onward)   None      Vitals/Pain Today's Vitals   11/27/18 1843 11/27/18 1900 11/27/18 1909 11/27/18 2030  BP:  118/64  126/80  Pulse: (!) 103 98  100  Resp: 16 11  18   Temp:      SpO2: 95% 99%  97%  PainSc:   0-No pain     Isolation Precautions No active isolations  Medications Medications  fentaNYL (SUBLIMAZE) injection 50 mcg (50 mcg Intravenous Given 11/27/18 1841)  oxyCODONE-acetaminophen (PERCOCET/ROXICET) 5-325 MG per tablet 1 tablet (1 tablet Oral Given 11/27/18 2014)    Mobility STRETCHER

## 2018-11-27 NOTE — Progress Notes (Signed)
Patient with multiple falls, 84 years old, left pubic ramus and iliac wing fracture.  Recommend physical therapy for ambulatory function, okay to weight-bear as tolerated, I do not think she is going to need surgery, full consult to follow.  Johnny Bridge, MD

## 2018-11-27 NOTE — H&P (Signed)
History and Physical    Sheila Mcconnell RTM:[id number] DOB: [birth year] DOA: 11/27/2018  PCP: Ma Hillock, DO  Patient coming from: Assisted living facility.  Chief Complaint: Fall.  HPI: Sheila Mcconnell is a 84 y.o. female with history of hypertension, COPD, anemia who was recently admitted last month for left wrist fracture and eventually transferred to rehab when patient was just discharged from rehab and is presently living in assisted living facility had a fall.  Patient states she was walking to the bathroom and suddenly she fell.  Denies hitting her head or losing consciousness.  Did not have any palpitations chest pain or shortness of breath.  ED Course: In the ER patient had a CAT scan of the pelvis which shows acute displaced left iliac bone fracture and also subacute pubo-acetabular and inferior pubic rami fracture.  On-call orthopedic surgeon Dr. Mardelle Matte was consulted and orthopedic at this time feels patient will be managed conservatively.  During the pain patient is being admitted for further management.  Lab work showed leukocytosis patient is afebrile UA is pending.  Chest x-ray was unremarkable.  Patient had come to the ER 4 days ago after patient was complaining of left elbow pain and swelling at that time patient splint for the left wrist fracture was opened and done again.  Review of Systems: As per HPI, rest all negative.   Past Medical History:  Diagnosis Date  . Allergy   . Cataracts, bilateral   . Dependence on supplemental oxygen    3.5l (pts stated 2.5 LNC), [facility] services  . Emphysema of lung ([place])   . GERD (gastroesophageal reflux disease)   . Hyperlipidemia   . Hypertension   . Osteoporosis    had been on Boniva    Past Surgical History:  Procedure Laterality Date  . NO PAST SURGERIES       reports that she has quit smoking. Her smoking use included cigarettes. She has a 50.00 pack-year smoking history. She has never used smokeless tobacco. She reports  current alcohol use of about 2.0 standard drinks of alcohol per week. She reports that she does not use drugs.  Allergies  Allergen Reactions  . Sulfa Antibiotics Nausea Only    Family History  Problem Relation Age of Onset  . Diabetes Mother   . Hearing loss Mother   . Lymphoma Mother     Prior to Admission medications   Medication Sig Start Date End Date Taking? Authorizing Provider  Acetaminophen (CHLORASEPTIC SORE THROAT PO) Take by mouth daily as needed.   Yes [provider]  acetaminophen (TYLENOL) 500 MG tablet Take 500 mg by mouth every 6 (six) hours as needed for moderate pain.   Yes [provider]  amLODipine (NORVASC) 5 MG tablet Take 1 tablet (5 mg total) daily by mouth. 10/16/17  Yes Kuneff, Renee A, DO  Dextromethorphan-guaiFENesin (ROBITUSSIN DM PO) Take 15 mLs by mouth every 4 (four) hours as needed (cough).   Yes [provider]  docusate sodium (COLACE) 100 MG capsule Take 1 capsule (100 mg total) by mouth 2 (two) times daily. 10/24/18  Yes Hosie Poisson, MD  Fluticasone-Salmeterol (ADVAIR) 250-50 MCG/DOSE AEPB Inhale 1 puff into the lungs 2 (two) times daily.   Yes [provider]  furosemide (LASIX) 20 MG tablet Take 20 mg by mouth.   Yes [provider]  ipratropium (ATROVENT HFA) 17 MCG/ACT inhaler Inhale 2 puffs into the lungs every 6 (six) hours.   Yes [provider]  mirtazapine (REMERON) 15 MG tablet Take 7.5 mg by mouth at bedtime.   Yes [provider]  Nutritional Supplements (ENSURE PO) Take 1 Can by mouth 2 (two) times daily.   Yes [provider]  tiotropium (SPIRIVA) 18 MCG inhalation capsule Place 1 capsule (18 mcg total) into inhaler and inhale daily. 07/06/18  Yes Shelly Coss, MD  traMADol (ULTRAM) 50 MG tablet Take 25 mg by mouth daily as needed for moderate pain.   Yes [provider]  budesonide-formoterol (SYMBICORT) 160-4.5 MCG/ACT inhaler Inhale 2 puffs into the  lungs 2 (two) times daily. Patient not taking: Reported on 11/27/2018 07/06/18   Shelly Coss, MD  ipratropium-albuterol (DUONEB) 0.5-2.5 (3) MG/3ML SOLN Take 3 mLs by nebulization 3 (three) times daily. Patient taking differently: Take 3 mLs by nebulization 3 (three) times daily as needed (for SOB).  07/06/18   Shelly Coss, MD    Physical Exam: Vitals:   11/27/18 1842 11/27/18 1843 11/27/18 1900 11/27/18 2030  BP: 134/75  118/64 126/80  Pulse: (!) 104 (!) 103 98 100  Resp: 18 16 11 18   Temp:      SpO2: 95% 95% 99% 97%  Weight:    43 kg  Height:    5\' 5"  (1.651 m)      Constitutional: Moderately built and nourished. Vitals:   11/27/18 1842 11/27/18 1843 11/27/18 1900 11/27/18 2030  BP: 134/75  118/64 126/80  Pulse: (!) 104 (!) 103 98 100  Resp: 18 16 11 18   Temp:      SpO2: 95% 95% 99% 97%  Weight:    43 kg  Height:    5\' 5"  (1.651 m)   Eyes: Anicteric no pallor. ENMT: No discharge from the ears eyes nose or mouth. Neck: No mass felt.  No neck rigidity no JVD appreciated. Respiratory: No rhonchi or crepitations. Cardiovascular: S1-S2 heard. Abdomen: Soft nontender bowel sounds present. Musculoskeletal: Pain on moving lower extremities.  Left upper extremity is in splint from recent fracture. Skin: No rash. Neurologic: Alert awake oriented to time place and person.  Moves all extremities. Psychiatric: Appears normal.  Normal affect.   Labs on Admission: I have personally reviewed following labs and imaging studies  CBC: Recent Labs  Lab 11/27/18 1716  WBC 15.5*  NEUTROABS 13.6*  HGB 11.1*  HCT 37.4  MCV 100.5*  PLT 169   Basic Metabolic Panel: Recent Labs  Lab 11/27/18 1716  NA 139  K 4.1  CL 94*  CO2 36*  GLUCOSE 133*  BUN 17  CREATININE 0.43*  CALCIUM 9.1   GFR: Estimated Creatinine Clearance: 32.4 mL/min (A) (by C-G formula based on SCr of 0.43 mg/dL (L)). Liver Function Tests: No results for input(s): AST, ALT, ALKPHOS, BILITOT, PROT,  ALBUMIN in the last 168 hours. No results for input(s): LIPASE, AMYLASE in the last 168 hours. No results for input(s): AMMONIA in the last 168 hours. Coagulation Profile: No results for input(s): INR, PROTIME in the last 168 hours. Cardiac Enzymes: No results for input(s): CKTOTAL, CKMB, CKMBINDEX, TROPONINI in the last 168 hours. BNP (last 3 results) No results for input(s): PROBNP in the last 8760 hours. HbA1C: No results for input(s): HGBA1C in the last 72 hours. CBG: No results for input(s): GLUCAP in the last 168 hours. Lipid Profile: No results for input(s): CHOL, HDL, LDLCALC, TRIG, CHOLHDL, LDLDIRECT in the last 72 hours. Thyroid Function Tests: No results for input(s): TSH, T4TOTAL, FREET4, T3FREE, THYROIDAB in the last 72 hours. Anemia Panel: No  results for input(s): VITAMINB12, FOLATE, FERRITIN, TIBC, IRON, RETICCTPCT in the last 72 hours. Urine analysis:    Component Value Date/Time   COLORURINE YELLOW 10/21/2018 [place] 10/21/2018 0647   LABSPEC 1.017 10/21/2018 0647   PHURINE 6.0 10/21/2018 [place] 10/21/2018 0647   HGBUR NEGATIVE 10/21/2018 [place] 10/21/2018 [place] 10/21/2018 0647   PROTEINUR NEGATIVE 10/21/2018 0647   NITRITE NEGATIVE 10/21/2018 0647   LEUKOCYTESUR NEGATIVE 10/21/2018 0647   Sepsis Labs: @LABRCNTIP (procalcitonin:4,lacticidven:4) )No results found for this or any previous visit (from the past 240 hour(s)).   Radiological Exams on Admission: Dg Chest 1 View  Result Date: 11/27/2018 CLINICAL DATA:  Fall.  Hip pain. EXAM: CHEST  1 VIEW COMPARISON:  10/23/2018 FINDINGS: Atherosclerotic calcification of the aortic arch. Heart size within normal limits. Emphysema. Old granulomatous disease. Mild chronic interstitial accentuation. The patient is rotated to the bright on today's radiograph, reducing diagnostic sensitivity and specificity. IMPRESSION: 1. No acute findings. 2.  Aortic Atherosclerosis (ICD10-I70.0) and Emphysema (ICD10-J43.9). 3. Old granulomatous disease. Electronically Signed   By: Van Clines M.D.   On: 11/27/2018 17:27   Ct Head Wo Contrast  Result Date: 11/27/2018 CLINICAL DATA:  Fall, head trauma EXAM: CT HEAD WITHOUT CONTRAST TECHNIQUE: Contiguous axial images were obtained from the base of the skull through the vertex without intravenous contrast. COMPARISON:  None. FINDINGS: Brain: Mild atrophy. Chronic white matter changes. Negative for acute infarct, hemorrhage, mass Vascular: Atherosclerotic calcification. Negative for hyperdense vessel Skull: Negative for fracture Sinuses/Orbits: Paranasal sinuses clear.  Bilateral eye surgery. Other: None IMPRESSION: Atrophy and chronic microvascular ischemia.  No acute abnormality. Electronically Signed   By: Franchot Gallo M.D.   On: 11/27/2018 17:42   Ct Pelvis Wo Contrast  Result Date: 11/27/2018 CLINICAL DATA:  Left hip pain after fall. EXAM: CT PELVIS WITHOUT CONTRAST TECHNIQUE: Multidetector CT imaging of the pelvis was performed following the standard protocol without intravenous contrast. COMPARISON:  Pelvic radiograph from 10/21/2018 FINDINGS: Urinary Tract: The included kidneys and ureters are unremarkable. The urinary bladder is physiologically distended without rupture. Bowel: Moderate stool retention within the colon. Sigmoid diverticulosis is identified without acute diverticulitis. Vascular/Lymphatic: Moderate aortoiliac and branch vessel atherosclerosis. Reproductive:  The uterus and adnexa are nonacute. Other: The partially included gallbladder containing a few layering stones. Musculoskeletal: There is an acute ventral and medially displaced fracture involving the ala and body of the left iliac bone. There is ventral displacement by approximately 11 mm of the main fracture fragment. The fracture extends to the cephalad margin the left SI joint, series 3/64. No pelvic diastasis. No  involvement of the left acetabulum. Redemonstration of subacute left puboacetabular and inferior pubic rami fractures with evolving callus. Degenerative joint space narrowing of both hips without dislocation. No joint effusion. Mild associated soft tissue induration compatible with posttraumatic contusions along the lateral aspect of the left hip and pelvis. IMPRESSION: 1. Acute ventral and medially displaced fracture of the left iliac bone with ventral displacement by approximately 11 mm. 2. Subacute left puboacetabular and inferior pubic rami fractures with evolving callus. 3. Sigmoid diverticulosis without acute diverticulitis. 4. Aortoiliac and branch vessel atherosclerosis. Electronically Signed   By: Ashley Royalty M.D.   On: 11/27/2018 18:50   Dg Hip Unilat With Pelvis 2-3 Views Left  Result Date: 11/27/2018 CLINICAL DATA:  Left hip pain, fall. EXAM: DG HIP (WITH OR WITHOUT PELVIS) 2-3V LEFT COMPARISON:  10/21/2018 FINDINGS: Bony demineralization. Disrupted  iliopubic line favoring a fracture of the acetabulum or the lateral margin of the superior pubic ramus. Atherosclerosis noted. Possible fracture of the inferior pubic ramus on the left. Possible fracture the left pubic body. Such fractures are often accompanied by fractures of the sacrum. IMPRESSION: 1. Left pelvic fracture with disruption of the iliopubic line and possible involvement of the pubic body and inferior ramus on the left. These are often accompanied by fractures of the sacrum. I recommend dedicated CT of the pelvis for definitive characterization. 2. Atherosclerosis. 3. Bony demineralization. Electronically Signed   By: Van Clines M.D.   On: 11/27/2018 17:25    EKG: Independently reviewed.  Sinus tachycardia.  Assessment/Plan Principal Problem:   Pelvis fracture (HCC) Active Problems:   Emphysema lung ([place])   Hypertension   Pelvic fracture ([place])    1. Acute displaced left iliac bone fracture with subacute left  pubo-acetabular inferior pubic rami fracture -patient is on pain relief medications.  Dr. Mardelle Matte will be seeing patient in consult at this time plan is to manage conservatively.  Physical therapy consult.  Further recommendations per orthopedics. 2. Leukocytosis likely reactionary.  Patient is afebrile.  Chest x-ray unremarkable UA is pending. 3. Hypertension on amlodipine. 4. Emphysema not actively wheezing continue home inhalers. 5. Chronic anemia follow CBC.  6. Recent left wrist fracture.   DVT prophylaxis: SCDs for now and if no procedure dissipated change to Lovenox. Code Status: Full code. Family Communication: Patient's daughter. Disposition Plan: May need rehab. Consults called: Orthopedics. Admission status: Inpatient.   Rise Patience MD [facility] Pager [phone].  If 7PM-7AM, please contact night-coverage [email or web address] Password [facility]  11/27/2018, 9:57 PM

## 2018-11-27 NOTE — ED Notes (Signed)
Bed: [facility] Expected date:  Expected time:  Means of arrival:  Comments: EMS

## 2018-11-27 NOTE — ED Notes (Signed)
WILL TRANSPORT PT TO 3W 1333-1 . AAOX4. PT IN NO APPARENT DISTRESS WITH MODERATE PAIN. FAMILY AT THE BEDSIDE. THE OPPORTUNITY TO ASK QUESTIONS WAS PROVIDED.

## 2018-11-28 ENCOUNTER — Other Ambulatory Visit: Payer: Self-pay

## 2018-11-28 DIAGNOSIS — S32302A Unspecified fracture of left ilium, initial encounter for closed fracture: Secondary | ICD-10-CM | POA: Diagnosis not present

## 2018-11-28 DIAGNOSIS — S329XXA Fracture of unspecified parts of lumbosacral spine and pelvis, initial encounter for closed fracture: Secondary | ICD-10-CM

## 2018-11-28 DIAGNOSIS — S32810A Multiple fractures of pelvis with stable disruption of pelvic ring, initial encounter for closed fracture: Secondary | ICD-10-CM | POA: Diagnosis not present

## 2018-11-28 DIAGNOSIS — J439 Emphysema, unspecified: Secondary | ICD-10-CM | POA: Diagnosis not present

## 2018-11-28 DIAGNOSIS — S32309A Unspecified fracture of unspecified ilium, initial encounter for closed fracture: Secondary | ICD-10-CM | POA: Diagnosis not present

## 2018-11-28 LAB — CBC
HEMATOCRIT: 34.7 % — AB (ref 36.0–46.0)
Hemoglobin: 10.2 g/dL — ABNORMAL LOW (ref 12.0–15.0)
MCH: 30.6 pg (ref 26.0–34.0)
MCHC: 29.4 g/dL — ABNORMAL LOW (ref 30.0–36.0)
MCV: 104.2 fL — ABNORMAL HIGH (ref 80.0–100.0)
Platelets: 240 10*3/uL (ref 150–400)
RBC: 3.33 MIL/uL — AB (ref 3.87–5.11)
RDW: 14.7 % (ref 11.5–15.5)
WBC: 10.2 10*3/uL (ref 4.0–10.5)
nRBC: 0 % (ref 0.0–0.2)

## 2018-11-28 LAB — BASIC METABOLIC PANEL
Anion gap: 9 (ref 5–15)
BUN: 19 mg/dL (ref 8–23)
CO2: 33 mmol/L — ABNORMAL HIGH (ref 22–32)
Calcium: 8.8 mg/dL — ABNORMAL LOW (ref 8.9–10.3)
Chloride: 96 mmol/L — ABNORMAL LOW (ref 98–111)
Creatinine, Ser: 0.36 mg/dL — ABNORMAL LOW (ref 0.44–1.00)
GFR calc Af Amer: >60 mL/min (ref >60)
GFR calc non Af Amer: >60 mL/min (ref >60)
Glucose, Bld: 105 mg/dL — ABNORMAL HIGH (ref 70–99)
Potassium: 4 mmol/L (ref 3.5–5.1)
SODIUM: 138 mmol/L (ref 135–145)

## 2018-11-28 LAB — ABO/RH: ABO/RH(D): O POS

## 2018-11-28 MED ORDER — HYDROCODONE-ACETAMINOPHEN 5-325 MG PO TABS: 1.0000 | ORAL_TABLET | Freq: Four times a day (QID) | ORAL | Status: DC | PRN

## 2018-11-28 MED ORDER — VITAMIN D 25 MCG (1000 UNIT) PO TABS
2000.0000 [IU] | ORAL_TABLET | Freq: Two times a day (BID) | ORAL | Status: DC
  Administered 2018-11-28 – 2018-11-30 (×4): 2000 [IU] via ORAL
  Filled 2018-11-28 (×5): qty 2

## 2018-11-28 MED ORDER — KETOROLAC TROMETHAMINE 15 MG/ML IJ SOLN
7.5000 mg | Freq: Three times a day (TID) | INTRAMUSCULAR | Status: DC
  Administered 2018-11-28 – 2018-11-30 (×6): 7.5 mg via INTRAVENOUS
  Filled 2018-11-28 (×6): qty 1

## 2018-11-28 MED ORDER — ALUM & MAG HYDROXIDE-SIMETH 200-200-20 MG/5ML PO SUSP
15.0000 mL | ORAL | Status: DC | PRN
  Administered 2018-11-28: 15 mL via ORAL
  Filled 2018-11-28: qty 30

## 2018-11-28 MED ORDER — CALCIUM CITRATE 950 (200 CA) MG PO TABS
200.0000 mg | ORAL_TABLET | Freq: Two times a day (BID) | ORAL | Status: DC
  Administered 2018-11-28 – 2018-11-30 (×3): 200 mg via ORAL
  Filled 2018-11-28 (×5): qty 1

## 2018-11-28 MED ORDER — VITAMIN C 500 MG PO TABS
500.0000 mg | ORAL_TABLET | Freq: Every day | ORAL | Status: DC
  Administered 2018-11-28 – 2018-11-30 (×3): 500 mg via ORAL
  Filled 2018-11-28 (×3): qty 1

## 2018-11-28 MED ORDER — HYDROCODONE-ACETAMINOPHEN 5-325 MG PO TABS: 1.0000 | ORAL_TABLET | ORAL | Status: DC | PRN

## 2018-11-28 MED ORDER — ALBUTEROL SULFATE (2.5 MG/3ML) 0.083% IN NEBU: 2.5000 mg | INHALATION_SOLUTION | RESPIRATORY_TRACT | Status: DC | PRN

## 2018-11-28 NOTE — Consult Note (Signed)
Orthopaedic Trauma Service (OTS) Consult   Patient ID: Sheila Mcconnell MRN: [id number] DOB/AGE: 82/23/1928 81 y.o.   Reason for Consult: Pelvic ring fracture Referring Physician:  Gean Birchwood, MD   HPI: Sheila Mcconnell is an 84 y.o. white female with history of osteoporosis, O2 dependence, emphysema who is well-known to the orthopedic trauma service after sustaining a ground-level fall approximately 6 weeks ago with pubic rami fractures on the left side along with a left distal radius fracture.  These were all treated nonoperatively.  Patient was ultimately discharged to a skilled nursing facility.  She was discharged back to assisted living facility several days ago.  Unfortunate she was mobilizing to the bathroom when she tripped and fell on her left side resulting in a left iliac wing fracture.  Patient was brought to [facility] and admitted to the medical service.  Orthopedics was consulted for further evaluation of her left iliac wing fracture.  Patient was also recently seen in the emergency department where she had her short arm cast removed and a sugar tong splint applied.  To my knowledge this was not communicated to our office.  She has been in a splint or cast since her initial injury.  We had been changing her cast regularly due to significant skin tear which has been healing nicely.  Per daughter's report patient has been mobilizing very well with the use of a walker however she was not discharge from skilled nursing facility with a platform which was requested as she is nonweightbearing to her left wrist.  She was not having any issues with respect to her pelvic ring fractures and has been doing well overall when she sustained this recent fall.  Patient seen and evaluated on 11/28/2018 by the orthopedic trauma service.  She complains only of mild left-sided pelvic pain.  Denies any numbness or tingling.  She denies any new onset of pain in her left wrist as  well.  She did attempt to work with therapy today however her pain was too severe in her left pelvis.   During her previous hospitalization we did obtain vitamin D levels which were noted to be 20.4 ng/mL.  She has a longstanding history of osteoporosis.  She has not been on any pharmacologic treatment for osteoporosis although her chart says Boniva family states that she was unable to tolerate this at all.  Past Medical History:  Diagnosis Date  . Allergy   . Cataracts, bilateral   . Dependence on supplemental oxygen    3.5l (pts stated 2.5 LNC), [employer] services  . Emphysema of lung ([place])   . GERD (gastroesophageal reflux disease)   . Hyperlipidemia   . Hypertension   . Osteoporosis    had been on Boniva    Past Surgical History:  Procedure Laterality Date  . NO PAST SURGERIES      Family History  Problem Relation Age of Onset  . Diabetes Mother   . Hearing loss Mother   . Lymphoma Mother     Social History:  reports that she has quit smoking. Her smoking use included cigarettes. She has a 50.00 pack-year smoking history. She has never used smokeless tobacco. She reports current alcohol use of about 2.0 standard drinks of alcohol per week. She reports that she does not use drugs.  Allergies:  Allergies  Allergen Reactions  . Sulfa Antibiotics Nausea Only    Medications: I have reviewed the patient's current medications.  Results for orders  placed or performed during the hospital encounter of 11/27/18 (from the past 48 hour(s))  Basic metabolic panel     Status: Abnormal   Collection Time: 11/27/18  5:16 PM  Result Value Ref Range   Sodium 139 135 - 145 mmol/L   Potassium 4.1 3.5 - 5.1 mmol/L   Chloride 94 (L) 98 - 111 mmol/L   CO2 36 (H) 22 - 32 mmol/L   Glucose, Bld 133 (H) 70 - 99 mg/dL   BUN 17 8 - 23 mg/dL   Creatinine, Ser 0.43 (L) 0.44 - 1.00 mg/dL   Calcium 9.1 8.9 - 10.3 mg/dL   GFR calc non Af Amer >60 >60 mL/min   GFR calc Af Amer >60 >60 mL/min    Anion gap 9 5 - 15    Comment: Performed at [facility], [place] [address], [place], [place] [address]  CBC WITH DIFFERENTIAL     Status: Abnormal   Collection Time: 11/27/18  5:16 PM  Result Value Ref Range   WBC 15.5 (H) 4.0 - 10.5 K/uL   RBC 3.72 (L) 3.87 - 5.11 MIL/uL   Hemoglobin 11.1 (L) 12.0 - 15.0 g/dL   HCT 37.4 36.0 - 46.0 %   MCV 100.5 (H) 80.0 - 100.0 fL   MCH 29.8 26.0 - 34.0 pg   MCHC 29.7 (L) 30.0 - 36.0 g/dL   RDW 14.8 11.5 - 15.5 %   Platelets 314 150 - 400 K/uL   nRBC 0.0 0.0 - 0.2 %   Neutrophils Relative % 87 %   Neutro Abs 13.6 (H) 1.7 - 7.7 K/uL   Lymphocytes Relative 5 %   Lymphs Abs 0.8 0.7 - 4.0 K/uL   Monocytes Relative 6 %   Monocytes Absolute 1.0 0.1 - 1.0 K/uL   Eosinophils Relative 1 %   Eosinophils Absolute 0.1 0.0 - 0.5 K/uL   Basophils Relative 0 %   Basophils Absolute 0.0 0.0 - 0.1 K/uL   Immature Granulocytes 1 %   Abs Immature Granulocytes 0.08 (H) 0.00 - 0.07 K/uL    Comment: Performed at [facility], [place] [address] [place], [place] [address]  Type and screen [place]     Status: None   Collection Time: 11/27/18  5:16 PM  Result Value Ref Range   ABO/RH(D) O POS    Antibody Screen NEG    Sample Expiration      11/30/2018 Performed at [facility], [place] [address], [place], [place] [address]   ABO/Rh     Status: None   Collection Time: 11/27/18  5:16 PM  Result Value Ref Range   ABO/RH(D)      O POS Performed at [facility], [place] [address] [place], [place] [address]   Basic metabolic panel     Status: Abnormal   Collection Time: 11/28/18  4:59 AM  Result Value Ref Range   Sodium 138 135 - 145 mmol/L   Potassium 4.0 3.5 - 5.1 mmol/L   Chloride 96 (L) 98 - 111 mmol/L   CO2 33 (H) 22 - 32 mmol/L   Glucose, Bld 105 (H) 70 - 99 mg/dL   BUN 19 8 - 23 mg/dL   Creatinine, Ser 0.36 (L) 0.44 - 1.00 mg/dL   Calcium 8.8 (L) 8.9 - 10.3 mg/dL    GFR calc non Af Amer >60 >60 mL/min   GFR calc Af Amer >60 >60 mL/min   Anion gap 9 5 -  15    Comment: Performed at [facility], [place] [address] [place], [place] [address]  CBC     Status: Abnormal   Collection Time: 11/28/18  4:59 AM  Result Value Ref Range   WBC 10.2 4.0 - 10.5 K/uL   RBC 3.33 (L) 3.87 - 5.11 MIL/uL   Hemoglobin 10.2 (L) 12.0 - 15.0 g/dL   HCT 34.7 (L) 36.0 - 46.0 %   MCV 104.2 (H) 80.0 - 100.0 fL   MCH 30.6 26.0 - 34.0 pg   MCHC 29.4 (L) 30.0 - 36.0 g/dL   RDW 14.7 11.5 - 15.5 %   Platelets 240 150 - 400 K/uL   nRBC 0.0 0.0 - 0.2 %    Comment: Performed at [facility], [place] [address] [place], [place] [address]    Dg Chest 1 View  Result Date: 11/27/2018 CLINICAL DATA:  Fall.  Hip pain. EXAM: CHEST  1 VIEW COMPARISON:  10/23/2018 FINDINGS: Atherosclerotic calcification of the aortic arch. Heart size within normal limits. Emphysema. Old granulomatous disease. Mild chronic interstitial accentuation. The patient is rotated to the bright on today's radiograph, reducing diagnostic sensitivity and specificity. IMPRESSION: 1. No acute findings. 2. Aortic Atherosclerosis (ICD10-I70.0) and Emphysema (ICD10-J43.9). 3. Old granulomatous disease. Electronically Signed   By: Van Clines M.D.   On: 11/27/2018 17:27   Ct Head Wo Contrast  Result Date: 11/27/2018 CLINICAL DATA:  Fall, head trauma EXAM: CT HEAD WITHOUT CONTRAST TECHNIQUE: Contiguous axial images were obtained from the base of the skull through the vertex without intravenous contrast. COMPARISON:  None. FINDINGS: Brain: Mild atrophy. Chronic white matter changes. Negative for acute infarct, hemorrhage, mass Vascular: Atherosclerotic calcification. Negative for hyperdense vessel Skull: Negative for fracture Sinuses/Orbits: Paranasal sinuses clear.  Bilateral eye surgery. Other: None IMPRESSION: Atrophy and chronic microvascular ischemia.  No acute abnormality.  Electronically Signed   By: Franchot Gallo M.D.   On: 11/27/2018 17:42   Ct Pelvis Wo Contrast  Result Date: 11/27/2018 CLINICAL DATA:  Left hip pain after fall. EXAM: CT PELVIS WITHOUT CONTRAST TECHNIQUE: Multidetector CT imaging of the pelvis was performed following the standard protocol without intravenous contrast. COMPARISON:  Pelvic radiograph from 10/21/2018 FINDINGS: Urinary Tract: The included kidneys and ureters are unremarkable. The urinary bladder is physiologically distended without rupture. Bowel: Moderate stool retention within the colon. Sigmoid diverticulosis is identified without acute diverticulitis. Vascular/Lymphatic: Moderate aortoiliac and branch vessel atherosclerosis. Reproductive:  The uterus and adnexa are nonacute. Other: The partially included gallbladder containing a few layering stones. Musculoskeletal: There is an acute ventral and medially displaced fracture involving the ala and body of the left iliac bone. There is ventral displacement by approximately 11 mm of the main fracture fragment. The fracture extends to the cephalad margin the left SI joint, series 3/64. No pelvic diastasis. No involvement of the left acetabulum. Redemonstration of subacute left puboacetabular and inferior pubic rami fractures with evolving callus. Degenerative joint space narrowing of both hips without dislocation. No joint effusion. Mild associated soft tissue induration compatible with posttraumatic contusions along the lateral aspect of the left hip and pelvis. IMPRESSION: 1. Acute ventral and medially displaced fracture of the left iliac bone with ventral displacement by approximately 11 mm. 2. Subacute left puboacetabular and inferior pubic rami fractures with evolving callus. 3. Sigmoid diverticulosis without acute diverticulitis. 4. Aortoiliac and branch vessel atherosclerosis. Electronically Signed   By: Ashley Royalty M.D.   On: 11/27/2018 18:50   Dg Hip Unilat With Pelvis  2-3 Views  Left  Result Date: 11/27/2018 CLINICAL DATA:  Left hip pain, fall. EXAM: DG HIP (WITH OR WITHOUT PELVIS) 2-3V LEFT COMPARISON:  10/21/2018 FINDINGS: Bony demineralization. Disrupted iliopubic line favoring a fracture of the acetabulum or the lateral margin of the superior pubic ramus. Atherosclerosis noted. Possible fracture of the inferior pubic ramus on the left. Possible fracture the left pubic body. Such fractures are often accompanied by fractures of the sacrum. IMPRESSION: 1. Left pelvic fracture with disruption of the iliopubic line and possible involvement of the pubic body and inferior ramus on the left. These are often accompanied by fractures of the sacrum. I recommend dedicated CT of the pelvis for definitive characterization. 2. Atherosclerosis. 3. Bony demineralization. Electronically Signed   By: Van Clines M.D.   On: 11/27/2018 17:25    Review of Systems  Constitutional: Negative for chills and fever.  Respiratory: Negative for shortness of breath.   Cardiovascular: Negative for chest pain.  Musculoskeletal:       Left sided pelvic pain   Neurological: Negative for tremors and sensory change.   Blood pressure (!) 129/59, pulse 96, temperature 98.1 F (36.7 C), temperature source Oral, resp. rate 14, height 5\' 5"  (1.651 m), weight 43 kg, SpO2 100 %. Physical Exam Constitutional:      General: She is awake.     Comments: Elderly white female, no acute distress, resting in bed on her right side Moderate Sarcopenia noted  Cardiovascular:     Rate and Rhythm: Normal rate and regular rhythm.  Pulmonary:     Effort: No respiratory distress.  Abdominal:     General: Abdomen is flat.     Palpations: Abdomen is soft.     Comments: + Bowel sounds   Musculoskeletal:     Comments: Pelvis/B lower extremities  Moderate tenderness with palpation of her left hemipelvis No gross instability of pelvis noted No posterior tenderness to her pelvis noted No traumatic wounds or  lesions noted Motor and sensory functions are grossly intact to her lower extremities + Peripheral pulses are noted Extremities are warm No pitting edema Moderate sarcopenia  No crepitus or gross motion noted with manipulation of her lower extremities  Left upper extremity Sugar tong splint is in place Motor and sensory functions are grossly intact No pain shoulder or upper arm Motor and sensory functions are grossly intact distally Digits are warm with good perfusion    Neurological:     Comments: Did not assess gait or coordination/balance    Psychiatric:        Behavior: Behavior is cooperative.     Assessment/Plan:  84 y/o female s/p recurrent falls  -recurrent falls  - acute L iliac wing fracture with moderate displacement   Overall think alignment is satisfactory.  Fracture does not extend into the left acetabulum nor does it extend significantly into the left SI joint.  Anterior ring and posterior ring appear to be stable.  Think that this can be managed nonoperatively and patient be weightbearing as tolerated on her left leg although it will be quite painful for several weeks.  Patient does have fairly significant osteoporosis present in her pelvis.  There is an area of what appears to be some central clearing at the fracture site but again I think this represents her profound osteoporosis and not a pathologic lesion.   Aggressive pain control  Mobilize as tolerated  PT and OT evaluations   Weight-bear as tolerated left leg with walker.  Platform walker on  the left side given left distal radius fracture which is 50 1/2 weeks old    Will likely need SNF placement  -Left distal radius fracture, 5 1/2 weeks post injury  Will have patient placed into a new short arm cast  Nonweightbearing to left wrist  Okay to weight-bear through elbow using a platform walker  Digit motion and elevation for swelling control  Ice as needed for swelling and pain control  - Pain  management:  Aggressive pain control within reason to all for mobilization   Will add norco and low dose toradol   - ABL anemia/Hemodynamics  Stable   - Medical issues   Per primary team    Leukocytosis normalized  - Metabolic Bone Disease:  + osteoporosis   Vitamin d supplementation    Calcium supplementation   Recommend pharmacologic treatment of her osteoporosis with an anabolic agent (forteo,tymlos or evenity)  - Activity:  Up with assistance  WBAT L LEx  WBAT thru L elbow with platform walker   - Impediments to fracture healing:  Osteoporosis  Vitamin d deficiency   Chronic lasix   Emphysema    - Dispo:  Therapy evals  SNF   Pain control     Jari Pigg, PA-C [phone] (C) 11/28/2018, 3:37 PM  [facility] [place] [address] [phone] (F)

## 2018-11-28 NOTE — Evaluation (Signed)
Physical Therapy Evaluation Patient Details Name: Sheila Mcconnell MRN: [id number] DOB: 1927/09/18 Today's Date: 11/28/2018   History of Present Illness  Pt is 84 y.o. female presenting s/p fall with acute ventral and medially displaced fx of L iliac bone.  Pt also with recent fall sustaining left wrist fracture and left superior and inferior pubic rami fracture secondary to fall at ALF. PMH significant for medical history significant of severe COPD, chronic hypoxic respiratory failure, GERD, hypertension, dyslipidemia, dependence on oxygen, HOH, and osteoporosis.  Clinical Impression  Pt admitted as above and presenting with functional mobility limitations 2* decreased L LE strength/ROM 2* pain and limited use of L UE following recent wrist fx.  Pt severely limited in all attempts to mobilize 2* severe pain.  Pt would benefit from follow up rehab at SNF level to maximize IND and safety.    Follow Up Recommendations SNF    Equipment Recommendations  None recommended by PT    Recommendations for Other Services       Precautions / Restrictions Precautions Precautions: Fall Restrictions Weight Bearing Restrictions: No      Mobility  Bed Mobility Overal bed mobility: Needs Assistance Bed Mobility: Supine to Sit;Sit to Supine     Supine to sit: +2 for physical assistance;Max assist Sit to supine: Total assist;+2 for physical assistance   General bed mobility comments: Pt assisted to EOB with use of pad but unable to attain erect sitting 2* ++pain with movement  Transfers                 General transfer comment: deferred 2* pain with movement  Ambulation/Gait                Stairs            Wheelchair Mobility    Modified Rankin (Stroke Patients Only)       Balance                                             Pertinent Vitals/Pain      Home Living Family/patient expects to be discharged to:: Skilled nursing facility                      Prior Function Level of Independence: Needs assistance      ADL's / Homemaking Assistance Needed: assistance with meals  Comments: Dr states pt was ambulating with RW at ALF     Hand Dominance   Dominant Hand: Right    Extremity/Trunk Assessment   Upper Extremity Assessment Upper Extremity Assessment: LUE deficits/detail LUE Deficits / Details: splint in place 2* recent wrist fx    Lower Extremity Assessment Lower Extremity Assessment: LLE deficits/detail LLE Deficits / Details: Unable to assess 2* pain with movement       Communication   Communication: HOH  Cognition Arousal/Alertness: Awake/alert Behavior During Therapy: Anxious Overall Cognitive Status: Within Functional Limits for tasks assessed                                        General Comments      Exercises     Assessment/Plan    PT Assessment Patient needs continued PT services  PT Problem List Decreased strength;Decreased range of motion;Decreased activity tolerance;Decreased balance;Decreased mobility;Decreased knowledge  of use of DME;Pain       PT Treatment Interventions DME instruction;Gait training;Functional mobility training;Therapeutic activities;Therapeutic exercise;Balance training;Patient/family education    PT Goals (Current goals can be found in the Care Plan section)  Acute Rehab PT Goals Patient Stated Goal: Hurt less PT Goal Formulation: With patient Time For Goal Achievement: 12/12/18 Potential to Achieve Goals: Poor    Frequency Min 2X/week   Barriers to discharge        Co-evaluation               AM-PAC PT "6 Clicks" Mobility  Outcome Measure Help needed turning from your back to your side while in a flat bed without using bedrails?: Total Help needed moving from lying on your back to sitting on the side of a flat bed without using bedrails?: Total Help needed moving to and from a bed to a chair (including a wheelchair)?:  Total Help needed standing up from a chair using your arms (e.g., wheelchair or bedside chair)?: Total Help needed to walk in hospital room?: Total Help needed climbing 3-5 steps with a railing? : Total 6 Click Score: 6    End of Session   Activity Tolerance: Patient limited by pain Patient left: in bed;with call bell/phone within reach;with family/visitor present Nurse Communication: Mobility status PT Visit Diagnosis: Difficulty in walking, not elsewhere classified (R26.2);History of falling (Z91.81);Repeated falls (R29.6);Pain Pain - Right/Left: Left    Time: 5364-6803 PT Time Calculation (min) (ACUTE ONLY): 12 min   Charges:   PT Evaluation $PT Eval Low Complexity: 1 Low          [place] Pager [phone] Office [phone]   Tangee Marszalek 11/28/2018, 4:17 PM

## 2018-11-28 NOTE — Progress Notes (Signed)
PROGRESS NOTE    Sheila Mcconnell  QZR:[id number] DOB: 1927-03-12 DOA: 11/27/2018 PCP: Ma Hillock, DO    Brief Narrative:84 y.o. female with history of hypertension, COPD, anemia who was recently admitted last month for left wrist fracture and eventually transferred to rehab when patient was just discharged from rehab and is presently living in assisted living facility had a fall.  Patient states she was walking to the bathroom and suddenly she fell.  Denies hitting her head or losing consciousness.  Did not have any palpitations chest pain or shortness of breath.  ED Course: In the ER patient had a CAT scan of the pelvis which shows acute displaced left iliac bone fracture and also subacute pubo-acetabular and inferior pubic rami fracture.  On-call orthopedic surgeon Dr. Mardelle Matte was consulted and orthopedic at this time feels patient will be managed conservatively.  During the pain patient is being admitted for further management.  Lab work showed leukocytosis patient is afebrile UA is pending.  Chest x-ray was unremarkable.  Patient had come to the ER 4 days ago after patient was complaining of left elbow pain and swelling at that time patient splint for the left wrist fracture was opened and done again. 11/28/2018 patient resting in bed complains of 10 out of 10 pain when moving.  She lives alone. Assessment & Plan:   Principal Problem:   Pelvis fracture ([place]) Active Problems:   Emphysema lung ([place])   Hypertension   Pelvic fracture ([place])   1. Acute displaced left iliac bone fracture with subacute left pubo-acetabular inferior pubic rami fracture -patient is on pain relief medications.  Continue tramadol.  Ortho consult pending.  Physical therapy consult.  Further recommendations per orthopedics.  2. Leukocytosis likely reactionary.    Resolved.  Patient is afebrile.  Chest x-ray unremarkable UA is pending. 3. Hypertension on amlodipine. 4. Emphysema not actively wheezing continue home  inhalers. 5. Chronic anemia stable 6. Recent left wrist fracture   Estimated body mass index is 15.78 kg/m as calculated from the following:   Height as of this encounter: 5\' 5"  (1.651 m).   Weight as of this encounter: 43 kg.  DVT prophylaxis: scd Code Status:full Family Communication:none Disposition Plan:pending pt may need placement   Consultants: ortho  Procedures:none Antimicrobials:  none Subjective: Complaints of pain when moving .Marland Kitchen  Objective: Vitals:   11/27/18 2208 11/28/18 0551 11/28/18 0952 11/28/18 1009  BP: 120/68 134/82  (!) 104/57  Pulse: 98 (!) 105  99  Resp: 16 14    Temp: 97.6 F (36.4 C) (!) 97.3 F (36.3 C)    TempSrc: Oral Oral    SpO2: 100% 99% 98% 100%  Weight:      Height:        Intake/Output Summary (Last 24 hours) at 11/28/2018 1122 Last data filed at 11/28/2018 1024 Gross per 24 hour  Intake 360 ml  Output -  Net 360 ml   Filed Weights   11/27/18 2030  Weight: 43 kg    Examination:  General exam: Appears calm and comfortable  Respiratory system: Clear to auscultation. Respiratory effort normal. Cardiovascular system: S1 & S2 heard, RRR. No JVD, murmurs, rubs, gallops or clicks. No pedal edema. Gastrointestinal system: Abdomen is nondistended, soft and nontender. No organomegaly or masses felt. Normal bowel sounds heard. Central nervous system: Alert and oriented. No focal neurological deficits. Extremities: Symmetric 5 x 5 power. Skin: No rashes, lesions or ulcers Psychiatry: Judgement and insight appear normal. Mood & affect appropriate.  Data Reviewed: I have personally reviewed following labs and imaging studies  CBC: Recent Labs  Lab 11/27/18 1716 11/28/18 0459  WBC 15.5* 10.2  NEUTROABS 13.6*  --   HGB 11.1* 10.2*  HCT 37.4 34.7*  MCV 100.5* 104.2*  PLT 314 629   Basic Metabolic Panel: Recent Labs  Lab 11/27/18 1716 11/28/18 0459  NA 139 138  K 4.1 4.0  CL 94* 96*  CO2 36* 33*  GLUCOSE 133*  105*  BUN 17 19  CREATININE 0.43* 0.36*  CALCIUM 9.1 8.8*   GFR: Estimated Creatinine Clearance: 32.4 mL/min (A) (by C-G formula based on SCr of 0.36 mg/dL (L)). Liver Function Tests: No results for input(s): AST, ALT, ALKPHOS, BILITOT, PROT, ALBUMIN in the last 168 hours. No results for input(s): LIPASE, AMYLASE in the last 168 hours. No results for input(s): AMMONIA in the last 168 hours. Coagulation Profile: No results for input(s): INR, PROTIME in the last 168 hours. Cardiac Enzymes: No results for input(s): CKTOTAL, CKMB, CKMBINDEX, TROPONINI in the last 168 hours. BNP (last 3 results) No results for input(s): PROBNP in the last 8760 hours. HbA1C: No results for input(s): HGBA1C in the last 72 hours. CBG: No results for input(s): GLUCAP in the last 168 hours. Lipid Profile: No results for input(s): CHOL, HDL, LDLCALC, TRIG, CHOLHDL, LDLDIRECT in the last 72 hours. Thyroid Function Tests: No results for input(s): TSH, T4TOTAL, FREET4, T3FREE, THYROIDAB in the last 72 hours. Anemia Panel: No results for input(s): VITAMINB12, FOLATE, FERRITIN, TIBC, IRON, RETICCTPCT in the last 72 hours. Sepsis Labs: No results for input(s): PROCALCITON, LATICACIDVEN in the last 168 hours.  No results found for this or any previous visit (from the past 240 hour(s)).       Radiology Studies: Dg Chest 1 View  Result Date: 11/27/2018 CLINICAL DATA:  Fall.  Hip pain. EXAM: CHEST  1 VIEW COMPARISON:  10/23/2018 FINDINGS: Atherosclerotic calcification of the aortic arch. Heart size within normal limits. Emphysema. Old granulomatous disease. Mild chronic interstitial accentuation. The patient is rotated to the bright on today's radiograph, reducing diagnostic sensitivity and specificity. IMPRESSION: 1. No acute findings. 2. Aortic Atherosclerosis (ICD10-I70.0) and Emphysema (ICD10-J43.9). 3. Old granulomatous disease. Electronically Signed   By: Van Clines M.D.   On: 11/27/2018 17:27   Ct  Head Wo Contrast  Result Date: 11/27/2018 CLINICAL DATA:  Fall, head trauma EXAM: CT HEAD WITHOUT CONTRAST TECHNIQUE: Contiguous axial images were obtained from the base of the skull through the vertex without intravenous contrast. COMPARISON:  None. FINDINGS: Brain: Mild atrophy. Chronic white matter changes. Negative for acute infarct, hemorrhage, mass Vascular: Atherosclerotic calcification. Negative for hyperdense vessel Skull: Negative for fracture Sinuses/Orbits: Paranasal sinuses clear.  Bilateral eye surgery. Other: None IMPRESSION: Atrophy and chronic microvascular ischemia.  No acute abnormality. Electronically Signed   By: Franchot Gallo M.D.   On: 11/27/2018 17:42   Ct Pelvis Wo Contrast  Result Date: 11/27/2018 CLINICAL DATA:  Left hip pain after fall. EXAM: CT PELVIS WITHOUT CONTRAST TECHNIQUE: Multidetector CT imaging of the pelvis was performed following the standard protocol without intravenous contrast. COMPARISON:  Pelvic radiograph from 10/21/2018 FINDINGS: Urinary Tract: The included kidneys and ureters are unremarkable. The urinary bladder is physiologically distended without rupture. Bowel: Moderate stool retention within the colon. Sigmoid diverticulosis is identified without acute diverticulitis. Vascular/Lymphatic: Moderate aortoiliac and branch vessel atherosclerosis. Reproductive:  The uterus and adnexa are nonacute. Other: The partially included gallbladder containing a few layering stones. Musculoskeletal: There is an acute ventral  and medially displaced fracture involving the ala and body of the left iliac bone. There is ventral displacement by approximately 11 mm of the main fracture fragment. The fracture extends to the cephalad margin the left SI joint, series 3/64. No pelvic diastasis. No involvement of the left acetabulum. Redemonstration of subacute left puboacetabular and inferior pubic rami fractures with evolving callus. Degenerative joint space narrowing of both hips  without dislocation. No joint effusion. Mild associated soft tissue induration compatible with posttraumatic contusions along the lateral aspect of the left hip and pelvis. IMPRESSION: 1. Acute ventral and medially displaced fracture of the left iliac bone with ventral displacement by approximately 11 mm. 2. Subacute left puboacetabular and inferior pubic rami fractures with evolving callus. 3. Sigmoid diverticulosis without acute diverticulitis. 4. Aortoiliac and branch vessel atherosclerosis. Electronically Signed   By: Ashley Royalty M.D.   On: 11/27/2018 18:50   Dg Hip Unilat With Pelvis 2-3 Views Left  Result Date: 11/27/2018 CLINICAL DATA:  Left hip pain, fall. EXAM: DG HIP (WITH OR WITHOUT PELVIS) 2-3V LEFT COMPARISON:  10/21/2018 FINDINGS: Bony demineralization. Disrupted iliopubic line favoring a fracture of the acetabulum or the lateral margin of the superior pubic ramus. Atherosclerosis noted. Possible fracture of the inferior pubic ramus on the left. Possible fracture the left pubic body. Such fractures are often accompanied by fractures of the sacrum. IMPRESSION: 1. Left pelvic fracture with disruption of the iliopubic line and possible involvement of the pubic body and inferior ramus on the left. These are often accompanied by fractures of the sacrum. I recommend dedicated CT of the pelvis for definitive characterization. 2. Atherosclerosis. 3. Bony demineralization. Electronically Signed   By: Van Clines M.D.   On: 11/27/2018 17:25        Scheduled Meds: . amLODipine  5 mg Oral Daily  . docusate sodium  100 mg Oral BID  . mirtazapine  7.5 mg Oral QHS  . mometasone-formoterol  2 puff Inhalation BID  . tiotropium  18 mcg Inhalation Daily   Continuous Infusions:   LOS: 1 day     Georgette Shell, MD [facility]  If 7PM-7AM, please contact night-coverage [email or web address] Password [email or web address] 11/28/2018, 11:22 AM

## 2018-11-29 DIAGNOSIS — J439 Emphysema, unspecified: Secondary | ICD-10-CM | POA: Diagnosis not present

## 2018-11-29 DIAGNOSIS — S32309A Unspecified fracture of unspecified ilium, initial encounter for closed fracture: Secondary | ICD-10-CM | POA: Diagnosis not present

## 2018-11-29 DIAGNOSIS — S32302A Unspecified fracture of left ilium, initial encounter for closed fracture: Secondary | ICD-10-CM | POA: Diagnosis not present

## 2018-11-29 DIAGNOSIS — S32810A Multiple fractures of pelvis with stable disruption of pelvic ring, initial encounter for closed fracture: Secondary | ICD-10-CM | POA: Diagnosis not present

## 2018-11-29 LAB — URINALYSIS, ROUTINE W REFLEX MICROSCOPIC
Glucose, UA: NEGATIVE mg/dL
HGB URINE DIPSTICK: NEGATIVE
Ketones, ur: 20 mg/dL — AB
Nitrite: NEGATIVE
Protein, ur: 30 mg/dL — AB
Specific Gravity, Urine: 1.034 — ABNORMAL HIGH (ref 1.005–1.030)
pH: 5 (ref 5.0–8.0)

## 2018-11-29 LAB — TSH: TSH: 1.274 u[IU]/mL (ref 0.350–4.500)

## 2018-11-29 LAB — PHOSPHORUS: Phosphorus: 3.5 mg/dL (ref 2.5–4.6)

## 2018-11-29 LAB — MAGNESIUM: Magnesium: 1.9 mg/dL (ref 1.7–2.4)

## 2018-11-29 MED ORDER — SODIUM CHLORIDE 0.9 % IV BOLUS
500.0000 mL | Freq: Once | INTRAVENOUS | Status: AC
  Administered 2018-11-29: 500 mL via INTRAVENOUS

## 2018-11-29 MED ORDER — SODIUM CHLORIDE 0.9 % IV SOLN
INTRAVENOUS | Status: DC
  Administered 2018-11-29 – 2018-11-30 (×2): via INTRAVENOUS

## 2018-11-29 NOTE — NC FL2 (Signed)
[place] LEVEL OF CARE SCREENING TOOL     IDENTIFICATION  Patient Name: Sheila Mcconnell Birthdate: 02-02-1927 Sex: female Admission Date (Current Location): 11/27/2018  [facility] and [place] Number:  Herbalist and Address:  [facility],  [address], [place]      Provider Number: [id number]  Attending Physician Name and Address:  Georgette Shell, MD  Relative Name and Phone Number:  [name] [phone]    Current Level of Care: Hospital Recommended Level of Care: [place] Prior Approval Number:    Date Approved/Denied:   PASRR Number: [id number]  Discharge Plan: SNF    Current Diagnoses: Patient Active Problem List   Diagnosis Date Noted  . Pelvis fracture ([place]) 11/27/2018  . Pelvic fracture ([place]) 11/27/2018  . Left wrist fracture 10/21/2018  . Wrist fracture 10/21/2018  . Malnutrition of moderate degree 07/05/2018  . Goals of care, counseling/discussion   . Palliative care encounter   . Emphysema of lung ([place]) 07/04/2018  . Dyspnea on exertion 07/04/2018  . Chronic hypercapnic respiratory failure ([place]) 07/04/2018  . Emphysema lung ([place]) 06/27/2017  . Oxygen dependent 06/27/2017  . Hypertension 06/27/2017    Orientation RESPIRATION BLADDER Height & Weight     Self, Time, Place  O2(2L) External catheter Weight: 94 lb 12.8 oz (43 kg) Height:  5\' 5"  (165.1 cm)  BEHAVIORAL SYMPTOMS/MOOD NEUROLOGICAL BOWEL NUTRITION STATUS      Continent Diet(See DC summary)  AMBULATORY STATUS COMMUNICATION OF NEEDS Skin   Extensive Assist Verbally Normal                       Personal Care Assistance Level of Assistance  Bathing, Feeding, Dressing Bathing Assistance: Limited assistance Feeding assistance: Independent Dressing Assistance: Limited assistance     Functional Limitations Info             SPECIAL CARE FACTORS FREQUENCY  PT (By licensed PT), OT (By licensed OT)     PT Frequency:  5 OT Frequency: 5            Contractures      Additional Factors Info  Code Status Code Status Info: DNR             Current Medications (11/29/2018):  This is the current hospital active medication list Current Facility-Administered Medications  Medication Dose Route Frequency Provider Last Rate Last Dose  . acetaminophen (TYLENOL) tablet 650 mg  650 mg Oral Q6H PRN Rise Patience, MD       Or  . acetaminophen (TYLENOL) suppository 650 mg  650 mg Rectal Q6H PRN Rise Patience, MD      . albuterol (PROVENTIL) (2.5 MG/3ML) 0.083% nebulizer solution 2.5 mg  2.5 mg Nebulization Q2H PRN Rise Patience, MD      . alum & mag hydroxide-simeth (MAALOX/MYLANTA) 200-200-20 MG/5ML suspension 15 mL  15 mL Oral Q4H PRN Georgette Shell, MD   15 mL at 11/28/18 1732  . amLODipine (NORVASC) tablet 5 mg  5 mg Oral Daily Rise Patience, MD   5 mg at 11/29/18 1035  . calcium citrate (CALCITRATE - dosed in mg elemental calcium) tablet 200 mg of elemental calcium  200 mg of elemental calcium Oral BID Ainsley Spinner, PA-C   200 mg of elemental calcium at 11/28/18 2111  . cholecalciferol (VITAMIN D3) tablet 2,000 Units  2,000 Units Oral BID Ainsley Spinner, PA-C   2,000 Units at 11/29/18 1034  .  docusate sodium (COLACE) capsule 100 mg  100 mg Oral BID Rise Patience, MD   100 mg at 11/29/18 1034  . fentaNYL (SUBLIMAZE) injection 12.5 mcg  12.5 mcg Intravenous Q2H PRN Rise Patience, MD   12.5 mcg at 11/28/18 1248  . HYDROcodone-acetaminophen (NORCO/VICODIN) 5-325 MG per tablet 1 tablet  1 tablet Oral Q6H PRN Ainsley Spinner, PA-C      . ketorolac (TORADOL) 15 MG/ML injection 7.5 mg  7.5 mg Intravenous Q8H Ainsley Spinner, PA-C   7.5 mg at 11/29/18 1035  . mirtazapine (REMERON) tablet 7.5 mg  7.5 mg Oral QHS Rise Patience, MD   7.5 mg at 11/28/18 2111  . mometasone-formoterol (DULERA) 200-5 MCG/ACT inhaler 2 puff  2 puff Inhalation BID Rise Patience, MD   2 puff at  11/29/18 0954  . ondansetron (ZOFRAN) tablet 4 mg  4 mg Oral Q6H PRN Rise Patience, MD       Or  . ondansetron [facility]) injection 4 mg  4 mg Intravenous Q6H PRN Rise Patience, MD      . tiotropium [facility]) inhalation capsule [facility] use ONLY) 18 mcg  18 mcg Inhalation Daily Rise Patience, MD   18 mcg at 11/29/18 [phone]  . traMADol (ULTRAM) tablet 25 mg  25 mg Oral Daily PRN Rise Patience, MD   25 mg at 11/28/18 2022  . vitamin C (ASCORBIC ACID) tablet 500 mg  500 mg Oral Daily Ainsley Spinner, PA-C   500 mg at 11/29/18 1035     Discharge Medications: Please see discharge summary for a list of discharge medications.  Relevant Imaging Results:  Relevant Lab Results:   Additional Information SSN: [id number]  Ollen Barges, LCSWA

## 2018-11-29 NOTE — Progress Notes (Signed)
OT Cancellation Note  Patient Details Name: Zyla Dascenzo MRN: [id number] DOB: 03/07/1927   Cancelled Treatment:    Reason Eval/Treat Not Completed: Other (comment).  Plan is for SNF. Will defer OT evaluation to that venue.  Verlyn Dannenberg 11/29/2018, 3:18 PM  Lesle Chris, OTR/L Acute Rehabilitation Services [phone] [facility] pager [phone] office 11/29/2018

## 2018-11-29 NOTE — Clinical Social Work Note (Signed)
Clinical Social Work Assessment  Patient Details  Name: Sheila Mcconnell MRN: [id number] Date of Birth: 09/03/1927  Date of referral:  11/29/18               Reason for consult:  Discharge Planning, Facility Placement                Permission sought to share information with:    Permission granted to share information::  Yes, Verbal Permission Granted  Name::     Sheila Mcconnell  Agency::     Relationship::  Daughter  Contact Information:  [phone]  Housing/Transportation Living arrangements for the past 2 months:  [place], [place] of Information:  Patient, Adult Children Patient Interpreter Needed:  None Criminal Activity/Legal Involvement Pertinent to Current Situation/Hospitalization:  No - Comment as needed Significant Relationships:  Adult Children Lives with:  Facility Resident Do you feel safe going back to the place where you live?  Yes Need for family participation in patient care:     Care giving concerns:  CSW consulted for assistance with discharge planning as patient is currently living at [facility] and PT is recommending SNF.  Social Worker assessment / plan:  CSW spoke with patient at bedside regarding current situation. When asked where patient currently lives, patient only responds that she lives in [place]. CSW asked if patient currently lived at [place] to which patient was not able to tell me. CSW mentioned SNF recommendation to which patient stated that "we'll see, I'll have to think about it". CSW aware patient's daughter has been involved in the past. CSW asked if patient would like CSW to reach out to patient's daughter for assistance. Patient initially declined. During assessment patient received phone call from daughter and was agreeable to having CSW speak with daughter.   CSW spoke with daughter over the phone who reports patient is from [place]. Per daughter, patient was at [facility] and discharged back to [facility] where she was only there for a day and fell. Per patient's daughter, she would like patient to go to [facility]. CSW to reach out to [facility] regarding open beds.   Employment status:  Retired Forensic scientist:  Managed Medicare([employer]) PT Recommendations:  [place] / Referral to community resources:  [place]  Patient/Family's Response to care:  Patient guarded throughout assessment. Patient initially declined to allow patient's daughter to be involved in care but after receiving call was agreeable. Patient states she may be agreeable to go back for more rehab.   Patient/Family's Understanding of and Emotional Response to Diagnosis, Current Treatment, and Prognosis:  Patient's daughter wanting patient to go to [facility]. CSW to speak with [facility].   Emotional Assessment Appearance:  Appears stated age Attitude/Demeanor/Rapport:  Apprehensive Affect (typically observed):  Guarded, Withdrawn, Calm Orientation:    Alcohol / Substance use:    Psych involvement (Current and /or in the community):     Discharge Needs  Concerns to be addressed:    Readmission within the last 30 days:  Yes Current discharge risk:  None Barriers to Discharge:  No Barriers Identified   Ollen Barges, [place] 11/29/2018, 11:00 AM

## 2018-11-29 NOTE — Progress Notes (Signed)
Pt unable to do ADL's herself. Pt has refused to brush her teeth or bathe today and is unable to do by herself. Pt requires max assist to move in bed or to get from bed to chair as well.

## 2018-11-29 NOTE — Progress Notes (Signed)
PROGRESS NOTE    Anselma Herbel  WEX:[id number] DOB: 1927/11/09 DOA: 11/27/2018 PCP: Ma Hillock, DO   Brief Narrative:84 y.o.femalewithhistory of hypertension, COPD, anemia who was recently admitted last month for left wrist fracture and eventually transferred to rehab when patient was just discharged from rehab and is presently living in assisted living facility had a fall. Patient states she was walking to the bathroom and suddenly she fell. Denies hitting her head or losing consciousness. Did not have any palpitations chest pain or shortness of breath.  ED Course:In the ER patient had a CAT scan of the pelvis which shows acute displaced left iliac bone fracture and also subacute pubo-acetabular and inferior pubic rami fracture. On-call orthopedic surgeon Dr. Mardelle Matte was consulted and orthopedic at this time feels patient will be managed conservatively. During the pain patient is being admitted for further management. Lab work showed leukocytosis patient is afebrile UA is pending. Chest x-ray was unremarkable. Patient had come to the ER 4 days ago after patient was complaining of left elbow pain and swelling at that time patient splint for the left wrist fracture was opened and done again. 11/28/2018 patient resting in bed complains of 10 out of 10 pain when moving.  She lives alone.  Assessment & Plan:   Principal Problem:   Pelvis fracture ([place]) Active Problems:   Emphysema lung ([place])   Hypertension   Pelvic fracture ([place])  2. Acute displaced left iliac wing bone fracture with subacute left pubo-acetabular inferior pubic rami fracture-patient is on pain relief medications.  Continue tramadol.  We will start her on Tylenol 500 mg 3 times a day standing dose.  She is weightbearing as tolerated to the left leg with a walker.  Platform walker to the left side with recent left distal fracture 6 weeks ago. 3. Leukocytosis likely reactionary.   Resolved.  Patient is afebrile.  Chest x-ray unremarkable UA is pending. 4. Hypertension on amlodipine. 5. Emphysema not actively wheezing continue home inhalers. 6. Chronic anemia-anemia of chronic disease stable 7. Recent left wrist fracture and pubic rami fractures on the left 6 weeks ago nonweightbearing to the left wrist.  Okay to weight-bear through elbow using a platform walker. 8. Vitamin D deficiency with severe osteoporosis-patient started on calcium and vitamin D.  Will need to follow-up with PCP as an outpatient for osteoporosis treatment.  As she has failed Boniva.  Estimated body mass index is 15.78 kg/m as calculated from the following:   Height as of this encounter: 5\' 5"  (1.651 m).   Weight as of this encounter: 43 kg.  DVT prophylaxis: SCD Code Status: Full code Family Communication: None Disposition Plan: Awaiting placement   Consultants: Ortho  Procedures: None Antimicrobials none Subjective:  Patient resting in bed complains of a lot of pain when moving Objective: Vitals:   11/28/18 2035 11/28/18 2054 11/29/18 0457 11/29/18 0954  BP:  104/60 116/65   Pulse:  96 84   Resp:  18 16   Temp:  (!) 97.4 F (36.3 C) 98 F (36.7 C)   TempSrc:  Oral Oral   SpO2: 99% 97% 98% 98%  Weight:      Height:        Intake/Output Summary (Last 24 hours) at 11/29/2018 1309 Last data filed at 11/29/2018 0900 Gross per 24 hour  Intake 890 ml  Output 200 ml  Net 690 ml   Filed Weights   11/27/18 2030  Weight: 43 kg    Examination:  General exam: Appears  calm and comfortable  Respiratory system: Clear to auscultation. Respiratory effort normal. Cardiovascular system: S1 & S2 heard, RRR. No JVD, murmurs, rubs, gallops or clicks. No pedal edema. Gastrointestinal system: Abdomen is nondistended, soft and nontender. No organomegaly or masses felt. Normal bowel sounds heard. Central nervous system: Alert and oriented. No focal neurological deficits. Extremities: Left upper extremity splint in  place. Skin: No rashes, lesions or ulcers Psychiatry: Judgement and insight appear normal. Mood & affect appropriate.     Data Reviewed: I have personally reviewed following labs and imaging studies  CBC: Recent Labs  Lab 11/27/18 1716 11/28/18 0459  WBC 15.5* 10.2  NEUTROABS 13.6*  --   HGB 11.1* 10.2*  HCT 37.4 34.7*  MCV 100.5* 104.2*  PLT 314 353   Basic Metabolic Panel: Recent Labs  Lab 11/27/18 1716 11/28/18 0459 11/29/18 0458  NA 139 138  --   K 4.1 4.0  --   CL 94* 96*  --   CO2 36* 33*  --   GLUCOSE 133* 105*  --   BUN 17 19  --   CREATININE 0.43* 0.36*  --   CALCIUM 9.1 8.8*  --   MG  --   --  1.9  PHOS  --   --  3.5   GFR: Estimated Creatinine Clearance: 32.4 mL/min (A) (by C-G formula based on SCr of 0.36 mg/dL (L)). Liver Function Tests: No results for input(s): AST, ALT, ALKPHOS, BILITOT, PROT, ALBUMIN in the last 168 hours. No results for input(s): LIPASE, AMYLASE in the last 168 hours. No results for input(s): AMMONIA in the last 168 hours. Coagulation Profile: No results for input(s): INR, PROTIME in the last 168 hours. Cardiac Enzymes: No results for input(s): CKTOTAL, CKMB, CKMBINDEX, TROPONINI in the last 168 hours. BNP (last 3 results) No results for input(s): PROBNP in the last 8760 hours. HbA1C: No results for input(s): HGBA1C in the last 72 hours. CBG: No results for input(s): GLUCAP in the last 168 hours. Lipid Profile: No results for input(s): CHOL, HDL, LDLCALC, TRIG, CHOLHDL, LDLDIRECT in the last 72 hours. Thyroid Function Tests: Recent Labs    11/29/18 0458  TSH 1.274   Anemia Panel: No results for input(s): VITAMINB12, FOLATE, FERRITIN, TIBC, IRON, RETICCTPCT in the last 72 hours. Sepsis Labs: No results for input(s): PROCALCITON, LATICACIDVEN in the last 168 hours.  No results found for this or any previous visit (from the past 240 hour(s)).       Radiology Studies: Dg Chest 1 View  Result Date:  11/27/2018 CLINICAL DATA:  Fall.  Hip pain. EXAM: CHEST  1 VIEW COMPARISON:  10/23/2018 FINDINGS: Atherosclerotic calcification of the aortic arch. Heart size within normal limits. Emphysema. Old granulomatous disease. Mild chronic interstitial accentuation. The patient is rotated to the bright on today's radiograph, reducing diagnostic sensitivity and specificity. IMPRESSION: 1. No acute findings. 2. Aortic Atherosclerosis (ICD10-I70.0) and Emphysema (ICD10-J43.9). 3. Old granulomatous disease. Electronically Signed   By: Van Clines M.D.   On: 11/27/2018 17:27   Ct Head Wo Contrast  Result Date: 11/27/2018 CLINICAL DATA:  Fall, head trauma EXAM: CT HEAD WITHOUT CONTRAST TECHNIQUE: Contiguous axial images were obtained from the base of the skull through the vertex without intravenous contrast. COMPARISON:  None. FINDINGS: Brain: Mild atrophy. Chronic white matter changes. Negative for acute infarct, hemorrhage, mass Vascular: Atherosclerotic calcification. Negative for hyperdense vessel Skull: Negative for fracture Sinuses/Orbits: Paranasal sinuses clear.  Bilateral eye surgery. Other: None IMPRESSION: Atrophy and chronic microvascular ischemia.  No acute abnormality. Electronically Signed   By: Franchot Gallo M.D.   On: 11/27/2018 17:42   Ct Pelvis Wo Contrast  Result Date: 11/27/2018 CLINICAL DATA:  Left hip pain after fall. EXAM: CT PELVIS WITHOUT CONTRAST TECHNIQUE: Multidetector CT imaging of the pelvis was performed following the standard protocol without intravenous contrast. COMPARISON:  Pelvic radiograph from 10/21/2018 FINDINGS: Urinary Tract: The included kidneys and ureters are unremarkable. The urinary bladder is physiologically distended without rupture. Bowel: Moderate stool retention within the colon. Sigmoid diverticulosis is identified without acute diverticulitis. Vascular/Lymphatic: Moderate aortoiliac and branch vessel atherosclerosis. Reproductive:  The uterus and adnexa are  nonacute. Other: The partially included gallbladder containing a few layering stones. Musculoskeletal: There is an acute ventral and medially displaced fracture involving the ala and body of the left iliac bone. There is ventral displacement by approximately 11 mm of the main fracture fragment. The fracture extends to the cephalad margin the left SI joint, series 3/64. No pelvic diastasis. No involvement of the left acetabulum. Redemonstration of subacute left puboacetabular and inferior pubic rami fractures with evolving callus. Degenerative joint space narrowing of both hips without dislocation. No joint effusion. Mild associated soft tissue induration compatible with posttraumatic contusions along the lateral aspect of the left hip and pelvis. IMPRESSION: 1. Acute ventral and medially displaced fracture of the left iliac bone with ventral displacement by approximately 11 mm. 2. Subacute left puboacetabular and inferior pubic rami fractures with evolving callus. 3. Sigmoid diverticulosis without acute diverticulitis. 4. Aortoiliac and branch vessel atherosclerosis. Electronically Signed   By: Ashley Royalty M.D.   On: 11/27/2018 18:50   Dg Hip Unilat With Pelvis 2-3 Views Left  Result Date: 11/27/2018 CLINICAL DATA:  Left hip pain, fall. EXAM: DG HIP (WITH OR WITHOUT PELVIS) 2-3V LEFT COMPARISON:  10/21/2018 FINDINGS: Bony demineralization. Disrupted iliopubic line favoring a fracture of the acetabulum or the lateral margin of the superior pubic ramus. Atherosclerosis noted. Possible fracture of the inferior pubic ramus on the left. Possible fracture the left pubic body. Such fractures are often accompanied by fractures of the sacrum. IMPRESSION: 1. Left pelvic fracture with disruption of the iliopubic line and possible involvement of the pubic body and inferior ramus on the left. These are often accompanied by fractures of the sacrum. I recommend dedicated CT of the pelvis for definitive characterization. 2.  Atherosclerosis. 3. Bony demineralization. Electronically Signed   By: Van Clines M.D.   On: 11/27/2018 17:25        Scheduled Meds: . amLODipine  5 mg Oral Daily  . calcium citrate  200 mg of elemental calcium Oral BID  . cholecalciferol  2,000 Units Oral BID  . docusate sodium  100 mg Oral BID  . ketorolac  7.5 mg Intravenous Q8H  . mirtazapine  7.5 mg Oral QHS  . mometasone-formoterol  2 puff Inhalation BID  . tiotropium  18 mcg Inhalation Daily  . vitamin C  500 mg Oral Daily   Continuous Infusions:   LOS: 2 days      Georgette Shell, MD [facility]  If 7PM-7AM, please contact night-coverage [email or web address] Password [email or web address] 11/29/2018, 1:09 PM

## 2018-11-30 DIAGNOSIS — K219 Gastro-esophageal reflux disease without esophagitis: Secondary | ICD-10-CM | POA: Diagnosis present

## 2018-11-30 DIAGNOSIS — Z9981 Dependence on supplemental oxygen: Secondary | ICD-10-CM | POA: Diagnosis not present

## 2018-11-30 DIAGNOSIS — R2681 Unsteadiness on feet: Secondary | ICD-10-CM | POA: Diagnosis not present

## 2018-11-30 DIAGNOSIS — Z9181 History of falling: Secondary | ICD-10-CM | POA: Diagnosis not present

## 2018-11-30 DIAGNOSIS — S329XXA Fracture of unspecified parts of lumbosacral spine and pelvis, initial encounter for closed fracture: Secondary | ICD-10-CM | POA: Diagnosis not present

## 2018-11-30 DIAGNOSIS — R05 Cough: Secondary | ICD-10-CM | POA: Diagnosis not present

## 2018-11-30 DIAGNOSIS — Z833 Family history of diabetes mellitus: Secondary | ICD-10-CM | POA: Diagnosis not present

## 2018-11-30 DIAGNOSIS — Z882 Allergy status to sulfonamides status: Secondary | ICD-10-CM | POA: Diagnosis not present

## 2018-11-30 DIAGNOSIS — J438 Other emphysema: Secondary | ICD-10-CM | POA: Diagnosis not present

## 2018-11-30 DIAGNOSIS — S32302A Unspecified fracture of left ilium, initial encounter for closed fracture: Secondary | ICD-10-CM | POA: Diagnosis not present

## 2018-11-30 DIAGNOSIS — S62102D Fracture of unspecified carpal bone, left wrist, subsequent encounter for fracture with routine healing: Secondary | ICD-10-CM | POA: Diagnosis not present

## 2018-11-30 DIAGNOSIS — E568 Deficiency of other vitamins: Secondary | ICD-10-CM | POA: Diagnosis not present

## 2018-11-30 DIAGNOSIS — S32810A Multiple fractures of pelvis with stable disruption of pelvic ring, initial encounter for closed fracture: Secondary | ICD-10-CM | POA: Diagnosis present

## 2018-11-30 DIAGNOSIS — Z66 Do not resuscitate: Secondary | ICD-10-CM | POA: Diagnosis present

## 2018-11-30 DIAGNOSIS — Z807 Family history of other malignant neoplasms of lymphoid, hematopoietic and related tissues: Secondary | ICD-10-CM | POA: Diagnosis not present

## 2018-11-30 DIAGNOSIS — M6281 Muscle weakness (generalized): Secondary | ICD-10-CM | POA: Diagnosis not present

## 2018-11-30 DIAGNOSIS — R0689 Other abnormalities of breathing: Secondary | ICD-10-CM | POA: Diagnosis not present

## 2018-11-30 DIAGNOSIS — R2689 Other abnormalities of gait and mobility: Secondary | ICD-10-CM | POA: Diagnosis not present

## 2018-11-30 DIAGNOSIS — R41841 Cognitive communication deficit: Secondary | ICD-10-CM | POA: Diagnosis not present

## 2018-11-30 DIAGNOSIS — R079 Chest pain, unspecified: Secondary | ICD-10-CM | POA: Diagnosis not present

## 2018-11-30 DIAGNOSIS — S329XXD Fracture of unspecified parts of lumbosacral spine and pelvis, subsequent encounter for fracture with routine healing: Secondary | ICD-10-CM | POA: Diagnosis not present

## 2018-11-30 DIAGNOSIS — I10 Essential (primary) hypertension: Secondary | ICD-10-CM | POA: Diagnosis present

## 2018-11-30 DIAGNOSIS — E440 Moderate protein-calorie malnutrition: Secondary | ICD-10-CM | POA: Diagnosis present

## 2018-11-30 DIAGNOSIS — R52 Pain, unspecified: Secondary | ICD-10-CM | POA: Diagnosis not present

## 2018-11-30 DIAGNOSIS — J9612 Chronic respiratory failure with hypercapnia: Secondary | ICD-10-CM | POA: Diagnosis present

## 2018-11-30 DIAGNOSIS — Z87891 Personal history of nicotine dependence: Secondary | ICD-10-CM | POA: Diagnosis not present

## 2018-11-30 DIAGNOSIS — J439 Emphysema, unspecified: Secondary | ICD-10-CM | POA: Diagnosis not present

## 2018-11-30 DIAGNOSIS — M810 Age-related osteoporosis without current pathological fracture: Secondary | ICD-10-CM | POA: Diagnosis present

## 2018-11-30 DIAGNOSIS — R278 Other lack of coordination: Secondary | ICD-10-CM | POA: Diagnosis not present

## 2018-11-30 DIAGNOSIS — J441 Chronic obstructive pulmonary disease with (acute) exacerbation: Secondary | ICD-10-CM | POA: Diagnosis present

## 2018-11-30 DIAGNOSIS — S51811A Laceration without foreign body of right forearm, initial encounter: Secondary | ICD-10-CM | POA: Diagnosis present

## 2018-11-30 DIAGNOSIS — R0602 Shortness of breath: Secondary | ICD-10-CM | POA: Diagnosis not present

## 2018-11-30 DIAGNOSIS — D72829 Elevated white blood cell count, unspecified: Secondary | ICD-10-CM | POA: Diagnosis not present

## 2018-11-30 DIAGNOSIS — J449 Chronic obstructive pulmonary disease, unspecified: Secondary | ICD-10-CM | POA: Diagnosis not present

## 2018-11-30 DIAGNOSIS — S329XXS Fracture of unspecified parts of lumbosacral spine and pelvis, sequela: Secondary | ICD-10-CM | POA: Diagnosis not present

## 2018-11-30 DIAGNOSIS — S32309A Unspecified fracture of unspecified ilium, initial encounter for closed fracture: Secondary | ICD-10-CM | POA: Diagnosis not present

## 2018-11-30 DIAGNOSIS — R000 Tachycardia, unspecified: Secondary | ICD-10-CM | POA: Diagnosis not present

## 2018-11-30 LAB — PTH, INTACT AND CALCIUM
Calcium, Total (PTH): 8.7 mg/dL (ref 8.7–10.3)
PTH: 15 pg/mL (ref 15–65)

## 2018-11-30 LAB — CALCITRIOL (1,25 DI-OH VIT D): Vit D, 1,25-Dihydroxy: 22.2 pg/mL (ref 19.9–79.3)

## 2018-11-30 LAB — CALCIUM, IONIZED: Calcium, Ionized, Serum: 5.1 mg/dL (ref 4.5–5.6)

## 2018-11-30 MED ORDER — CALCIUM CITRATE 950 (200 CA) MG PO TABS: 200.0000 mg | ORAL_TABLET | Freq: Two times a day (BID) | ORAL | Status: DC

## 2018-11-30 MED ORDER — ASCORBIC ACID 500 MG PO TABS: 500.0000 mg | ORAL_TABLET | Freq: Every day | ORAL | Status: AC

## 2018-11-30 MED ORDER — VITAMIN D 1000 UNITS PO TABS: 2000.0000 [IU] | ORAL_TABLET | Freq: Two times a day (BID) | ORAL | Status: AC

## 2018-11-30 MED ORDER — TRAMADOL HCL 50 MG PO TABS: 25.0000 mg | ORAL_TABLET | Freq: Every day | ORAL | 0 refills | Status: AC | PRN

## 2018-11-30 NOTE — Progress Notes (Signed)
Patient with decreased urine output, taking small sips of water, refused evening meal, bladder scan residual 138 ml. On call [facility] physician notified, received orders for NS bolus, RN will continue to monitor patient, [place] Ivadell Gaul RN 11/29/2018 0035.

## 2018-11-30 NOTE — Progress Notes (Signed)
Call to [facility] to attempt to give report. Operator transferred call to nursing station but no one answered phone. Phone rang and rang and cut off.

## 2018-11-30 NOTE — Clinical Social Work Placement (Signed)
   11:50 AM Patient has been accepted to [facility] for today 12/27. CSW has faxed patient's DC summary to facility. CSW spoke with patient's family who are agreeable to plan. CSW has set up PTAR to transfer patient at 3:00. CSW has updated patient's RN. Please call report to [phone].   CLINICAL SOCIAL WORK PLACEMENT  NOTE  Date:  11/30/2018  Patient Details  Name: Sheila Mcconnell MRN: [id number] Date of Birth: Nov 01, 1927  Clinical Social Work is seeking post-discharge placement for this patient at the [place] level of care (*CSW will initial, date and re-position this form in  chart as items are completed):  Yes   Patient/family provided with [place] Work Department's list of facilities offering this level of care within the geographic area requested by the patient (or if unable, by the patient's family).  Yes   Patient/family informed of their freedom to choose among providers that offer the needed level of care, that participate in Medicare, Medicaid or managed care program needed by the patient, have an available bed and are willing to accept the patient.  Yes   Patient/family informed of [facility]'s ownership interest in [facility] and [facility], as well as of the fact that they are under no obligation to receive care at these facilities.  PASRR submitted to EDS on       PASRR number received on 11/29/18     Existing PASRR number confirmed on       FL2 transmitted to all facilities in geographic area requested by pt/family on 11/29/18     FL2 transmitted to all facilities within larger geographic area on       Patient informed that his/her managed care company has contracts with or will negotiate with certain facilities, including the following:        Yes   Patient/family informed of bed offers received.  Patient chooses bed at [facility]     Physician recommends and patient chooses bed at      Patient to be  transferred to [facility] on 11/30/18.  Patient to be transferred to facility by EMS     Patient family notified on 11/30/18 of transfer.  Name of family member notified:  [facility]      PHYSICIAN       Additional Comment:    _______________________________________________ Ollen Barges, LCSWA 11/30/2018, 11:50 AM

## 2018-11-30 NOTE — Discharge Summary (Signed)
Physician Discharge Summary  Sheila Mcconnell PZW:[id number] DOB: 07-04-1927 DOA: 11/27/2018  PCP: Ma Hillock, DO  Admit date: 11/27/2018 Discharge date: 11/30/2018  Admitted From: Home Disposition: Nursing home Recommendations for Outpatient Follow-up:  1. Follow up with PCP in 1-2 weeks 2. Please obtain BMP/CBC in one week 3 follow-up with Ortho Dr. Doreatha Martin  Home Health: None Equipment/Devices: None Discharge Condition: Stable and improved CODE STATUS DO NOT RESUSCITATE Diet recommendation: Cardiac Brief/Interim Summary:84 y.o.femalewithhistory of hypertension, COPD, anemia who was recently admitted last month for left wrist fracture and eventually transferred to rehab when patient was just discharged from rehab and is presently living in assisted living facility had a fall. Patient states she was walking to the bathroom and suddenly she fell. Denies hitting her head or losing consciousness. Did not have any palpitations chest pain or shortness of breath.  ED Course:In the ER patient had a CAT scan of the pelvis which shows acute displaced left iliac bone fracture and also subacute pubo-acetabular and inferior pubic rami fracture. On-call orthopedic surgeon Dr. Mardelle Matte was consulted and orthopedic at this time feels patient will be managed conservatively. During the pain patient is being admitted for further management. Lab work showed leukocytosis patient is afebrile UA is pending. Chest x-ray was unremarkable. Patient had come to the ER 4 days ago after patient was complaining of left elbow pain and swelling at that time patient splint for the left wrist fracture was opened and done again. 11/28/2018 patient resting in bed complains of 10 out of 10 pain when moving. She lives alone.  Discharge Diagnoses:  Principal Problem:   Pelvis fracture ([place]) Active Problems:   Emphysema lung ([place])   Hypertension   Pelvic fracture ([facility])   Acute displaced left iliac wing bone  fracture with subacute left pubo-acetabular inferior pubic rami fracture--Continue tramadol.  We will start her on Tylenol 500 mg 3 times a day standing dose.  She is weightbearing as tolerated to the left leg with a walker.  Platform walker to the left side with recent left distal fracture 6 weeks ago.  Follow-up with Ortho  Leukocytosis likely reactionary.Resolved. Patient is afebrile.  Chest x-ray unremarkable UA is pending.  Hypertension was on amlodipine.  I have stopped at the time of discharge due to soft blood pressure.  Emphysema not actively wheezing continue home inhalers.  Chronic anemia-anemia of chronic diseasestable  Recent left wristfracture and pubic rami fractures on the left 6 weeks ago nonweightbearing to the left wrist.  Okay to weight-bear through elbow using a platform walker.  Vitamin D deficiency with severe osteoporosis-patient started on calcium and vitamin D.  Will need to follow-up with PCP as an outpatient for osteoporosis treatment.  As she has failed Boniva.  Estimated body mass index is 15.78 kg/m as calculated from the following:   Height as of this encounter: 5\' 5"  (1.651 m).   Weight as of this encounter: 43 kg.  Discharge Instructions  Discharge Instructions    Call MD for:  persistant dizziness or light-headedness   Complete by:  As directed    Call MD for:  persistant nausea and vomiting   Complete by:  As directed    Call MD for:  severe uncontrolled pain   Complete by:  As directed    Call MD for:  temperature >100.4   Complete by:  As directed    Diet - low sodium heart healthy   Complete by:  As directed    Increase activity slowly  Complete by:  As directed      Allergies as of 11/30/2018      Reactions   Sulfa Antibiotics Nausea Only      Medication List    STOP taking these medications   amLODipine 5 MG tablet Commonly known as:  NORVASC   budesonide-formoterol 160-4.5 MCG/ACT inhaler Commonly known as:   SYMBICORT   furosemide 20 MG tablet Commonly known as:  LASIX     TAKE these medications   ascorbic acid 500 MG tablet Commonly known as:  VITAMIN C Take 1 tablet (500 mg total) by mouth daily. Start taking on:  December 01, 2018   calcium citrate 950 MG tablet Commonly known as:  CALCITRATE - dosed in mg elemental calcium Take 1 tablet (200 mg of elemental calcium total) by mouth 2 (two) times daily.   CHLORASEPTIC SORE THROAT PO Take by mouth daily as needed.   acetaminophen 500 MG tablet Commonly known as:  TYLENOL Take 500 mg by mouth every 6 (six) hours as needed for moderate pain.   cholecalciferol 1000 units tablet Commonly known as:  VITAMIN D Take 2 tablets (2,000 Units total) by mouth 2 (two) times daily.   docusate sodium 100 MG capsule Commonly known as:  COLACE Take 1 capsule (100 mg total) by mouth 2 (two) times daily.   ENSURE PO Take 1 Can by mouth 2 (two) times daily.   Fluticasone-Salmeterol 250-50 MCG/DOSE Aepb Commonly known as:  ADVAIR Inhale 1 puff into the lungs 2 (two) times daily.   ipratropium 17 MCG/ACT inhaler Commonly known as:  ATROVENT HFA Inhale 2 puffs into the lungs every 6 (six) hours.   ipratropium-albuterol 0.5-2.5 (3) MG/3ML Soln Commonly known as:  DUONEB Take 3 mLs by nebulization 3 (three) times daily. What changed:    when to take this  reasons to take this   mirtazapine 15 MG tablet Commonly known as:  REMERON Take 7.5 mg by mouth at bedtime.   ROBITUSSIN DM PO Take 15 mLs by mouth every 4 (four) hours as needed (cough).   tiotropium 18 MCG inhalation capsule Commonly known as:  SPIRIVA Place 1 capsule (18 mcg total) into inhaler and inhale daily.   traMADol 50 MG tablet Commonly known as:  ULTRAM Take 0.5 tablets (25 mg total) by mouth daily as needed for up to 7 days for moderate pain.      Follow-up Information    Kuneff, Renee A, DO Follow up.   Specialty:  Family Medicine Contact  information: [address] [place] [address] [phone]          Allergies  Allergen Reactions  . Sulfa Antibiotics Nausea Only    Consultations: ORTHO  Procedures/Studies: Dg Chest 1 View  Result Date: 11/27/2018 CLINICAL DATA:  Fall.  Hip pain. EXAM: CHEST  1 VIEW COMPARISON:  10/23/2018 FINDINGS: Atherosclerotic calcification of the aortic arch. Heart size within normal limits. Emphysema. Old granulomatous disease. Mild chronic interstitial accentuation. The patient is rotated to the bright on today's radiograph, reducing diagnostic sensitivity and specificity. IMPRESSION: 1. No acute findings. 2. Aortic Atherosclerosis (ICD10-I70.0) and Emphysema (ICD10-J43.9). 3. Old granulomatous disease. Electronically Signed   By: Van Clines M.D.   On: 11/27/2018 17:27   Dg Elbow Complete Left  Result Date: 11/24/2018 CLINICAL DATA:  Acute onset of left forearm discomfort and finger tingling. Left elbow swelling. EXAM: LEFT ELBOW - COMPLETE 3+ VIEW COMPARISON:  Left forearm radiographs performed 10/23/2018 FINDINGS: There is no definite evidence of  fracture or dislocation. Marked soft tissue swelling is noted about the elbow joint. There is question of an elbow joint effusion. Visualized joint spaces are grossly preserved. No radiopaque foreign bodies are identified. IMPRESSION: No definite evidence of fracture or dislocation. Marked soft tissue swelling noted about the elbow joint. Question of elbow joint effusion. Electronically Signed   By: Garald Balding M.D.   On: 11/24/2018 00:14   Ct Head Wo Contrast  Result Date: 11/27/2018 CLINICAL DATA:  Fall, head trauma EXAM: CT HEAD WITHOUT CONTRAST TECHNIQUE: Contiguous axial images were obtained from the base of the skull through the vertex without intravenous contrast. COMPARISON:  None. FINDINGS: Brain: Mild atrophy. Chronic white matter changes. Negative for acute infarct, hemorrhage, mass Vascular: Atherosclerotic calcification.  Negative for hyperdense vessel Skull: Negative for fracture Sinuses/Orbits: Paranasal sinuses clear.  Bilateral eye surgery. Other: None IMPRESSION: Atrophy and chronic microvascular ischemia.  No acute abnormality. Electronically Signed   By: Franchot Gallo M.D.   On: 11/27/2018 17:42   Ct Pelvis Wo Contrast  Result Date: 11/27/2018 CLINICAL DATA:  Left hip pain after fall. EXAM: CT PELVIS WITHOUT CONTRAST TECHNIQUE: Multidetector CT imaging of the pelvis was performed following the standard protocol without intravenous contrast. COMPARISON:  Pelvic radiograph from 10/21/2018 FINDINGS: Urinary Tract: The included kidneys and ureters are unremarkable. The urinary bladder is physiologically distended without rupture. Bowel: Moderate stool retention within the colon. Sigmoid diverticulosis is identified without acute diverticulitis. Vascular/Lymphatic: Moderate aortoiliac and branch vessel atherosclerosis. Reproductive:  The uterus and adnexa are nonacute. Other: The partially included gallbladder containing a few layering stones. Musculoskeletal: There is an acute ventral and medially displaced fracture involving the ala and body of the left iliac bone. There is ventral displacement by approximately 11 mm of the main fracture fragment. The fracture extends to the cephalad margin the left SI joint, series 3/64. No pelvic diastasis. No involvement of the left acetabulum. Redemonstration of subacute left puboacetabular and inferior pubic rami fractures with evolving callus. Degenerative joint space narrowing of both hips without dislocation. No joint effusion. Mild associated soft tissue induration compatible with posttraumatic contusions along the lateral aspect of the left hip and pelvis. IMPRESSION: 1. Acute ventral and medially displaced fracture of the left iliac bone with ventral displacement by approximately 11 mm. 2. Subacute left puboacetabular and inferior pubic rami fractures with evolving callus. 3.  Sigmoid diverticulosis without acute diverticulitis. 4. Aortoiliac and branch vessel atherosclerosis. Electronically Signed   By: Ashley Royalty M.D.   On: 11/27/2018 18:50   Dg Hip Unilat With Pelvis 2-3 Views Left  Result Date: 11/27/2018 CLINICAL DATA:  Left hip pain, fall. EXAM: DG HIP (WITH OR WITHOUT PELVIS) 2-3V LEFT COMPARISON:  10/21/2018 FINDINGS: Bony demineralization. Disrupted iliopubic line favoring a fracture of the acetabulum or the lateral margin of the superior pubic ramus. Atherosclerosis noted. Possible fracture of the inferior pubic ramus on the left. Possible fracture the left pubic body. Such fractures are often accompanied by fractures of the sacrum. IMPRESSION: 1. Left pelvic fracture with disruption of the iliopubic line and possible involvement of the pubic body and inferior ramus on the left. These are often accompanied by fractures of the sacrum. I recommend dedicated CT of the pelvis for definitive characterization. 2. Atherosclerosis. 3. Bony demineralization. Electronically Signed   By: Van Clines M.D.   On: 11/27/2018 17:25   (Echo, Carotid, EGD, Colonoscopy, ERCP)    Subjective:   Discharge Exam: Vitals:   11/30/18 0633 11/30/18 1006  BP: 117/61  Pulse: 86   Resp: 18   Temp: 98.1 F (36.7 C)   SpO2: 97% 98%   Vitals:   11/29/18 2046 11/29/18 2239 11/30/18 0633 11/30/18 1006  BP:  113/62 117/61   Pulse:  81 86   Resp:  20 18   Temp:  97.8 F (36.6 C) 98.1 F (36.7 C)   TempSrc:  Oral Oral   SpO2: 97% 97% 97% 98%  Weight:      Height:        General: Pt is alert, awake, not in acute distress Cardiovascular: RRR, S1/S2 +, no rubs, no gallops Respiratory: CTA bilaterally, no wheezing, no rhonchi Abdominal: Soft, NT, ND, bowel sounds + Extremities: no edema, no cyanosis    The results of significant diagnostics from this hospitalization (including imaging, microbiology, ancillary and laboratory) are listed below for reference.      Microbiology: No results found for this or any previous visit (from the past 240 hour(s)).   Labs: BNP (last 3 results) Recent Labs    06/04/18 1358 07/04/18 0014  BNP 24.4 23.5   Basic Metabolic Panel: Recent Labs  Lab 11/27/18 1716 11/28/18 0459 11/29/18 0458  NA 139 138  --   K 4.1 4.0  --   CL 94* 96*  --   CO2 36* 33*  --   GLUCOSE 133* 105*  --   BUN 17 19  --   CREATININE 0.43* 0.36*  --   CALCIUM 9.1 8.8* 8.7  MG  --   --  1.9  PHOS  --   --  3.5   Liver Function Tests: No results for input(s): AST, ALT, ALKPHOS, BILITOT, PROT, ALBUMIN in the last 168 hours. No results for input(s): LIPASE, AMYLASE in the last 168 hours. No results for input(s): AMMONIA in the last 168 hours. CBC: Recent Labs  Lab 11/27/18 1716 11/28/18 0459  WBC 15.5* 10.2  NEUTROABS 13.6*  --   HGB 11.1* 10.2*  HCT 37.4 34.7*  MCV 100.5* 104.2*  PLT 314 240   Cardiac Enzymes: No results for input(s): CKTOTAL, CKMB, CKMBINDEX, TROPONINI in the last 168 hours. BNP: Invalid input(s): POCBNP CBG: No results for input(s): GLUCAP in the last 168 hours. D-Dimer No results for input(s): DDIMER in the last 72 hours. Hgb A1c No results for input(s): HGBA1C in the last 72 hours. Lipid Profile No results for input(s): CHOL, HDL, LDLCALC, TRIG, CHOLHDL, LDLDIRECT in the last 72 hours. Thyroid function studies Recent Labs    11/29/18 0458  TSH 1.274   Anemia work up No results for input(s): VITAMINB12, FOLATE, FERRITIN, TIBC, IRON, RETICCTPCT in the last 72 hours. Urinalysis    Component Value Date/Time   COLORURINE AMBER (A) 11/29/2018 0922   APPEARANCEUR CLOUDY (A) 11/29/2018 0922   LABSPEC 1.034 (H) 11/29/2018 0922   PHURINE 5.0 11/29/2018 0922   GLUCOSEU NEGATIVE 11/29/2018 0922   HGBUR NEGATIVE 11/29/2018 0922   BILIRUBINUR SMALL (A) 11/29/2018 0922   KETONESUR 20 (A) 11/29/2018 0922   PROTEINUR 30 (A) 11/29/2018 0922   NITRITE NEGATIVE 11/29/2018 0922   LEUKOCYTESUR  TRACE (A) 11/29/2018 0922   Sepsis Labs Invalid input(s): PROCALCITONIN,  WBC,  LACTICIDVEN Microbiology No results found for this or any previous visit (from the past 240 hour(s)).   Time coordinating discharge:  34 minutes  SIGNED:   Georgette Shell, MD  [facility] 11/30/2018, 10:48 AM Pager   If 7PM-7AM, please contact night-coverage [email or web address] Password [email or web address]

## 2018-11-30 NOTE — Care Management Important Message (Signed)
Important Message  Patient Details  Name: Sheila Mcconnell MRN: [id number] Date of Birth: 18-Jul-1927   Medicare Important Message Given:  Yes    Ryen Heitmeyer 11/30/2018, 8:59 AM

## 2018-12-06 DIAGNOSIS — S329XXD Fracture of unspecified parts of lumbosacral spine and pelvis, subsequent encounter for fracture with routine healing: Secondary | ICD-10-CM | POA: Diagnosis not present

## 2018-12-06 DIAGNOSIS — D72829 Elevated white blood cell count, unspecified: Secondary | ICD-10-CM | POA: Diagnosis not present

## 2018-12-06 DIAGNOSIS — Z9981 Dependence on supplemental oxygen: Secondary | ICD-10-CM | POA: Diagnosis not present

## 2018-12-06 DIAGNOSIS — J449 Chronic obstructive pulmonary disease, unspecified: Secondary | ICD-10-CM | POA: Diagnosis not present

## 2018-12-10 ENCOUNTER — Encounter (HOSPITAL_COMMUNITY): Payer: Self-pay | Admitting: Internal Medicine

## 2018-12-10 ENCOUNTER — Inpatient Hospital Stay (HOSPITAL_COMMUNITY)
Admission: EM | Admit: 2018-12-10 | Discharge: 2018-12-13 | DRG: 191 | Disposition: A | Discharge status: Rehabilitation Facility | Payer: Medicare Other | Attending: Internal Medicine | Admitting: Internal Medicine

## 2018-12-10 ENCOUNTER — Emergency Department (HOSPITAL_COMMUNITY): Payer: Medicare Other

## 2018-12-10 DIAGNOSIS — E568 Deficiency of other vitamins: Secondary | ICD-10-CM | POA: Diagnosis not present

## 2018-12-10 DIAGNOSIS — J9612 Chronic respiratory failure with hypercapnia: Secondary | ICD-10-CM | POA: Diagnosis present

## 2018-12-10 DIAGNOSIS — S32309A Unspecified fracture of unspecified ilium, initial encounter for closed fracture: Secondary | ICD-10-CM | POA: Diagnosis not present

## 2018-12-10 DIAGNOSIS — T148XXA Other injury of unspecified body region, initial encounter: Secondary | ICD-10-CM

## 2018-12-10 DIAGNOSIS — R079 Chest pain, unspecified: Secondary | ICD-10-CM | POA: Diagnosis not present

## 2018-12-10 DIAGNOSIS — Z833 Family history of diabetes mellitus: Secondary | ICD-10-CM

## 2018-12-10 DIAGNOSIS — S32810A Multiple fractures of pelvis with stable disruption of pelvic ring, initial encounter for closed fracture: Secondary | ICD-10-CM | POA: Diagnosis present

## 2018-12-10 DIAGNOSIS — I10 Essential (primary) hypertension: Secondary | ICD-10-CM | POA: Diagnosis present

## 2018-12-10 DIAGNOSIS — K219 Gastro-esophageal reflux disease without esophagitis: Secondary | ICD-10-CM | POA: Diagnosis present

## 2018-12-10 DIAGNOSIS — R0602 Shortness of breath: Secondary | ICD-10-CM | POA: Diagnosis not present

## 2018-12-10 DIAGNOSIS — J441 Chronic obstructive pulmonary disease with (acute) exacerbation: Secondary | ICD-10-CM | POA: Diagnosis not present

## 2018-12-10 DIAGNOSIS — J439 Emphysema, unspecified: Secondary | ICD-10-CM | POA: Diagnosis present

## 2018-12-10 DIAGNOSIS — E440 Moderate protein-calorie malnutrition: Secondary | ICD-10-CM | POA: Diagnosis present

## 2018-12-10 DIAGNOSIS — Z66 Do not resuscitate: Secondary | ICD-10-CM | POA: Diagnosis not present

## 2018-12-10 DIAGNOSIS — S329XXA Fracture of unspecified parts of lumbosacral spine and pelvis, initial encounter for closed fracture: Secondary | ICD-10-CM | POA: Diagnosis present

## 2018-12-10 DIAGNOSIS — M810 Age-related osteoporosis without current pathological fracture: Secondary | ICD-10-CM | POA: Diagnosis present

## 2018-12-10 DIAGNOSIS — Z87891 Personal history of nicotine dependence: Secondary | ICD-10-CM

## 2018-12-10 DIAGNOSIS — S62102A Fracture of unspecified carpal bone, left wrist, initial encounter for closed fracture: Secondary | ICD-10-CM | POA: Diagnosis present

## 2018-12-10 DIAGNOSIS — Z882 Allergy status to sulfonamides status: Secondary | ICD-10-CM

## 2018-12-10 DIAGNOSIS — Z807 Family history of other malignant neoplasms of lymphoid, hematopoietic and related tissues: Secondary | ICD-10-CM

## 2018-12-10 DIAGNOSIS — Z9181 History of falling: Secondary | ICD-10-CM

## 2018-12-10 DIAGNOSIS — Z9981 Dependence on supplemental oxygen: Secondary | ICD-10-CM

## 2018-12-10 DIAGNOSIS — J438 Other emphysema: Secondary | ICD-10-CM | POA: Diagnosis not present

## 2018-12-10 DIAGNOSIS — R000 Tachycardia, unspecified: Secondary | ICD-10-CM | POA: Diagnosis not present

## 2018-12-10 DIAGNOSIS — S51811A Laceration without foreign body of right forearm, initial encounter: Secondary | ICD-10-CM | POA: Diagnosis present

## 2018-12-10 LAB — CBC WITH DIFFERENTIAL/PLATELET
Abs Immature Granulocytes: 0.09 10*3/uL — ABNORMAL HIGH (ref 0.00–0.07)
BASOS ABS: 0.1 10*3/uL (ref 0.0–0.1)
Basophils Relative: 0 %
EOS ABS: 0.2 10*3/uL (ref 0.0–0.5)
Eosinophils Relative: 1 %
HCT: 35.7 % — ABNORMAL LOW (ref 36.0–46.0)
Hemoglobin: 10.5 g/dL — ABNORMAL LOW (ref 12.0–15.0)
Immature Granulocytes: 1 %
Lymphocytes Relative: 6 %
Lymphs Abs: 0.9 10*3/uL (ref 0.7–4.0)
MCH: 29 pg (ref 26.0–34.0)
MCHC: 29.4 g/dL — ABNORMAL LOW (ref 30.0–36.0)
MCV: 98.6 fL (ref 80.0–100.0)
Monocytes Absolute: 1.3 10*3/uL — ABNORMAL HIGH (ref 0.1–1.0)
Monocytes Relative: 8 %
Neutro Abs: 12.5 10*3/uL — ABNORMAL HIGH (ref 1.7–7.7)
Neutrophils Relative %: 84 %
Platelets: 451 10*3/uL — ABNORMAL HIGH (ref 150–400)
RBC: 3.62 MIL/uL — ABNORMAL LOW (ref 3.87–5.11)
RDW: 13.7 % (ref 11.5–15.5)
WBC: 15 10*3/uL — ABNORMAL HIGH (ref 4.0–10.5)
nRBC: 0 % (ref 0.0–0.2)

## 2018-12-10 LAB — COMPREHENSIVE METABOLIC PANEL
ALT: 8 U/L (ref 0–44)
AST: 11 U/L — AB (ref 15–41)
Albumin: 2.7 g/dL — ABNORMAL LOW (ref 3.5–5.0)
Alkaline Phosphatase: 96 U/L (ref 38–126)
Anion gap: 9 (ref 5–15)
BUN: 6 mg/dL — ABNORMAL LOW (ref 8–23)
CO2: 39 mmol/L — ABNORMAL HIGH (ref 22–32)
CREATININE: 0.45 mg/dL (ref 0.44–1.00)
Calcium: 9.4 mg/dL (ref 8.9–10.3)
Chloride: 90 mmol/L — ABNORMAL LOW (ref 98–111)
GFR calc Af Amer: >60 mL/min (ref >60)
GFR calc non Af Amer: >60 mL/min (ref >60)
Glucose, Bld: 118 mg/dL — ABNORMAL HIGH (ref 70–99)
Potassium: 3.9 mmol/L (ref 3.5–5.1)
Sodium: 138 mmol/L (ref 135–145)
Total Bilirubin: 0.9 mg/dL (ref 0.3–1.2)
Total Protein: 5.8 g/dL — ABNORMAL LOW (ref 6.5–8.1)

## 2018-12-10 MED ORDER — ALBUTEROL SULFATE (2.5 MG/3ML) 0.083% IN NEBU: 2.5000 mg | INHALATION_SOLUTION | Freq: Four times a day (QID) | RESPIRATORY_TRACT | Status: DC | PRN

## 2018-12-10 MED ORDER — DOCUSATE SODIUM 100 MG PO CAPS
100.0000 mg | ORAL_CAPSULE | Freq: Two times a day (BID) | ORAL | Status: DC
  Administered 2018-12-10 – 2018-12-13 (×7): 100 mg via ORAL
  Filled 2018-12-10 (×7): qty 1

## 2018-12-10 MED ORDER — GUAIFENESIN-DM 100-10 MG/5ML PO SYRP
15.0000 mL | ORAL_SOLUTION | ORAL | Status: DC | PRN
  Administered 2018-12-10: 15 mL via ORAL
  Filled 2018-12-10 (×3): qty 15

## 2018-12-10 MED ORDER — METHYLPREDNISOLONE SODIUM SUCC 125 MG IJ SOLR
125.0000 mg | Freq: Once | INTRAMUSCULAR | Status: AC
  Administered 2018-12-10: 125 mg via INTRAVENOUS
  Filled 2018-12-10: qty 2

## 2018-12-10 MED ORDER — SODIUM CHLORIDE 0.9 % IV SOLN
500.0000 mg | Freq: Once | INTRAVENOUS | Status: AC
  Administered 2018-12-10: 500 mg via INTRAVENOUS
  Filled 2018-12-10: qty 500

## 2018-12-10 MED ORDER — CALCIUM CARBONATE-VITAMIN D 600-400 MG-UNIT PO TABS: 1.0000 | ORAL_TABLET | Freq: Two times a day (BID) | ORAL | Status: DC

## 2018-12-10 MED ORDER — METHYLPREDNISOLONE SODIUM SUCC 40 MG IJ SOLR
40.0000 mg | Freq: Three times a day (TID) | INTRAMUSCULAR | Status: DC
  Administered 2018-12-10 – 2018-12-13 (×9): 40 mg via INTRAVENOUS
  Filled 2018-12-10 (×10): qty 1

## 2018-12-10 MED ORDER — ACETAMINOPHEN 500 MG PO TABS
500.0000 mg | ORAL_TABLET | Freq: Four times a day (QID) | ORAL | Status: DC | PRN
  Administered 2018-12-10 – 2018-12-12 (×2): 500 mg via ORAL
  Filled 2018-12-10 (×2): qty 1

## 2018-12-10 MED ORDER — HEPARIN SODIUM (PORCINE) 5000 UNIT/ML IJ SOLN
5000.0000 [IU] | Freq: Three times a day (TID) | INTRAMUSCULAR | Status: DC
  Administered 2018-12-10 – 2018-12-13 (×10): 5000 [IU] via SUBCUTANEOUS
  Filled 2018-12-10 (×10): qty 1

## 2018-12-10 MED ORDER — IPRATROPIUM-ALBUTEROL 0.5-2.5 (3) MG/3ML IN SOLN
3.0000 mL | Freq: Four times a day (QID) | RESPIRATORY_TRACT | Status: DC
  Administered 2018-12-10: 3 mL via RESPIRATORY_TRACT
  Filled 2018-12-10: qty 3

## 2018-12-10 MED ORDER — ALBUTEROL (5 MG/ML) CONTINUOUS INHALATION SOLN
10.0000 mg/h | INHALATION_SOLUTION | RESPIRATORY_TRACT | Status: DC
  Administered 2018-12-10: 10 mg/h via RESPIRATORY_TRACT
  Filled 2018-12-10: qty 20

## 2018-12-10 MED ORDER — IPRATROPIUM BROMIDE 0.02 % IN SOLN
1.0000 mg | Freq: Once | RESPIRATORY_TRACT | Status: AC
  Administered 2018-12-10: 1 mg via RESPIRATORY_TRACT
  Filled 2018-12-10: qty 5

## 2018-12-10 MED ORDER — LACTATED RINGERS IV BOLUS
1000.0000 mL | Freq: Once | INTRAVENOUS | Status: AC
  Administered 2018-12-10: 1000 mL via INTRAVENOUS

## 2018-12-10 MED ORDER — VITAMIN D 25 MCG (1000 UNIT) PO TABS
2000.0000 [IU] | ORAL_TABLET | Freq: Two times a day (BID) | ORAL | Status: DC
  Administered 2018-12-10 – 2018-12-13 (×7): 2000 [IU] via ORAL
  Filled 2018-12-10 (×7): qty 2

## 2018-12-10 MED ORDER — TIOTROPIUM BROMIDE MONOHYDRATE 18 MCG IN CAPS: 18.0000 ug | ORAL_CAPSULE | Freq: Every day | RESPIRATORY_TRACT | Status: DC

## 2018-12-10 MED ORDER — CALCIUM CARBONATE-VITAMIN D 500-200 MG-UNIT PO TABS
1.0000 | ORAL_TABLET | Freq: Two times a day (BID) | ORAL | Status: DC
  Administered 2018-12-10 – 2018-12-13 (×7): 1 via ORAL
  Filled 2018-12-10 (×7): qty 1

## 2018-12-10 MED ORDER — DM-BENZOCAINE-MENTHOL 5-6-10 MG MT LOZG: 1.0000 | LOZENGE | Freq: Every day | OROMUCOSAL | Status: DC | PRN

## 2018-12-10 MED ORDER — PANTOPRAZOLE SODIUM 40 MG PO TBEC
40.0000 mg | DELAYED_RELEASE_TABLET | Freq: Every day | ORAL | Status: DC
  Administered 2018-12-10 – 2018-12-13 (×4): 40 mg via ORAL
  Filled 2018-12-10 (×4): qty 1

## 2018-12-10 MED ORDER — UMECLIDINIUM BROMIDE 62.5 MCG/INH IN AEPB
1.0000 | INHALATION_SPRAY | Freq: Every day | RESPIRATORY_TRACT | Status: DC
  Administered 2018-12-11 – 2018-12-13 (×2): 1 via RESPIRATORY_TRACT
  Filled 2018-12-10 (×2): qty 7

## 2018-12-10 MED ORDER — FLUTICASONE FUROATE-VILANTEROL 200-25 MCG/INH IN AEPB
1.0000 | INHALATION_SPRAY | Freq: Every day | RESPIRATORY_TRACT | Status: DC
  Administered 2018-12-11 – 2018-12-13 (×2): 1 via RESPIRATORY_TRACT
  Filled 2018-12-10 (×2): qty 28

## 2018-12-10 MED ORDER — VITAMIN C 500 MG PO TABS
500.0000 mg | ORAL_TABLET | Freq: Every day | ORAL | Status: DC
  Administered 2018-12-11 – 2018-12-13 (×3): 500 mg via ORAL
  Filled 2018-12-10 (×4): qty 1

## 2018-12-10 MED ORDER — IPRATROPIUM-ALBUTEROL 0.5-2.5 (3) MG/3ML IN SOLN
3.0000 mL | Freq: Three times a day (TID) | RESPIRATORY_TRACT | Status: DC
  Administered 2018-12-11 – 2018-12-12 (×4): 3 mL via RESPIRATORY_TRACT
  Filled 2018-12-10 (×4): qty 3

## 2018-12-10 NOTE — Progress Notes (Signed)
RRT  Went to pt room to turn off CAT. Upon arrival pt daughter states that she took about 3/4 of the treatment and they took it off. Daughter states pt began to shake and didn't like it so they removed the mask. Pt with improved breath sounds throughout. Rhonchi bilateral with no wheezing. Pt vitals remain stable. No further needs at this time.

## 2018-12-10 NOTE — ED Notes (Addendum)
Consulted IV team due to pt being a diffcult IV start. Pt has broken left wrist with swelling into her whole left arm. Pt has been stuck twice on her right arm by michelle RN with no success.

## 2018-12-10 NOTE — ED Triage Notes (Signed)
Pt BIB [facility] from [facility] with shortness of breath and chest pain that began at approx 1230 today. Pt given 5mg  albuterol by EMS. EMS states improvement with breathing treatment.

## 2018-12-10 NOTE — ED Notes (Signed)
Admitting MD at bedside.

## 2018-12-10 NOTE — H&P (Addendum)
TRH H&P   Patient Demographics:    Sheila Mcconnell, is a 85 y.o. female  MRN: [id number]   DOB - 09-12-1927  Admit Date - 12/10/2018  Outpatient Primary MD for the patient is Ma Hillock, DO  Referring MD/NP/PA: Dr Dayna Barker  Patient coming from: SNF  Chief Complaint  Patient presents with  . Shortness of Breath      HPI:    Sheila Mcconnell  is a 85 y.o. female, with past medical history of hypertension, COPD, anemia, chronic respiratory failure at 2 L baseline, patient with recent hospitalization for fall, with pelvic fracture, discharged to subacute rehab, patient presents from facility with complaints of Hartness of breath, reportedly developed today, she has some dyspnea at baseline, felt slightly worsened today, mainly on exertion, she reports cough, nonproductive, denies fever, chills, chest pain, hemoptysis, leg swelling, diaphoresis or syncope. -While in ED, she had some wheezing, diminished air entry, she received some IV Solu-Medrol, continuous DuoNeb, she then had sinus tachycardia, chest x-ray with no acute findings, she was afebrile, was called to admit for COPD exacerbation    Review of systems:    In addition to the HPI above,  No Fever-chills, No Headache, No changes with Vision or hearing, No problems swallowing food or Liquids, No Chest pain, does report cough, nonproductive, she does report dyspnea No Abdominal pain, No Nausea or Vommitting, Bowel movements are regular, No Blood in stool or Urine, No dysuria, No new skin rashes or bruises, No new joints pains-aches,  No new weakness, tingling, numbness in any extremity, No recent weight gain or loss, No polyuria, polydypsia or polyphagia, No significant Mental Stressors.  A full 10 point Review of Systems was done, except as stated above, all other Review of Systems were negative.   With Past History of  the following :    Past Medical History:  Diagnosis Date  . Allergy   . Cataracts, bilateral   . Dependence on supplemental oxygen    3.5l (pts stated 2.5 LNC), [facility]  . Emphysema of lung ([place])   . GERD (gastroesophageal reflux disease)   . Hyperlipidemia   . Hypertension   . Osteoporosis    had been on Boniva      Past Surgical History:  Procedure Laterality Date  . NO PAST SURGERIES        Social History:     Social History   Tobacco Use  . Smoking status: Former Smoker    Packs/day: 1.00    Years: 50.00    Pack years: 50.00    Types: Cigarettes  . Smokeless tobacco: Never Used  Substance Use Topics  . Alcohol use: Yes    Alcohol/week: 2.0 standard drinks    Types: 2 Glasses of wine per week     Lives - at subacute rehab  Mobility -with PT     Family History :  Family History  Problem Relation Age of Onset  . Diabetes Mother   . Hearing loss Mother   . Lymphoma Mother       Home Medications:   Prior to Admission medications   Medication Sig Start Date End Date Taking? Authorizing Provider  acetaminophen (TYLENOL) 500 MG tablet Take 500 mg by mouth every 6 (six) hours as needed for moderate pain.   Yes [provider]  albuterol (PROVENTIL) (2.5 MG/3ML) 0.083% nebulizer solution Take 2.5 mg by nebulization every 6 (six) hours as needed for wheezing.   Yes [provider]  Calcium Carbonate-Vitamin D (CALCIUM 600+D) 600-400 MG-UNIT tablet Take 1 tablet by mouth 2 (two) times daily.   Yes [provider]  cholecalciferol (VITAMIN D) 1000 units tablet Take 2 tablets (2,000 Units total) by mouth 2 (two) times daily. 11/30/18  Yes Georgette Shell, MD  Dextromethorphan-guaiFENesin (GERI-TUSSIN DM) 10-100 MG/5ML liquid Take 15 mLs by mouth every 4 (four) hours as needed (cough).   Yes [provider]  DM-Benzocaine-Menthol (CHLORASEPTIC TOTAL) 04-09-09 MG LOZG Use as directed 1 tablet in the mouth or  throat daily as needed (sore throat).   Yes [provider]  docusate sodium (COLACE) 100 MG capsule Take 1 capsule (100 mg total) by mouth 2 (two) times daily. 10/24/18  Yes Hosie Poisson, MD  Fluticasone-Salmeterol (ADVAIR) 250-50 MCG/DOSE AEPB Inhale 1 puff into the lungs 2 (two) times daily.   Yes [provider]  ipratropium (ATROVENT HFA) 17 MCG/ACT inhaler Inhale 2 puffs into the lungs every 6 (six) hours.   Yes [provider]  ipratropium-albuterol (DUONEB) 0.5-2.5 (3) MG/3ML SOLN Take 3 mLs by nebulization 3 (three) times daily. 07/06/18  Yes Shelly Coss, MD  mirtazapine (REMERON) 7.5 MG tablet Take 7.5 mg by mouth at bedtime.    Yes [provider]  tiotropium (SPIRIVA) 18 MCG inhalation capsule Place 1 capsule (18 mcg total) into inhaler and inhale daily. 07/06/18  Yes Shelly Coss, MD  vitamin C (VITAMIN C) 500 MG tablet Take 1 tablet (500 mg total) by mouth daily. 12/01/18  Yes Georgette Shell, MD  calcium citrate (CALCITRATE - DOSED IN MG ELEMENTAL CALCIUM) 950 MG tablet Take 1 tablet (200 mg of elemental calcium total) by mouth 2 (two) times daily. Patient not taking: Reported on 12/10/2018 11/30/18   Georgette Shell, MD     Allergies:     Allergies  Allergen Reactions  . Sulfa Antibiotics Nausea Only     Physical Exam:   Vitals  Blood pressure 127/61, pulse (!) 114, temperature 98.6 F (37 C), temperature source Oral, resp. rate 20, SpO2 100 %.   1. General pale, frail,  thin appearing female, laying in bed in no apparent distress  2. Normal affect and insight, Not Suicidal or Homicidal, Awake Alert, Oriented X 3.  3. No F.N deficits, ALL C.Nerves Intact, Strength 5/5 all 4 extremities, Sensation intact all 4 extremities, Plantars down going.  4. Ears and Eyes appear Normal, Conjunctivae clear, PERRLA. Moist Oral Mucosa.  5. Supple Neck, No JVD, No cervical lymphadenopathy appriciated, No Carotid Bruits.  6.  Symmetrical Chest wall movement, diminished entry bilaterally, with scattered wheezing  7.  Tachycardic, No Gallops, Rubs or Murmurs, No Parasternal Heave.  8. Positive Bowel Sounds, Abdomen Soft, No tenderness, No organomegaly appriciated,No rebound -guarding or rigidity.  9.  No Cyanosis, Normal Skin Turgor, skin tear in the right lower extremity from her recent fall,  10. Good muscle tone,  joints appear normal , no effusions, Normal ROM.  Left forearm in a cast  11. No Palpable Lymph Nodes in Neck or Axillae     Data Review:    CBC Recent Labs  Lab 12/10/18 1400  WBC 15.0*  HGB 10.5*  HCT 35.7*  PLT 451*  MCV 98.6  MCH 29.0  MCHC 29.4*  RDW 13.7  LYMPHSABS 0.9  MONOABS 1.3*  EOSABS 0.2  BASOSABS 0.1   ------------------------------------------------------------------------------------------------------------------  Chemistries  Recent Labs  Lab 12/10/18 1400  NA 138  K 3.9  CL 90*  CO2 39*  GLUCOSE 118*  BUN 6*  CREATININE 0.45  CALCIUM 9.4  AST 11*  ALT 8  ALKPHOS 96  BILITOT 0.9   ------------------------------------------------------------------------------------------------------------------ estimated creatinine clearance is 32.4 mL/min (by C-G formula based on SCr of 0.45 mg/dL). ------------------------------------------------------------------------------------------------------------------ No results for input(s): TSH, T4TOTAL, T3FREE, THYROIDAB in the last 72 hours.  Invalid input(s): FREET3  Coagulation profile No results for input(s): INR, PROTIME in the last 168 hours. ------------------------------------------------------------------------------------------------------------------- No results for input(s): DDIMER in the last 72 hours. -------------------------------------------------------------------------------------------------------------------  Cardiac Enzymes No results for input(s): CKMB, TROPONINI, MYOGLOBIN in the last 168  hours.  Invalid input(s): CK ------------------------------------------------------------------------------------------------------------------    Component Value Date/Time   BNP 23.6 07/04/2018 0014     ---------------------------------------------------------------------------------------------------------------  Urinalysis    Component Value Date/Time   COLORURINE AMBER (A) 11/29/2018 0922   APPEARANCEUR CLOUDY (A) 11/29/2018 0922   LABSPEC 1.034 (H) 11/29/2018 0922   PHURINE 5.0 11/29/2018 0922   GLUCOSEU NEGATIVE 11/29/2018 0922   HGBUR NEGATIVE 11/29/2018 0922   BILIRUBINUR SMALL (A) 11/29/2018 0922   KETONESUR 20 (A) 11/29/2018 0922   PROTEINUR 30 (A) 11/29/2018 0922   NITRITE NEGATIVE 11/29/2018 0922   LEUKOCYTESUR TRACE (A) 11/29/2018 0922    ----------------------------------------------------------------------------------------------------------------   Imaging Results:    Dg Chest Portable 1 View  Result Date: 12/10/2018 CLINICAL DATA:  Shortness of breath, chest pain EXAM: PORTABLE CHEST 1 VIEW COMPARISON:  11/27/2018 FINDINGS: The lungs are hyperinflated likely secondary to COPD. There is no focal parenchymal opacity. There is no pleural effusion or pneumothorax. The heart and mediastinal contours are unremarkable. The osseous structures are unremarkable. IMPRESSION: No active disease. Electronically Signed   By: Kathreen Devoid   On: 12/10/2018 13:39    My personal review of EKG: Rhythm NSR, Rate  121 /min, QTc 452 , no Acute ST changes   Assessment & Plan:    Active Problems:   Emphysema lung (HCC)   Oxygen dependent   Hypertension   Chronic hypercapnic respiratory failure (HCC)   Malnutrition of moderate degree   Left wrist fracture   Pelvic fracture (HCC)   COPD with acute exacerbation (HCC)   COPD exacerbation (HCC)    COPD exacerbation -Patient with diminished air entry, wheezing, will continue with Solu-Medrol 40 mg IV every 12 hours, scheduled  duo nebs, and PRN albuterol, continue with home meds Symbicort and Spiriva . -Cough is nonproductive, no indication for antibiotics . -Patient with sinus tachycardia, most likely in the setting of her recent long albuterol treatment, she denies any chest pain, leg pain, or leg swelling, as well she is not hypoxic, low clinical suspicion for PE  Chronic respiratory failure  -at Baseline, on 2 L nasal cannula   History of falls, with recent pelvic fracture,/left wrist fracture -He is supposed to follow with Dr. Marcelino Scot tomorrow, I have called his office notified him patient is admitted -Consulted PT/OT  Hypertension -Blood pressure appears to  be controlled, she is not on any home meds  protein calorie malnutrition -Started on Ensure    DVT Prophylaxis Heparin - SCDs   AM Labs Ordered, also please review Full Orders  Family Communication: Admission, patients condition and plan of care including tests being ordered have been discussed with the patient and Daughter who indicate understanding and agree with the plan and Code Status.  Code Status DNR  Likely DC to back tom SNF  Condition GUARDED   Consults called: None   Admission status: Observation  Time spent in minutes : 50 MINUTES   Phillips Climes M.D on 12/10/2018 at 4:47 PM  Between 7am to 7pm - Pager - [phone]. After 7pm go to [email or web address] - password [facility]  [facility] - Office  [phone]

## 2018-12-10 NOTE — ED Notes (Signed)
Family at bedside. 

## 2018-12-11 ENCOUNTER — Other Ambulatory Visit: Payer: Self-pay

## 2018-12-11 ENCOUNTER — Observation Stay (HOSPITAL_COMMUNITY): Payer: Medicare Other

## 2018-12-11 DIAGNOSIS — R7989 Other specified abnormal findings of blood chemistry: Secondary | ICD-10-CM | POA: Diagnosis not present

## 2018-12-11 DIAGNOSIS — I700 Atherosclerosis of aorta: Secondary | ICD-10-CM | POA: Diagnosis not present

## 2018-12-11 DIAGNOSIS — Z87891 Personal history of nicotine dependence: Secondary | ICD-10-CM | POA: Diagnosis not present

## 2018-12-11 DIAGNOSIS — Z9981 Dependence on supplemental oxygen: Secondary | ICD-10-CM

## 2018-12-11 DIAGNOSIS — S51811A Laceration without foreign body of right forearm, initial encounter: Secondary | ICD-10-CM | POA: Diagnosis present

## 2018-12-11 DIAGNOSIS — Z807 Family history of other malignant neoplasms of lymphoid, hematopoietic and related tissues: Secondary | ICD-10-CM | POA: Diagnosis not present

## 2018-12-11 DIAGNOSIS — Z743 Need for continuous supervision: Secondary | ICD-10-CM | POA: Diagnosis not present

## 2018-12-11 DIAGNOSIS — S32810A Multiple fractures of pelvis with stable disruption of pelvic ring, initial encounter for closed fracture: Secondary | ICD-10-CM | POA: Diagnosis present

## 2018-12-11 DIAGNOSIS — J9612 Chronic respiratory failure with hypercapnia: Secondary | ICD-10-CM | POA: Diagnosis present

## 2018-12-11 DIAGNOSIS — R41841 Cognitive communication deficit: Secondary | ICD-10-CM | POA: Diagnosis not present

## 2018-12-11 DIAGNOSIS — Z833 Family history of diabetes mellitus: Secondary | ICD-10-CM | POA: Diagnosis not present

## 2018-12-11 DIAGNOSIS — S62102D Fracture of unspecified carpal bone, left wrist, subsequent encounter for fracture with routine healing: Secondary | ICD-10-CM | POA: Diagnosis not present

## 2018-12-11 DIAGNOSIS — M810 Age-related osteoporosis without current pathological fracture: Secondary | ICD-10-CM | POA: Diagnosis present

## 2018-12-11 DIAGNOSIS — S329XXS Fracture of unspecified parts of lumbosacral spine and pelvis, sequela: Secondary | ICD-10-CM | POA: Diagnosis not present

## 2018-12-11 DIAGNOSIS — R279 Unspecified lack of coordination: Secondary | ICD-10-CM | POA: Diagnosis not present

## 2018-12-11 DIAGNOSIS — R0602 Shortness of breath: Secondary | ICD-10-CM | POA: Diagnosis not present

## 2018-12-11 DIAGNOSIS — M6281 Muscle weakness (generalized): Secondary | ICD-10-CM | POA: Diagnosis not present

## 2018-12-11 DIAGNOSIS — K219 Gastro-esophageal reflux disease without esophagitis: Secondary | ICD-10-CM | POA: Diagnosis present

## 2018-12-11 DIAGNOSIS — I10 Essential (primary) hypertension: Secondary | ICD-10-CM | POA: Diagnosis present

## 2018-12-11 DIAGNOSIS — J441 Chronic obstructive pulmonary disease with (acute) exacerbation: Secondary | ICD-10-CM | POA: Diagnosis present

## 2018-12-11 DIAGNOSIS — R278 Other lack of coordination: Secondary | ICD-10-CM | POA: Diagnosis not present

## 2018-12-11 DIAGNOSIS — S52592D Other fractures of lower end of left radius, subsequent encounter for closed fracture with routine healing: Secondary | ICD-10-CM | POA: Diagnosis not present

## 2018-12-11 DIAGNOSIS — Z9181 History of falling: Secondary | ICD-10-CM | POA: Diagnosis not present

## 2018-12-11 DIAGNOSIS — Z882 Allergy status to sulfonamides status: Secondary | ICD-10-CM | POA: Diagnosis not present

## 2018-12-11 DIAGNOSIS — E440 Moderate protein-calorie malnutrition: Secondary | ICD-10-CM | POA: Diagnosis present

## 2018-12-11 DIAGNOSIS — Z66 Do not resuscitate: Secondary | ICD-10-CM | POA: Diagnosis present

## 2018-12-11 DIAGNOSIS — S32312D Displaced avulsion fracture of left ilium, subsequent encounter for fracture with routine healing: Secondary | ICD-10-CM | POA: Diagnosis not present

## 2018-12-11 DIAGNOSIS — R2689 Other abnormalities of gait and mobility: Secondary | ICD-10-CM | POA: Diagnosis not present

## 2018-12-11 DIAGNOSIS — S32309A Unspecified fracture of unspecified ilium, initial encounter for closed fracture: Secondary | ICD-10-CM | POA: Diagnosis not present

## 2018-12-11 DIAGNOSIS — R2681 Unsteadiness on feet: Secondary | ICD-10-CM | POA: Diagnosis not present

## 2018-12-11 DIAGNOSIS — W19XXXA Unspecified fall, initial encounter: Secondary | ICD-10-CM | POA: Diagnosis not present

## 2018-12-11 LAB — CBC
HEMATOCRIT: 33.2 % — AB (ref 36.0–46.0)
Hemoglobin: 9.9 g/dL — ABNORMAL LOW (ref 12.0–15.0)
MCH: 28.9 pg (ref 26.0–34.0)
MCHC: 29.8 g/dL — ABNORMAL LOW (ref 30.0–36.0)
MCV: 96.8 fL (ref 80.0–100.0)
Platelets: 439 10*3/uL — ABNORMAL HIGH (ref 150–400)
RBC: 3.43 MIL/uL — ABNORMAL LOW (ref 3.87–5.11)
RDW: 13.8 % (ref 11.5–15.5)
WBC: 10.4 10*3/uL (ref 4.0–10.5)
nRBC: 0 % (ref 0.0–0.2)

## 2018-12-11 LAB — BASIC METABOLIC PANEL
Anion gap: 8 (ref 5–15)
BUN: 8 mg/dL (ref 8–23)
CO2: 36 mmol/L — ABNORMAL HIGH (ref 22–32)
CREATININE: 0.39 mg/dL — AB (ref 0.44–1.00)
Calcium: 9.2 mg/dL (ref 8.9–10.3)
Chloride: 94 mmol/L — ABNORMAL LOW (ref 98–111)
GFR calc Af Amer: >60 mL/min (ref >60)
GFR calc non Af Amer: >60 mL/min (ref >60)
Glucose, Bld: 121 mg/dL — ABNORMAL HIGH (ref 70–99)
Potassium: 4.6 mmol/L (ref 3.5–5.1)
Sodium: 138 mmol/L (ref 135–145)

## 2018-12-11 LAB — D-DIMER, QUANTITATIVE: D-Dimer, Quant: 1.54 ug/mL-FEU — ABNORMAL HIGH (ref 0.00–0.50)

## 2018-12-11 MED ORDER — MIRTAZAPINE 15 MG PO TABS
7.5000 mg | ORAL_TABLET | Freq: Every day | ORAL | Status: DC
  Administered 2018-12-11 – 2018-12-13 (×3): 7.5 mg via ORAL
  Filled 2018-12-11 (×3): qty 1

## 2018-12-11 NOTE — Evaluation (Signed)
Occupational Therapy Evaluation Patient Details Name: Sheila Mcconnell MRN: [id number] DOB: 01-23-1927 Today's Date: 12/11/2018    History of Present Illness Pt is 85 y/o female presenting from SNF with COPD exacerbation. She was at SNF 2 recent falls with acute ventral and medially displaced fx of L iliac bone, left wrist fracture and left superior and inferior pubic rami fracture. PMH significant for medical history significant of severe COPD, chronic hypoxic respiratory failure, GERD, hypertension, dyslipidemia, dependence on oxygen, HOH, and osteoporosis.   Clinical Impression   Pt with decline in function and safety with ADLs and ADL mobility with decreased strength, balance, endurance and cognition. Pt fearful of falling and PTA pt at SNF participating in rehab. Pt reported hat she was not walking at SNF but could not explain why she wasn't walking or how much assist she needed with slefcare/ADLs. Pt resided at an ALF prior to falling in December 2019. Pt would benefit from acute OT services to address impairments to maximize level of function and safety    Follow Up Recommendations  SNF;Supervision/Assistance - 24 hour    Equipment Recommendations  Other (comment)(TBD at next venue of care)    Recommendations for Other Services       Precautions / Restrictions Precautions Precautions: Fall Precaution Comments: Pt with minimal verbalizations throughout session. Gesturing at times instead of speaking. Restrictions Weight Bearing Restrictions: Yes LUE Weight Bearing: Non weight bearing Other Position/Activity Restrictions: may weight bear through elbow only      Mobility Bed Mobility Overal bed mobility: Needs Assistance Bed Mobility: Supine to Sit     Supine to sit: Mod assist;HOB elevated;+2 for safety/equipment Sit to supine: Total assist;+2 for physical assistance   General bed mobility comments: +2 utilized for safety but feel she could have completed with +1 assist.  VC's for use of RUE on railing for support.   Transfers Overall transfer level: Needs assistance Equipment used: 2 person hand held assist Transfers: Sit to/from [facility] Sit to Stand: Min assist;+2 physical assistance Stand pivot transfers: Min assist;+2 physical assistance       General transfer comment: Initially stood EOB and pt immediately asked to sit back down - states she has "a fear of heights". On second attempt, pt was able to tolerate a SPT to chair with +2 assist but appeared very guarded and fearful of falling.     Balance Overall balance assessment: Needs assistance Sitting-balance support: Feet supported;No upper extremity supported Sitting balance-Leahy Scale: Fair     Standing balance support: Bilateral upper extremity supported;During functional activity Standing balance-Leahy Scale: Poor Standing balance comment: Reliant on UE support during standing                           ADL either performed or assessed with clinical judgement   ADL Overall ADL's : Needs assistance/impaired Eating/Feeding: Independent;Sitting   Grooming: Wash/dry hands;Wash/dry face;Sitting;Minimal assistance   Upper Body Bathing: Minimal assistance;Sitting   Lower Body Bathing: Maximal assistance   Upper Body Dressing : Minimal assistance;Sitting   Lower Body Dressing: Maximal assistance   Toilet Transfer: Minimal assistance;+2 for physical assistance;Stand-pivot   Toileting- Clothing Manipulation and Hygiene: Maximal assistance;Sit to/from stand       Functional mobility during ADLs: Minimal assistance;+2 for physical assistance General ADL Comments: pt with L forearm casted/splinted due to wrist fx     Vision Patient Visual Report: No change from baseline       Perception  Praxis      Pertinent Vitals/Pain Pain Assessment: No/denies pain Faces Pain Scale: No hurt     Hand Dominance Right   Extremity/Trunk Assessment Upper  Extremity Assessment Upper Extremity Assessment: Generalized weakness;LUE deficits/detail LUE Deficits / Details: splint in place 2* recent wrist fx   Lower Extremity Assessment Lower Extremity Assessment: Defer to PT evaluation   Cervical / Trunk Assessment Cervical / Trunk Assessment: Kyphotic   Communication Communication Communication: HOH   Cognition Arousal/Alertness: Awake/alert Behavior During Therapy: Flat affect Overall Cognitive Status: No family/caregiver present to determine baseline cognitive functioning                                     General Comments       Exercises     Shoulder Instructions      Home Living Family/patient expects to be discharged to:: Skilled nursing facility                                        Prior Functioning/Environment Level of Independence: Needs assistance  Gait / Transfers Assistance Needed: independent without use of AD ADL's / Homemaking Assistance Needed: assistance with meals and ADLs   Comments: Pt reports she was not walking with therapy PTA. Unclear if this is accurate, and if so, why the pt wasn't walking.         OT Problem List: Decreased strength;Decreased activity tolerance;Decreased cognition;Decreased knowledge of use of DME or AE;Impaired UE functional use;Impaired balance (sitting and/or standing);Decreased range of motion;Decreased coordination      OT Treatment/Interventions:      OT Goals(Current goals can be found in the care plan section) Acute Rehab OT Goals Patient Stated Goal: Hurt less OT Goal Formulation: With patient Time For Goal Achievement: 12/25/18 Potential to Achieve Goals: Good ADL Goals Pt Will Perform Grooming: with min guard assist;with supervision;sitting Pt Will Perform Upper Body Bathing: with min guard assist;sitting Pt Will Perform Lower Body Bathing: with mod assist;sitting/lateral leans Pt Will Perform Upper Body Dressing: with min guard  assist;sitting Pt Will Transfer to Toilet: with min assist;with min guard assist;ambulating;regular height toilet;bedside commode;grab bars;stand pivot transfer  OT Frequency: Min 2X/week   Barriers to D/C: Decreased caregiver support  pt will return to SNF for rehab       Co-evaluation PT/OT/SLP Co-Evaluation/Treatment: Yes Reason for Co-Treatment: To address functional/ADL transfers;For patient/therapist safety PT goals addressed during session: Mobility/safety with mobility;Balance OT goals addressed during session: ADL's and self-care;Proper use of Adaptive equipment and DME      AM-PAC OT "6 Clicks" Daily Activity     Outcome Measure Help from another person eating meals?: None(set up) Help from another person taking care of personal grooming?: A Little Help from another person toileting, which includes using toliet, bedpan, or urinal?: A Lot Help from another person bathing (including washing, rinsing, drying)?: A Lot Help from another person to put on and taking off regular upper body clothing?: A Little Help from another person to put on and taking off regular lower body clothing?: A Lot 6 Click Score: 16   End of Session Equipment Utilized During Treatment: Gait belt  Activity Tolerance: Patient tolerated treatment well Patient left: in chair;with call bell/phone within reach;with chair alarm set  OT Visit Diagnosis: Unsteadiness on feet (R26.81);Other abnormalities of gait and mobility (R26.89);Muscle  weakness (generalized) (M62.81);History of falling (Z91.81)                Time: 3462-1947 OT Time Calculation (min): 23 min Charges:  OT General Charges $OT Visit: 1 Visit OT Evaluation $OT Eval Moderate Complexity: 1 Mod    Britt Bottom 12/11/2018, 1:39 PM

## 2018-12-11 NOTE — Progress Notes (Signed)
Assessment done and charted.  Pt states she is not in any pain.  Cast on left arm intact and circulation to the fingers are brisk.  Call bell at side, HOB elevated, bed in low position

## 2018-12-11 NOTE — ED Provider Notes (Signed)
[facility] Emergency Department Provider Note   CSN: [id number] Arrival date & time: 12/10/18  1253     History   Chief Complaint Chief Complaint  Patient presents with  . Shortness of Breath    HPI Sheila Mcconnell is a 85 y.o. female.   Shortness of Breath  Severity:  Mild Timing:  Constant Chronicity:  New Relieved by:  Nothing Worsened by:  Nothing Ineffective treatments:  None tried Associated symptoms: cough and wheezing     Past Medical History:  Diagnosis Date  . Allergy   . Cataracts, bilateral   . Dependence on supplemental oxygen    3.5l (pts stated 2.5 LNC), [employer] services  . Emphysema of lung ([place])   . GERD (gastroesophageal reflux disease)   . Hyperlipidemia   . Hypertension   . Osteoporosis    had been on Boniva    Patient Active Problem List   Diagnosis Date Noted  . COPD with acute exacerbation ([place]) 12/10/2018  . COPD exacerbation ([place]) 12/10/2018  . Pelvis fracture ([place]) 11/27/2018  . Pelvic fracture ([place]) 11/27/2018  . Left wrist fracture 10/21/2018  . Wrist fracture 10/21/2018  . Malnutrition of moderate degree 07/05/2018  . Goals of care, counseling/discussion   . Palliative care encounter   . Emphysema of lung ([place]) 07/04/2018  . Dyspnea on exertion 07/04/2018  . Chronic hypercapnic respiratory failure ([place]) 07/04/2018  . Emphysema lung ([place]) 06/27/2017  . Oxygen dependent 06/27/2017  . Hypertension 06/27/2017    Past Surgical History:  Procedure Laterality Date  . NO PAST SURGERIES       OB History    Gravida  4   Para  4   Term      Preterm      AB      Living  4     SAB      TAB      Ectopic      Multiple      Live Births               Home Medications    Prior to Admission medications   Medication Sig Start Date End Date Taking? Authorizing Provider  acetaminophen (TYLENOL) 500 MG tablet Take 500 mg by mouth every 6 (six) hours as needed for moderate pain.   Yes [provider]  albuterol  (PROVENTIL) (2.5 MG/3ML) 0.083% nebulizer solution Take 2.5 mg by nebulization every 6 (six) hours as needed for wheezing.   Yes [provider]  Calcium Carbonate-Vitamin D (CALCIUM 600+D) 600-400 MG-UNIT tablet Take 1 tablet by mouth 2 (two) times daily.   Yes [provider]  cholecalciferol (VITAMIN D) 1000 units tablet Take 2 tablets (2,000 Units total) by mouth 2 (two) times daily. 11/30/18  Yes Georgette Shell, MD  Dextromethorphan-guaiFENesin (GERI-TUSSIN DM) 10-100 MG/5ML liquid Take 15 mLs by mouth every 4 (four) hours as needed (cough).   Yes [provider]  DM-Benzocaine-Menthol (CHLORASEPTIC TOTAL) 04-09-09 MG LOZG Use as directed 1 tablet in the mouth or throat daily as needed (sore throat).   Yes [provider]  docusate sodium (COLACE) 100 MG capsule Take 1 capsule (100 mg total) by mouth 2 (two) times daily. 10/24/18  Yes Hosie Poisson, MD  Fluticasone-Salmeterol (ADVAIR) 250-50 MCG/DOSE AEPB Inhale 1 puff into the lungs 2 (two) times daily.   Yes [provider]  ipratropium (ATROVENT HFA) 17 MCG/ACT inhaler Inhale 2 puffs into the lungs every 6 (six) hours.   Yes [provider]  ipratropium-albuterol (DUONEB) 0.5-2.5 (3) MG/3ML SOLN Take 3 mLs by nebulization 3 (three) times daily. 07/06/18  Yes Shelly Coss, MD  mirtazapine (REMERON) 7.5 MG tablet Take 7.5 mg by mouth at bedtime.    Yes [provider]  tiotropium (SPIRIVA) 18 MCG inhalation capsule Place 1 capsule (18 mcg total) into inhaler and inhale daily. 07/06/18  Yes Shelly Coss, MD  vitamin C (VITAMIN C) 500 MG tablet Take 1 tablet (500 mg total) by mouth daily. 12/01/18  Yes Georgette Shell, MD  calcium citrate (CALCITRATE - DOSED IN MG ELEMENTAL CALCIUM) 950 MG tablet Take 1 tablet (200 mg of elemental calcium total) by mouth 2 (two) times daily. Patient not taking: Reported on 12/10/2018 11/30/18   Georgette Shell, MD    Family  History Family History  Problem Relation Age of Onset  . Diabetes Mother   . Hearing loss Mother   . Lymphoma Mother     Social History Social History   Tobacco Use  . Smoking status: Former Smoker    Packs/day: 1.00    Years: 50.00    Pack years: 50.00    Types: Cigarettes  . Smokeless tobacco: Never Used  Substance Use Topics  . Alcohol use: Yes    Alcohol/week: 2.0 standard drinks    Types: 2 Glasses of wine per week  . Drug use: No     Allergies   Sulfa antibiotics   Review of Systems Review of Systems  Respiratory: Positive for cough, shortness of breath and wheezing.   All other systems reviewed and are negative.    Physical Exam Updated Vital Signs BP 124/69 (BP Location: Left Arm)   Pulse 87   Temp 97.8 F (36.6 C) (Oral)   Resp 20   SpO2 93%   Physical Exam Vitals signs and nursing note reviewed.  Constitutional:      Appearance: She is well-developed.  HENT:     Head: Normocephalic and atraumatic.     Mouth/Throat:     Mouth: Mucous membranes are dry.  Eyes:     Extraocular Movements: Extraocular movements intact.     Conjunctiva/sclera: Conjunctivae normal.     Pupils: Pupils are equal, round, and reactive to light.  Neck:     Musculoskeletal: Normal range of motion.  Cardiovascular:     Rate and Rhythm: Normal rate and regular rhythm.  Pulmonary:     Effort: No respiratory distress.     Breath sounds: No stridor. Decreased breath sounds and wheezing present.  Abdominal:     General: Abdomen is flat. There is no distension.  Musculoskeletal: Normal range of motion.        General: No swelling.  Skin:    General: Skin is warm and dry.  Neurological:     General: No focal deficit present.     Mental Status: She is alert.      ED Treatments / Results  Labs (all labs ordered are listed, but only abnormal results are displayed) Labs Reviewed  CBC WITH DIFFERENTIAL/PLATELET - Abnormal; Notable for the following components:       Result Value   WBC 15.0 (*)    RBC 3.62 (*)    Hemoglobin 10.5 (*)    HCT 35.7 (*)    MCHC 29.4 (*)    Platelets 451 (*)    Neutro Abs 12.5 (*)    Monocytes Absolute 1.3 (*)    Abs Immature Granulocytes 0.09 (*)    All other components within normal limits  COMPREHENSIVE METABOLIC PANEL - Abnormal; Notable for the following components:   Chloride 90 (*)    CO2 39 (*)    Glucose, Bld 118 (*)    BUN 6 (*)    Total Protein 5.8 (*)    Albumin 2.7 (*)    AST 11 (*)    All other components within normal limits  BASIC METABOLIC PANEL - Abnormal; Notable for the following components:   Chloride 94 (*)    CO2 36 (*)    Glucose, Bld 121 (*)    Creatinine, Ser 0.39 (*)    All other components within normal limits  CBC - Abnormal; Notable for the following components:   RBC 3.43 (*)    Hemoglobin 9.9 (*)    HCT 33.2 (*)    MCHC 29.8 (*)    Platelets 439 (*)    All other components within normal limits    EKG EKG Interpretation  Date/Time:  Monday December 10 2018 13:14:43 EST Ventricular Rate:  121 PR Interval:    QRS Duration: 94 QT Interval:  318 QTC Calculation: 452 R Axis:   111 Text Interpretation:  Sinus tachycardia Ventricular premature complex LAE, consider biatrial enlargement RVH with secondary repolarization abnrm Probable anterolateral infarct, old Minimal ST elevation, inferior leads Artifact in lead(s) I III aVR aVL aVF V1 V2 V3 V4 V5 V6 givien limitations of artifact, no obvious ST changes compared to may 2019 Confirmed by Merrily Pew [phone]) on 12/10/2018 3:25:49 PM   Radiology Dg Chest Portable 1 View  Result Date: 12/10/2018 CLINICAL DATA:  Shortness of breath, chest pain EXAM: PORTABLE CHEST 1 VIEW COMPARISON:  11/27/2018 FINDINGS: The lungs are hyperinflated likely secondary to COPD. There is no focal parenchymal opacity. There is no pleural effusion or pneumothorax. The heart and mediastinal contours are unremarkable. The osseous structures are unremarkable.  IMPRESSION: No active disease. Electronically Signed   By: Kathreen Devoid   On: 12/10/2018 13:39    Procedures Procedures (including critical care time)  CRITICAL CARE Performed by: Merrily Pew Total critical care time: 35 minutes Critical care time was exclusive of separately billable procedures and treating other patients. Critical care was necessary to treat or prevent imminent or life-threatening deterioration. Critical care was time spent personally by me on the following activities: development of treatment plan with patient and/or surrogate as well as nursing, discussions with consultants, evaluation of patient's response to treatment, examination of patient, obtaining history from patient or surrogate, ordering and performing treatments and interventions, ordering and review of laboratory studies, ordering and review of radiographic studies, pulse oximetry and re-evaluation of patient's condition.   Medications Ordered in ED Medications  albuterol (PROVENTIL,VENTOLIN) solution continuous neb (10 mg/hr Nebulization New Bag/Given 12/10/18 1331)  acetaminophen (TYLENOL) tablet 500 mg (500 mg Oral Given 12/10/18 2139)  docusate sodium (COLACE) capsule 100 mg (100 mg Oral Given 12/11/18 0955)  cholecalciferol (VITAMIN D3) tablet 2,000 Units (2,000 Units Oral Given 12/11/18 0955)  vitamin C (ASCORBIC ACID) tablet 500 mg (500 mg Oral Given 12/11/18 0955)  albuterol (PROVENTIL) (2.5 MG/3ML) 0.083% nebulizer solution 2.5 mg (has no administration in time range)  guaiFENesin-dextromethorphan (ROBITUSSIN DM) 100-10 MG/5ML syrup 15 mL (15 mLs Oral Given 12/10/18 2152)  fluticasone furoate-vilanterol (BREO ELLIPTA) 200-25 MCG/INH 1 puff (1 puff Inhalation Given 12/11/18 0826)  heparin injection 5,000 Units (5,000 Units Subcutaneous Given 12/11/18 0610)  pantoprazole (PROTONIX) EC tablet 40 mg (40 mg Oral Given 12/11/18 0955)  methylPREDNISolone sodium succinate (SOLU-MEDROL) 40 mg/mL injection 40 mg (  40 mg  Intravenous Given 12/11/18 0608)  umeclidinium bromide (INCRUSE ELLIPTA) 62.5 MCG/INH 1 puff (1 puff Inhalation Given 12/11/18 0826)  calcium-vitamin D (OSCAL WITH D) 500-200 MG-UNIT per tablet 1 tablet (1 tablet Oral Given 12/11/18 0955)  ipratropium-albuterol (DUONEB) 0.5-2.5 (3) MG/3ML nebulizer solution 3 mL (3 mLs Nebulization Given 12/11/18 0825)  methylPREDNISolone sodium succinate (SOLU-MEDROL) 125 mg/2 mL injection 125 mg (125 mg Intravenous Given 12/10/18 1422)  ipratropium (ATROVENT) nebulizer solution 1 mg (1 mg Nebulization Given 12/10/18 1331)  azithromycin (ZITHROMAX) 500 mg in sodium chloride 0.9 % 250 mL IVPB (0 mg Intravenous Stopped 12/10/18 1620)  lactated ringers bolus 1,000 mL (0 mLs Intravenous Stopped 12/10/18 1752)     Initial Impression / Assessment and Plan / ED Course  I have reviewed the triage vital signs and the nursing notes.  Pertinent labs & imaging results that were available during my care of the patient were reviewed by me and considered in my medical decision making (see chart for details).   Likely COPD exacerbation.  Diffuse wheezing diminished breath sounds not much improved after breathing treatments, steroids antibiotics here.  Discussed with hospitalist who will admit.  Final Clinical Impressions(s) / ED Diagnoses   Final diagnoses:  COPD exacerbation [facility])    ED Discharge Orders    None       Donie Lemelin, Corene Cornea, MD 12/11/18 1125

## 2018-12-11 NOTE — Evaluation (Signed)
Physical Therapy Evaluation Patient Details Name: Sheila Mcconnell MRN: [id number] DOB: 24-May-1927 Today's Date: 12/11/2018   History of Present Illness  Pt is 85 y/o female presenting from SNF with COPD exacerbation. She was at SNF 2 recent falls with acute ventral and medially displaced fx of L iliac bone, left wrist fracture and left superior and inferior pubic rami fracture. PMH significant for medical history significant of severe COPD, chronic hypoxic respiratory failure, GERD, hypertension, dyslipidemia, dependence on oxygen, HOH, and osteoporosis.  Clinical Impression  Pt admitted with above diagnosis. Pt currently with functional limitations due to the deficits listed below (see PT Problem List). At the time of PT eval pt was able to perform transfers with up to +2 min assist for balance support and safety. Pt limited by fear of falling. Question baseline cognition - pt minimally verbalizing and using gestures more than words. Pt unable to tell me what she's been able to do with therapy at SNF or why she hasn't been attempting to ambulate with therapy; only states she has a fear of heights. Pt will benefit from skilled PT to increase their independence and safety with mobility to allow discharge to the venue listed below.       Follow Up Recommendations SNF    Equipment Recommendations  None recommended by PT    Recommendations for Other Services       Precautions / Restrictions Precautions Precautions: Fall Precaution Comments: Pt with minimal verbalizations throughout session. Gesturing at times instead of speaking. Restrictions Weight Bearing Restrictions: No LUE Weight Bearing: Weight bearing as tolerated      Mobility  Bed Mobility Overal bed mobility: Needs Assistance Bed Mobility: Supine to Sit     Supine to sit: Mod assist;HOB elevated;+2 for safety/equipment     General bed mobility comments: +2 utilized for safety but feel she could have completed with +1 assist.  VC's for use of RUE on railing for support.   Transfers Overall transfer level: Needs assistance Equipment used: 2 person hand held assist Transfers: Sit to/from [facility] Sit to Stand: Min assist;+2 physical assistance Stand pivot transfers: Min assist;+2 physical assistance       General transfer comment: Initially stood EOB and pt immediately asked to sit back down - states she has "a fear of heights". On second attempt, pt was able to tolerate a SPT to chair with +2 assist but appeared very guarded and fearful of falling.   Ambulation/Gait             General Gait Details: Unable  Stairs            Wheelchair Mobility    Modified Rankin (Stroke Patients Only)       Balance Overall balance assessment: Needs assistance Sitting-balance support: Feet supported;No upper extremity supported Sitting balance-Leahy Scale: Fair     Standing balance support: Bilateral upper extremity supported;During functional activity Standing balance-Leahy Scale: Poor Standing balance comment: Reliant on UE support during standing                             Pertinent Vitals/Pain Pain Assessment: No/denies pain Faces Pain Scale: No hurt    Home Living Family/patient expects to be discharged to:: Skilled nursing facility                      Prior Function Level of Independence: Needs assistance      ADL's / Homemaking Assistance Needed:  assistance with meals  Comments: Pt reports she was not walking with therapy PTA. Unclear if this is accurate, and if so, why the pt wasn't walking.      Hand Dominance   Dominant Hand: Right    Extremity/Trunk Assessment   Upper Extremity Assessment Upper Extremity Assessment: LUE deficits/detail LUE Deficits / Details: splint in place 2* recent wrist fx    Lower Extremity Assessment Lower Extremity Assessment: Generalized weakness    Cervical / Trunk Assessment Cervical / Trunk  Assessment: Kyphotic  Communication   Communication: HOH  Cognition Arousal/Alertness: Awake/alert Behavior During Therapy: Flat affect Overall Cognitive Status: Within Functional Limits for tasks assessed                                        General Comments      Exercises     Assessment/Plan    PT Assessment Patient needs continued PT services  PT Problem List Decreased strength;Decreased range of motion;Decreased activity tolerance;Decreased balance;Decreased mobility;Decreased knowledge of use of DME;Pain       PT Treatment Interventions DME instruction;Gait training;Functional mobility training;Therapeutic activities;Therapeutic exercise;Balance training;Patient/family education    PT Goals (Current goals can be found in the Care Plan section)  Acute Rehab PT Goals Patient Stated Goal: Hurt less PT Goal Formulation: With patient Time For Goal Achievement: 12/25/18 Potential to Achieve Goals: Fair    Frequency Min 2X/week   Barriers to discharge        Co-evaluation PT/OT/SLP Co-Evaluation/Treatment: Yes Reason for Co-Treatment: To address functional/ADL transfers;For patient/therapist safety PT goals addressed during session: Mobility/safety with mobility;Balance         AM-PAC PT "6 Clicks" Mobility  Outcome Measure Help needed turning from your back to your side while in a flat bed without using bedrails?: A Lot Help needed moving from lying on your back to sitting on the side of a flat bed without using bedrails?: A Lot Help needed moving to and from a bed to a chair (including a wheelchair)?: A Lot Help needed standing up from a chair using your arms (e.g., wheelchair or bedside chair)?: A Lot Help needed to walk in hospital room?: Total Help needed climbing 3-5 steps with a railing? : Total 6 Click Score: 10    End of Session Equipment Utilized During Treatment: Gait belt Activity Tolerance: Other (comment)(Limited by fear of  falling) Patient left: in chair;with call bell/phone within reach;with chair alarm set Nurse Communication: Mobility status PT Visit Diagnosis: Difficulty in walking, not elsewhere classified (R26.2);History of falling (Z91.81);Repeated falls (R29.6);Pain Pain - Right/Left: Left    Time: 8889-1694 PT Time Calculation (min) (ACUTE ONLY): 23 min   Charges:   PT Evaluation $PT Eval Moderate Complexity: 1 Mod          Rolinda Roan, PT, DPT Acute Rehabilitation Services Pager: [phone] Office: [phone]   Thelma Comp 12/11/2018, 11:19 AM

## 2018-12-11 NOTE — Progress Notes (Signed)
Progress Note    Sheila Mcconnell  XBM:[id number] DOB: [birth year]  DOA: 12/10/2018 PCP: Ma Hillock, DO    Brief Narrative:     Medical records reviewed and are as summarized below:  Sheila Mcconnell is an 85 y.o. female with past medical history of hypertension, COPD, anemia, chronic respiratory failure at 2 L baseline, patient with recent hospitalization for fall, with pelvic fracture, discharged to rehab at [facility] place, patient presents from facility with complaints of shortness of breath, reportedly developed on day of admission.    Assessment/Plan:   Active Problems:   Emphysema lung (HCC)   Oxygen dependent   Hypertension   Chronic hypercapnic respiratory failure (HCC)   Malnutrition of moderate degree   Left wrist fracture   Pelvic fracture (HCC)   COPD with acute exacerbation (HCC)   COPD exacerbation (HCC)   Acute exacerbation of chronic obstructive pulmonary disease (COPD) (HCC)  Acute SOB-- r/o PE vs COPD exacerbation -with fractures, not as ambulatory as prior and mild tachycardia (could be breathing treatment)-- r/o PE with d dimer (less likely but on the differential) -Patient with diminished air entry, wheezing, will continue with Solu-Medrol 40 mg IV every 12 hours, scheduled duo nebs, and PRN albuterol, continue with home meds Symbicort and Spiriva    Chronic respiratory failure  - on 2 L nasal cannula   History of falls, with recent pelvic fracture,/left wrist fracture - follow with Dr. Marcelino Scot-- admitting Dr notified his office of hospitalization -Consulted PT/OT  Hypertension -BP medications stopped during last hospitalization  protein calorie malnutrition - Ensure   Family Communication/Anticipated D/C date and plan/Code Status   DVT prophylaxis: heparin Code Status: dnr Family Communication: none at bedside Disposition Plan: back to SNF in AM?     Subjective:   Still feels short of breath  Objective:    Vitals:   12/10/18  2208 12/11/18 0500 12/11/18 0826 12/11/18 1445  BP:  124/69  122/60  Pulse:  87  (!) 110  Resp:  20  18  Temp:  97.8 F (36.6 C)  97.9 F (36.6 C)  TempSrc:  Oral  Oral  SpO2: 98% 93% 94% 100%    Intake/Output Summary (Last 24 hours) at 12/11/2018 1609 Last data filed at 12/10/2018 2200 Gross per 24 hour  Intake 240 ml  Output -  Net 240 ml   There were no vitals filed for this visit.  Exam: Chronically ill appearing, wearing O2 HR fast but feels regular Awake but appears scared-- answer questions with 1 word Left leg rotated inward No swelling Breath sounds diminished but poor effort given by patient  Data Reviewed:   I have personally reviewed following labs and imaging studies:  Labs: Labs show the following:   Basic Metabolic Panel: Recent Labs  Lab 12/10/18 1400 12/11/18 0527  NA 138 138  K 3.9 4.6  CL 90* 94*  CO2 39* 36*  GLUCOSE 118* 121*  BUN 6* 8  CREATININE 0.45 0.39*  CALCIUM 9.4 9.2   GFR Estimated Creatinine Clearance: 32.4 mL/min (A) (by C-G formula based on SCr of 0.39 mg/dL (L)). Liver Function Tests: Recent Labs  Lab 12/10/18 1400  AST 11*  ALT 8  ALKPHOS 96  BILITOT 0.9  PROT 5.8*  ALBUMIN 2.7*   No results for input(s): LIPASE, AMYLASE in the last 168 hours. No results for input(s): AMMONIA in the last 168 hours. Coagulation profile No results for input(s): INR, PROTIME in the last 168 hours.  CBC:  Recent Labs  Lab 12/10/18 1400 12/11/18 0527  WBC 15.0* 10.4  NEUTROABS 12.5*  --   HGB 10.5* 9.9*  HCT 35.7* 33.2*  MCV 98.6 96.8  PLT 451* 439*   Cardiac Enzymes: No results for input(s): CKTOTAL, CKMB, CKMBINDEX, TROPONINI in the last 168 hours. BNP (last 3 results) No results for input(s): PROBNP in the last 8760 hours. CBG: No results for input(s): GLUCAP in the last 168 hours. D-Dimer: No results for input(s): DDIMER in the last 72 hours. Hgb A1c: No results for input(s): HGBA1C in the last 72 hours. Lipid  Profile: No results for input(s): CHOL, HDL, LDLCALC, TRIG, CHOLHDL, LDLDIRECT in the last 72 hours. Thyroid function studies: No results for input(s): TSH, T4TOTAL, T3FREE, THYROIDAB in the last 72 hours.  Invalid input(s): FREET3 Anemia work up: No results for input(s): VITAMINB12, FOLATE, FERRITIN, TIBC, IRON, RETICCTPCT in the last 72 hours. Sepsis Labs: Recent Labs  Lab 12/10/18 1400 12/11/18 0527  WBC 15.0* 10.4    Microbiology No results found for this or any previous visit (from the past 240 hour(s)).  Procedures and diagnostic studies:  Dg Pelvis Comp Min 3v  Result Date: 12/11/2018 CLINICAL DATA:  Follow-up pelvic fractures EXAM: JUDET PELVIS - 3+ VIEW COMPARISON:  CT from 11/27/2018 FINDINGS: Healing fractures involving the junction of the left superior previous ramus and acetabulum as well as the inferior pubic ramus are seen similar to that noted on recent CT examination. The left iliac bone fracture is again identified and stable with mild displacement of fracture fragments identified no new fractures are seen. No gross soft tissue abnormality is noted. IMPRESSION: Stable appearing fractures involving the left iliac wing as well as the superior and inferior pubic rami on the left similar to that seen on prior CT examination. Electronically Signed   By: Inez Catalina M.D.   On: 12/11/2018 14:05   Dg Chest Portable 1 View  Result Date: 12/10/2018 CLINICAL DATA:  Shortness of breath, chest pain EXAM: PORTABLE CHEST 1 VIEW COMPARISON:  11/27/2018 FINDINGS: The lungs are hyperinflated likely secondary to COPD. There is no focal parenchymal opacity. There is no pleural effusion or pneumothorax. The heart and mediastinal contours are unremarkable. The osseous structures are unremarkable. IMPRESSION: No active disease. Electronically Signed   By: Kathreen Devoid   On: 12/10/2018 13:39    Medications:   . calcium-vitamin D  1 tablet Oral BID  . cholecalciferol  2,000 Units Oral BID    . docusate sodium  100 mg Oral BID  . fluticasone furoate-vilanterol  1 puff Inhalation Daily  . heparin  5,000 Units Subcutaneous Q8H  . ipratropium-albuterol  3 mL Nebulization TID  . methylPREDNISolone (SOLU-MEDROL) injection  40 mg Intravenous Q8H  . mirtazapine  7.5 mg Oral QHS  . pantoprazole  40 mg Oral Daily  . umeclidinium bromide  1 puff Inhalation Daily  . ascorbic acid  500 mg Oral Daily   Continuous Infusions: . albuterol 10 mg/hr (12/10/18 1331)     LOS: 0 days   Geradine Girt  [facility]   *Please refer to [email or web address], password [email or web address] to get updated schedule on who will round on this patient, as hospitalists switch teams weekly. If 7PM-7AM, please contact night-coverage at [email or web address], password [email or web address] for any overnight needs.  12/11/2018, 4:09 PM

## 2018-12-11 NOTE — [place] FL2 (Signed)
[place] MEDICAID FL2 LEVEL OF CARE SCREENING TOOL     IDENTIFICATION  Patient Name: Sheila Mcconnell Birthdate: 07-16-1927 Sex: female Admission Date (Current Location): 12/10/2018  [facility] and [place] Number:  Herbalist and Address:  [facility], [place] [address], [place], [place] [address]      Provider Number: [id number]  Attending Physician Name and Address:  Geradine Girt, DO  Relative Name and Phone Number:  Kaylynn Chamblin, daughter, [phone]    Current Level of Care: Hospital Recommended Level of Care: [place] Prior Approval Number:    Date Approved/Denied:   PASRR Number: [id number]  Discharge Plan: SNF    Current Diagnoses: Patient Active Problem List   Diagnosis Date Noted  . COPD with acute exacerbation ([place]) 12/10/2018  . COPD exacerbation ([place]) 12/10/2018  . Pelvis fracture ([place]) 11/27/2018  . Pelvic fracture ([place]) 11/27/2018  . Left wrist fracture 10/21/2018  . Wrist fracture 10/21/2018  . Malnutrition of moderate degree 07/05/2018  . Goals of care, counseling/discussion   . Palliative care encounter   . Emphysema of lung ([place]) 07/04/2018  . Dyspnea on exertion 07/04/2018  . Chronic hypercapnic respiratory failure ([place]) 07/04/2018  . Emphysema lung ([place]) 06/27/2017  . Oxygen dependent 06/27/2017  . Hypertension 06/27/2017    Orientation RESPIRATION BLADDER Height & Weight     Self, Place  O2(nasal cannula 3L) Continent Weight:   Height:     BEHAVIORAL SYMPTOMS/MOOD NEUROLOGICAL BOWEL NUTRITION STATUS      Continent Diet(please see DC summary)  AMBULATORY STATUS COMMUNICATION OF NEEDS Skin   Extensive Assist Verbally Normal                       Personal Care Assistance Level of Assistance  Bathing, Feeding, Dressing Bathing Assistance: Limited assistance Feeding assistance: Independent Dressing Assistance: Limited assistance     Functional Limitations Info  Sight, Hearing,  Speech Sight Info: Impaired Hearing Info: Impaired Speech Info: Adequate    SPECIAL CARE FACTORS FREQUENCY  PT (By licensed PT), OT (By licensed OT)     PT Frequency: 5x/week OT Frequency: 5x/week            Contractures Contractures Info: Not present    Additional Factors Info  Code Status, Allergies Code Status Info: DNR Allergies Info: Sulfa Antibiotics           Current Medications (12/11/2018):  This is the current hospital active medication list Current Facility-Administered Medications  Medication Dose Route Frequency Provider Last Rate Last Dose  . acetaminophen (TYLENOL) tablet 500 mg  500 mg Oral Q6H PRN Elgergawy, Silver Huguenin, MD   500 mg at 12/10/18 2139  . albuterol (PROVENTIL) (2.5 MG/3ML) 0.083% nebulizer solution 2.5 mg  2.5 mg Nebulization Q6H PRN Elgergawy, Silver Huguenin, MD      . albuterol (PROVENTIL,VENTOLIN) solution continuous neb  10 mg/hr Nebulization Continuous Elgergawy, Silver Huguenin, MD 2 mL/hr at 12/10/18 1331 10 mg/hr at 12/10/18 1331  . calcium-vitamin D (OSCAL WITH D) 500-200 MG-UNIT per tablet 1 tablet  1 tablet Oral BID Elgergawy, Silver Huguenin, MD   1 tablet at 12/11/18 0955  . cholecalciferol (VITAMIN D3) tablet 2,000 Units  2,000 Units Oral BID Elgergawy, Silver Huguenin, MD   2,000 Units at 12/11/18 0955  . docusate sodium (COLACE) capsule 100 mg  100 mg Oral BID Elgergawy, Silver Huguenin, MD   100 mg at 12/11/18 0955  . fluticasone furoate-vilanterol (BREO ELLIPTA) 200-25 MCG/INH 1  puff  1 puff Inhalation Daily Elgergawy, Silver Huguenin, MD   1 puff at 12/11/18 [id number]  . guaiFENesin-dextromethorphan (ROBITUSSIN DM) 100-10 MG/5ML syrup 15 mL  15 mL Oral Q4H PRN Elgergawy, Silver Huguenin, MD   15 mL at 12/10/18 2152  . heparin injection 5,000 Units  5,000 Units Subcutaneous Q8H Elgergawy, Silver Huguenin, MD   5,000 Units at 12/11/18 0610  . ipratropium-albuterol (DUONEB) 0.5-2.5 (3) MG/3ML nebulizer solution 3 mL  3 mL Nebulization TID Elgergawy, Silver Huguenin, MD   3 mL at 12/11/18 0825  .  methylPREDNISolone sodium succinate (SOLU-MEDROL) 40 mg/mL injection 40 mg  40 mg Intravenous Q8H Elgergawy, Silver Huguenin, MD   40 mg at 12/11/18 0608  . pantoprazole (PROTONIX) EC tablet 40 mg  40 mg Oral Daily Elgergawy, Silver Huguenin, MD   40 mg at 12/11/18 0955  . umeclidinium bromide (INCRUSE ELLIPTA) 62.5 MCG/INH 1 puff  1 puff Inhalation Daily Elgergawy, Silver Huguenin, MD   1 puff at 12/11/18 0826  . vitamin C (ASCORBIC ACID) tablet 500 mg  500 mg Oral Daily Elgergawy, Silver Huguenin, MD   500 mg at 12/11/18 1093     Discharge Medications: Please see discharge summary for a list of discharge medications.  Relevant Imaging Results:  Relevant Lab Results:   Additional Information SSN: [id number]  Estanislado Emms, LCSW

## 2018-12-11 NOTE — Clinical Social Work Note (Signed)
Clinical Social Work Assessment  Patient Details  Name: Sheila Mcconnell MRN: [id number] Date of Birth: 1927/07/21  Date of referral:  12/11/18               Reason for consult:  Discharge Planning, Facility Placement                Permission sought to share information with:  Facility Sport and exercise psychologist, Family Supports Permission granted to share information::     Name::     Sheila Mcconnell  Agency::  [facility]  Relationship::  daughter  Contact Information:  [phone]  Housing/Transportation Living arrangements for the past 2 months:  [place], [place] of Information:  Adult Children Patient Interpreter Needed:  None Criminal Activity/Legal Involvement Pertinent to Current Situation/Hospitalization:  No - Comment as needed Significant Relationships:  Adult Children Lives with:  Facility Resident Do you feel safe going back to the place where you live?  Yes Need for family participation in patient care:  Yes (Comment)  Care giving concerns: Patient from [facility] for rehab. Prior to that was in ALF. PT recommending SNF.   Social Worker assessment / plan: CSW spoke to patient's daughter, Sheila Mcconnell on the phone. Per daughter, patient was at [facility] for rehab and daughter would like her to return there to continue rehab.  CSW confirmed with [facility] that they will accept patient back for continued rehab.  CSW will follow for medical readiness and support with discharge planning.  Employment status:  Retired Forensic scientist:  Medicare PT Recommendations:  [place] / Referral to community resources:  [place]  Patient/Family's Response to care: Patient's daughter appreciative of care.  Patient/Family's Understanding of and Emotional Response to Diagnosis, Current Treatment, and Prognosis: Daughter with understanding of patient's care needs and agreeable for patient to return to SNF.  Emotional  Assessment Appearance:  Appears stated age Attitude/Demeanor/Rapport:  Unable to Assess Affect (typically observed):  Unable to Assess Orientation:  Oriented to Self, Oriented to Place Alcohol / Substance use:  Not Applicable Psych involvement (Current and /or in the community):  No (Comment)  Discharge Needs  Concerns to be addressed:  Discharge Planning Concerns, Care Coordination Readmission within the last 30 days:  Yes Current discharge risk:  Physical Impairment Barriers to Discharge:  Continued Medical Work up   Estanislado Emms, LCSW 12/11/2018, 12:25 PM

## 2018-12-11 NOTE — Progress Notes (Signed)
Patient arrived to unit, alert oriented x 2. Daughter at bedside. MD orders reviewed with patient and daughter. Bed alarm activated. Call bell within reach of patient.

## 2018-12-12 ENCOUNTER — Inpatient Hospital Stay (HOSPITAL_COMMUNITY): Payer: Medicare Other

## 2018-12-12 LAB — GLUCOSE, CAPILLARY
Glucose-Capillary: 124 mg/dL — ABNORMAL HIGH (ref 70–99)
Glucose-Capillary: 118 mg/dL — ABNORMAL HIGH (ref 70–99)
Glucose-Capillary: 126 mg/dL — ABNORMAL HIGH (ref 70–99)
Glucose-Capillary: 115 mg/dL — ABNORMAL HIGH (ref 70–99)
Glucose-Capillary: 117 mg/dL — ABNORMAL HIGH (ref 70–99)

## 2018-12-12 LAB — CBC
HCT: 34.4 % — ABNORMAL LOW (ref 36.0–46.0)
HEMOGLOBIN: 10.1 g/dL — AB (ref 12.0–15.0)
MCH: 28.6 pg (ref 26.0–34.0)
MCHC: 29.4 g/dL — ABNORMAL LOW (ref 30.0–36.0)
MCV: 97.5 fL (ref 80.0–100.0)
Platelets: 475 10*3/uL — ABNORMAL HIGH (ref 150–400)
RBC: 3.53 MIL/uL — ABNORMAL LOW (ref 3.87–5.11)
RDW: 13.9 % (ref 11.5–15.5)
WBC: 8 10*3/uL (ref 4.0–10.5)
nRBC: 0 % (ref 0.0–0.2)

## 2018-12-12 LAB — BASIC METABOLIC PANEL
Anion gap: 7 (ref 5–15)
BUN: 10 mg/dL (ref 8–23)
CHLORIDE: 94 mmol/L — AB (ref 98–111)
CO2: 38 mmol/L — ABNORMAL HIGH (ref 22–32)
Calcium: 8.9 mg/dL (ref 8.9–10.3)
Creatinine, Ser: 0.44 mg/dL (ref 0.44–1.00)
GFR calc Af Amer: >60 mL/min (ref >60)
GFR calc non Af Amer: >60 mL/min (ref >60)
Glucose, Bld: 120 mg/dL — ABNORMAL HIGH (ref 70–99)
Potassium: 4.4 mmol/L (ref 3.5–5.1)
Sodium: 139 mmol/L (ref 135–145)

## 2018-12-12 LAB — MRSA PCR SCREENING: MRSA by PCR: NEGATIVE

## 2018-12-12 MED ORDER — IOPAMIDOL (ISOVUE-370) INJECTION 76%
100.0000 mL | Freq: Once | INTRAVENOUS | Status: AC | PRN
  Administered 2018-12-13: 50 mL via INTRAVENOUS

## 2018-12-12 MED ORDER — IOPAMIDOL (ISOVUE-370) INJECTION 76%
INTRAVENOUS | Status: AC
  Filled 2018-12-12: qty 100

## 2018-12-12 MED ORDER — IPRATROPIUM-ALBUTEROL 0.5-2.5 (3) MG/3ML IN SOLN
3.0000 mL | Freq: Two times a day (BID) | RESPIRATORY_TRACT | Status: DC
  Administered 2018-12-13: 3 mL via RESPIRATORY_TRACT
  Filled 2018-12-12 (×2): qty 3

## 2018-12-12 NOTE — Progress Notes (Signed)
Progress Note    Sheila Mcconnell  WCB:[id number] DOB: 05/22/1927  DOA: 12/10/2018 PCP: Ma Hillock, DO    Brief Narrative:     Medical records reviewed and are as summarized below:  Sheila Mcconnell is an 85 y.o. female with past medical history of hypertension, COPD, anemia, chronic respiratory failure at 2 L baseline, patient with recent hospitalization for fall, with pelvic fracture, discharged to rehab at [facility] place, patient presents from facility with complaints of shortness of breath, reportedly developed on day of admission.    Assessment/Plan:   Active Problems:   Emphysema lung (HCC)   Oxygen dependent   Hypertension   Chronic hypercapnic respiratory failure (HCC)   Malnutrition of moderate degree   Left wrist fracture   Pelvic fracture (HCC)   COPD with acute exacerbation (HCC)   COPD exacerbation (HCC)   Acute exacerbation of chronic obstructive pulmonary disease (COPD) (HCC)  Acute SOB-- r/o PE vs COPD exacerbation -with fractures, not as ambulatory as prior and mild tachycardia (could be breathing treatment)-- r/o PE with d dimer (less likely but on the differential) - Continue with Solu-Medrol 40 mg IV every 12 hours, scheduled duo nebs, and PRN albuterol, continue with home meds Symbicort and Spiriva -D-dimer elevated overnight, follow CTA chest results. -Clinically improving.  Chronic respiratory failure  - on 2 L nasal cannula .  Stable and at baseline oxygen needs.  History of falls, with recent pelvic fracture,/left wrist fracture - follows with Dr. Marcelino [place] -Consulted PT/OT and recommend SNF at discharge. -Orthopedic follow-up 1/8 appreciated: Follow-up pelvic x-ray shows stable fracture pattern to left iliac wing, healing rami fractures from subacute pelvic ring fracture and see their recommendations as follows:  "No restrictions from Ortho standpoint with respect to pelvic ring fracture   WBAT through L elbow WBAT B LEx ROM as tolerated B  LEx  Follow up with ortho in clinic in 3-4 weeks"  Hypertension -BP medications stopped during last hospitalization.  Controlled.  Protein calorie malnutrition - Ensure  Normocytic anemia ?  Chronic disease.  Stable.   Family Communication/Anticipated D/C date and plan/Code Status   DVT prophylaxis: heparin subcutaneous Code Status: DNR Family Communication: none at bedside Disposition Plan: SNF pending clinical improvement, possibly 1/9     Subjective:   Patient reported that prior to recent hospitalization she lived alone, was independent, used home oxygen 2 L/min.  Indicates that her breathing is better compared to yesterday but not yet at baseline.  Objective:    Vitals:   12/12/18 0730 12/12/18 0905 12/12/18 1626 12/12/18 1700  BP: 122/64   115/71  Pulse: 100  91   Resp: (!) 21  19   Temp: 98.3 F (36.8 C)  97.9 F (36.6 C)   TempSrc: Oral  Oral   SpO2: 97% 98%  99%  Weight:      Height:        Intake/Output Summary (Last 24 hours) at 12/12/2018 1755 Last data filed at 12/12/2018 1300 Gross per 24 hour  Intake 546 ml  Output -  Net 546 ml   Filed Weights   12/11/18 2025 12/12/18 0100  Weight: 44 kg 44.3 kg    Exam: General exam: Pleasant elderly female, small built and frail lying comfortably propped up in bed. Respiratory system: Slightly harsh breath sounds bilaterally with occasional rhonchi but no crackles.  No increased work of breathing.  Able to speak in full sentences. Cardiovascular system: S1 and S2 heard, RRR.  No JVD, murmurs  or pedal edema. Abdominal exam: Nondistended, soft and nontender.  No organomegaly or masses appreciated.  Normal bowel sounds heard. CNS: Alert and oriented x2.  No focal neurological deficits. Extremities: Left upper extremity in cast.  Symmetric 5 x 5 power.  Data Reviewed:   I have personally reviewed following labs and imaging studies:  Labs: Labs show the following:   Basic Metabolic Panel: Recent  Labs  Lab 12/10/18 1400 12/11/18 0527 12/12/18 0643  NA 138 138 139  K 3.9 4.6 4.4  CL 90* 94* 94*  CO2 39* 36* 38*  GLUCOSE 118* 121* 120*  BUN 6* 8 10  CREATININE 0.45 0.39* 0.44  CALCIUM 9.4 9.2 8.9   GFR Estimated Creatinine Clearance: 33.3 mL/min (by C-G formula based on SCr of 0.44 mg/dL). Liver Function Tests: Recent Labs  Lab 12/10/18 1400  AST 11*  ALT 8  ALKPHOS 96  BILITOT 0.9  PROT 5.8*  ALBUMIN 2.7*    CBC: Recent Labs  Lab 12/10/18 1400 12/11/18 0527 12/12/18 0643  WBC 15.0* 10.4 8.0  NEUTROABS 12.5*  --   --   HGB 10.5* 9.9* 10.1*  HCT 35.7* 33.2* 34.4*  MCV 98.6 96.8 97.5  PLT 451* 439* 475*   CBG: Recent Labs  Lab 12/12/18 0122 12/12/18 0729 12/12/18 1116 12/12/18 1624  GLUCAP 124* 118* 126* 115*   D-Dimer: Recent Labs    12/11/18 2130  DDIMER 1.54*     Microbiology Recent Results (from the past 240 hour(s))  MRSA PCR Screening     Status: None   Collection Time: 12/12/18 11:42 AM  Result Value Ref Range Status   MRSA by PCR NEGATIVE NEGATIVE Final    Comment:        The GeneXpert MRSA Assay (FDA approved for NASAL specimens only), is one component of a comprehensive MRSA colonization surveillance program. It is not intended to diagnose MRSA infection nor to guide or monitor treatment for MRSA infections. Performed at [facility], [place] [address], [place], [place] [address]     Procedures and diagnostic studies:  Dg Wrist Complete Left  Result Date: 12/12/2018 CLINICAL DATA:  85 year old female with a history of fall and fracture EXAM: LEFT WRIST - COMPLETE 3+ VIEW COMPARISON:  10/23/2018 FINDINGS: Casting material on the left wrist obscures bony details. Osteopenia. Fracture site of the distal radius and ulna incompletely evaluated, however, there is evidence of callus formation at the distal radius and ulna. Lateral view demonstrates persistent dorsal angulation at the distal ulnar fracture site. IMPRESSION:  Casting material obscures bony details, however, there is evidence of interval callus formation and partial remodeling at the known distal radius and ulnar fractures. Alignment is relatively unchanged compared to the prior plain film. Electronically Signed   By: Corrie Mckusick D.O.   On: 12/12/2018 09:28   Dg Pelvis Comp Min 3v  Result Date: 12/11/2018 CLINICAL DATA:  Follow-up pelvic fractures EXAM: JUDET PELVIS - 3+ VIEW COMPARISON:  CT from 11/27/2018 FINDINGS: Healing fractures involving the junction of the left superior previous ramus and acetabulum as well as the inferior pubic ramus are seen similar to that noted on recent CT examination. The left iliac bone fracture is again identified and stable with mild displacement of fracture fragments identified no new fractures are seen. No gross soft tissue abnormality is noted. IMPRESSION: Stable appearing fractures involving the left iliac wing as well as the superior and inferior pubic rami on the left similar to that seen on prior CT examination.  Electronically Signed   By: Inez Catalina M.D.   On: 12/11/2018 14:05    Medications:   . calcium-vitamin D  1 tablet Oral BID  . cholecalciferol  2,000 Units Oral BID  . docusate sodium  100 mg Oral BID  . fluticasone furoate-vilanterol  1 puff Inhalation Daily  . heparin  5,000 Units Subcutaneous Q8H  . iopamidol      . iopamidol      . ipratropium-albuterol  3 mL Nebulization BID  . methylPREDNISolone (SOLU-MEDROL) injection  40 mg Intravenous Q8H  . mirtazapine  7.5 mg Oral QHS  . pantoprazole  40 mg Oral Daily  . umeclidinium bromide  1 puff Inhalation Daily  . ascorbic acid  500 mg Oral Daily   Continuous Infusions:    LOS: 1 day   Vernell Leep, MD, FACP, [facility]. [facility] Pager [phone]  If 7PM-7AM, please contact night-coverage [email or web address] Password [facility] 12/12/2018, 6:03 PM

## 2018-12-12 NOTE — Consult Note (Signed)
[place] Nurse wound consult note Reason for Consult: Two full thickness skin tears on right forearm. Flap able to be replaced on proximal to 75% coverage.  Avulsion on distal arm. Wound type: Trauma Pressure Injury POA: N/A Measurement: Proximal:  4cm x 3.2cm full thickness flap with red, moist wound bed and ecchymosis surrounding. 75% flap re approximation, covered with steri strips.No drainage. Distal:  4.5cm x 2.4cm x 0.2cm yellow, dry wound bed, no drainage. Wound bed: As described above Drainage (amount, consistency, odor) As described above Periwound: ecchymotic, dry Dressing procedure/placement/frequency: I have provided nursing with guidance for care via the Orders, specifically to dress the proximal right arm wound with a dry dressing daily and to leave the steri strips in place until they fall off. The distal wound will require a daily dressing using xeroform gauze. Prophylactic measures are requested (sacral foam and floatation of heels) in addition to the turning and repositioning already in place.  [place] nursing team will not follow, but will remain available to this patient, the nursing and medical teams.  Please re-consult if needed. Thanks, Maudie Flakes, MSN, RN, [place], [place]  Pager# [phone]

## 2018-12-12 NOTE — Progress Notes (Addendum)
Orthopaedic Trauma Service  Follow up pelvic xrays show stable fracture pattern to L iliac wing. Healing rami fractures from subacute pelvic ring fracture  Check xrays of L distal radius   No restrictions from Ortho standpoint with respect to pelvic ring fracture   WBAT through L elbow WBAT B LEx ROM as tolerated B LEx  Follow up with ortho in clinic in 3-4 weeks  Jari Pigg, PA-C [phone] (C) 12/12/2018, 8:57 AM  [facility] [place] [address] [phone] Domingo Sep (F)

## 2018-12-13 ENCOUNTER — Inpatient Hospital Stay (HOSPITAL_COMMUNITY): Payer: Medicare Other

## 2018-12-13 ENCOUNTER — Ambulatory Visit: Payer: Medicare HMO | Admitting: Adult Health

## 2018-12-13 DIAGNOSIS — J9612 Chronic respiratory failure with hypercapnia: Secondary | ICD-10-CM | POA: Diagnosis not present

## 2018-12-13 DIAGNOSIS — R2689 Other abnormalities of gait and mobility: Secondary | ICD-10-CM | POA: Diagnosis not present

## 2018-12-13 DIAGNOSIS — R279 Unspecified lack of coordination: Secondary | ICD-10-CM | POA: Diagnosis not present

## 2018-12-13 DIAGNOSIS — J438 Other emphysema: Secondary | ICD-10-CM | POA: Diagnosis not present

## 2018-12-13 DIAGNOSIS — R41841 Cognitive communication deficit: Secondary | ICD-10-CM | POA: Diagnosis not present

## 2018-12-13 DIAGNOSIS — M6281 Muscle weakness (generalized): Secondary | ICD-10-CM | POA: Diagnosis not present

## 2018-12-13 DIAGNOSIS — F3489 Other specified persistent mood disorders: Secondary | ICD-10-CM | POA: Diagnosis not present

## 2018-12-13 DIAGNOSIS — S62102D Fracture of unspecified carpal bone, left wrist, subsequent encounter for fracture with routine healing: Secondary | ICD-10-CM | POA: Diagnosis not present

## 2018-12-13 DIAGNOSIS — E440 Moderate protein-calorie malnutrition: Secondary | ICD-10-CM

## 2018-12-13 DIAGNOSIS — W19XXXA Unspecified fall, initial encounter: Secondary | ICD-10-CM | POA: Diagnosis not present

## 2018-12-13 DIAGNOSIS — S32309A Unspecified fracture of unspecified ilium, initial encounter for closed fracture: Secondary | ICD-10-CM | POA: Diagnosis not present

## 2018-12-13 DIAGNOSIS — R278 Other lack of coordination: Secondary | ICD-10-CM | POA: Diagnosis not present

## 2018-12-13 DIAGNOSIS — J441 Chronic obstructive pulmonary disease with (acute) exacerbation: Secondary | ICD-10-CM | POA: Diagnosis not present

## 2018-12-13 DIAGNOSIS — I10 Essential (primary) hypertension: Secondary | ICD-10-CM | POA: Diagnosis not present

## 2018-12-13 DIAGNOSIS — I700 Atherosclerosis of aorta: Secondary | ICD-10-CM | POA: Diagnosis not present

## 2018-12-13 DIAGNOSIS — R918 Other nonspecific abnormal finding of lung field: Secondary | ICD-10-CM | POA: Diagnosis not present

## 2018-12-13 DIAGNOSIS — S329XXA Fracture of unspecified parts of lumbosacral spine and pelvis, initial encounter for closed fracture: Secondary | ICD-10-CM | POA: Diagnosis not present

## 2018-12-13 DIAGNOSIS — F39 Unspecified mood [affective] disorder: Secondary | ICD-10-CM | POA: Diagnosis not present

## 2018-12-13 DIAGNOSIS — Z743 Need for continuous supervision: Secondary | ICD-10-CM | POA: Diagnosis not present

## 2018-12-13 DIAGNOSIS — R2681 Unsteadiness on feet: Secondary | ICD-10-CM | POA: Diagnosis not present

## 2018-12-13 DIAGNOSIS — J9620 Acute and chronic respiratory failure, unspecified whether with hypoxia or hypercapnia: Secondary | ICD-10-CM | POA: Diagnosis not present

## 2018-12-13 DIAGNOSIS — R7989 Other specified abnormal findings of blood chemistry: Secondary | ICD-10-CM | POA: Diagnosis not present

## 2018-12-13 DIAGNOSIS — S32810A Multiple fractures of pelvis with stable disruption of pelvic ring, initial encounter for closed fracture: Secondary | ICD-10-CM | POA: Diagnosis not present

## 2018-12-13 DIAGNOSIS — S329XXS Fracture of unspecified parts of lumbosacral spine and pelvis, sequela: Secondary | ICD-10-CM | POA: Diagnosis not present

## 2018-12-13 LAB — GLUCOSE, CAPILLARY
Glucose-Capillary: 106 mg/dL — ABNORMAL HIGH (ref 70–99)
Glucose-Capillary: 126 mg/dL — ABNORMAL HIGH (ref 70–99)
Glucose-Capillary: 111 mg/dL — ABNORMAL HIGH (ref 70–99)
Glucose-Capillary: 126 mg/dL — ABNORMAL HIGH (ref 70–99)

## 2018-12-13 MED ORDER — DOXYCYCLINE HYCLATE 100 MG PO TABS
100.0000 mg | ORAL_TABLET | Freq: Two times a day (BID) | ORAL | Status: DC
  Administered 2018-12-13 (×2): 100 mg via ORAL
  Filled 2018-12-13 (×2): qty 1

## 2018-12-13 MED ORDER — GUAIFENESIN ER 600 MG PO TB12
1200.0000 mg | ORAL_TABLET | Freq: Two times a day (BID) | ORAL | Status: DC
  Administered 2018-12-13 (×2): 1200 mg via ORAL
  Filled 2018-12-13 (×2): qty 2

## 2018-12-13 MED ORDER — DOXYCYCLINE HYCLATE 100 MG PO TABS: 100.0000 mg | ORAL_TABLET | Freq: Two times a day (BID) | ORAL | Status: AC

## 2018-12-13 MED ORDER — DOXYCYCLINE HYCLATE 100 MG PO TABS: 100.0000 mg | ORAL_TABLET | Freq: Two times a day (BID) | ORAL | Status: DC

## 2018-12-13 MED ORDER — GUAIFENESIN ER 600 MG PO TB12: 1200.0000 mg | ORAL_TABLET | Freq: Two times a day (BID) | ORAL | Status: AC

## 2018-12-13 MED ORDER — PREDNISONE 10 MG PO TABS: ORAL_TABLET | ORAL | Status: AC

## 2018-12-13 NOTE — Progress Notes (Signed)
Orthopedic Tech Progress Note Patient Details:  Sheila Mcconnell 10/23/1927 [id number]  Casting Type of Cast: Short arm cast Cast Location: lue Cast Material: Fiberglass Cast Intervention: Removal  Post Interventions Patient Tolerated: Well Instructions Provided: Care of device, Adjustment of device     Karolee Stamps 12/13/2018, 10:29 AM

## 2018-12-13 NOTE — Consult Note (Addendum)
[facility] Nurse wound follow up Refer to previous consult note on 1/8.  Requested to assess left arm today after cast was removed.  Wound type: Left wrist with full thickness wound; 5X3X.1cm, red and moist Drainage (amount, consistency, odor) small amt tan drainage, no odor Periwound: intact skin surrounding, multiple bruises and thin fragile skin. Dressing procedure/placement/frequency: Appearance and location are not consistent with a pressure injury. Suspect this was an abrasion which occurred at the same time as the fracture and also has moisture associated skin damage related to the previous cast, which has now been removed.   Plan: Xeroform gauze to promote drying and healing.  Discussed plan of care with patient but she does not appear to understand and there is no family present. Please re-consult if further assistance is needed.  Thank-you,  Julien Girt MSN, [place], [place], [place], [place]

## 2018-12-13 NOTE — Discharge Summary (Signed)
Physician Discharge Summary  Sheila Mcconnell TOI:[id number] DOB: 1927/09/30  PCP: Ma Hillock, DO  Admit date: 12/10/2018 Discharge date: 12/13/2018  Recommendations for Outpatient Follow-up:  1. MD at SNF in 3 days with repeat labs (CBC & BMP). 2. Dr. Altamese [place], Orthopedics in 1 week.  SNF to coordinate. 3. Dr. Howard Pouch, PCP upon discharge from SNF. 4. Consider chest physical therapy at SNF if possible.  Home Health: Patient being discharged to SNF. Equipment/Devices: TBD at SNF.  Discharge Condition: Improved and stable. CODE STATUS: DNR. Diet recommendation: Heart healthy diet.  Discharge Diagnoses:  Active Problems:   Emphysema lung (HCC)   Oxygen dependent   Hypertension   Chronic hypercapnic respiratory failure (HCC)   Malnutrition of moderate degree   Left wrist fracture   Pelvic fracture (HCC)   COPD with acute exacerbation (HCC)   COPD exacerbation (HCC)   Acute exacerbation of chronic obstructive pulmonary disease (COPD) ([place])   Brief Summary: Sheila Mcconnell is an 85 y.o. female with past medical history of hypertension, COPD, anemia, chronic respiratory failure at 2 L baseline, patient with recent hospitalization for fall, with pelvic fracture, discharged to rehab at [facility], patient presents from facility with complaints of shortness of breath, reportedly developed on day of admission.  Assessment and plan:  COPD exacerbation -with fractures, not as ambulatory as prior and mild tachycardia (could be breathing treatment)--there was clinical index of suspicion for pulmonary embolism.  D-dimer was checked and positive.  Hence followed up with CTA chest which was negative for PE. - Treated with Solu-Medrol 40 mg IV every 12 hours, scheduled duo nebs, and PRN albuterol, continue with home meds Symbicort and Spiriva -Clinically improved.  Bronchospasm has clinically resolved.  For some reason she received a dose of azithromycin in the ED but antibiotics were  never continued.  Transition to oral prednisone taper at discharge, added 5-day course of oral doxycycline. -CTA chest also showed retained mucus in the airways along with mucous plugging but patient seems asymptomatic of this.  Added Mucinex.  May consider chest physical therapy at SNF if possible.  Chronicrespiratory failure  - on 2 L nasal cannula.  Stable and at baseline oxygen needs.  Historyof falls, with recent pelvic fracture,/left wrist fracture - follows with Dr. Marcelino Scot -Consulted PT/OT and recommend SNF at discharge. -Orthopedic follow-up 1/8 appreciated: Follow-up pelvic x-ray shows stable fracture pattern to left iliac wing, healing rami fractures from subacute pelvic ring fracture and see their recommendations as follows:  "No restrictions from Ortho standpointwith respect to pelvic ring fracture   WBAT through L elbow WBAT B LEx ROM as tolerated B LEx"  I discussed with orthopedics team prior to discharge and they recommend outpatient follow-up with them in 1 week. Left wrist cast was removed today and patient was placed in a splint. WOC consulted for left wrist wound which as per orthopedic team has been present prior to cast placement and skin tear on right forearm.  Please follow wound care recommendations.  Hypertension -BP medications stopped during last hospitalization.  Controlled.  Protein caloriemalnutrition - Ensure  Normocytic anemia ?  Chronic disease.  Stable.  Pulmonary Nodules Suspected infectious or inflammatory in nature.  Treatment with antibiotics as indicated above.  May consider repeating imaging at some point if deemed necessary to follow-up.   Consultations:  Orthopedics  Procedures:  Left wrist cast was removed and splint placed.   Discharge Instructions  Discharge Instructions    Call MD for:  difficulty breathing, headache  or visual disturbances   Complete by:  As directed    Call MD for:  extreme fatigue   Complete  by:  As directed    Call MD for:  persistant dizziness or light-headedness   Complete by:  As directed    Call MD for:  temperature >100.4   Complete by:  As directed    Diet - low sodium heart healthy   Complete by:  As directed    Increase activity slowly   Complete by:  As directed        Medication List    STOP taking these medications   calcium citrate 950 MG tablet Commonly known as:  CALCITRATE - dosed in mg elemental calcium   ipratropium 17 MCG/ACT inhaler Commonly known as:  ATROVENT HFA   tiotropium 18 MCG inhalation capsule Commonly known as:  SPIRIVA     TAKE these medications   acetaminophen 500 MG tablet Commonly known as:  TYLENOL Take 500 mg by mouth every 6 (six) hours as needed for moderate pain.   albuterol (2.5 MG/3ML) 0.083% nebulizer solution Commonly known as:  PROVENTIL Take 2.5 mg by nebulization every 6 (six) hours as needed for wheezing.   ascorbic acid 500 MG tablet Commonly known as:  VITAMIN C Take 1 tablet (500 mg total) by mouth daily.   CALCIUM 600+D 600-400 MG-UNIT tablet Generic drug:  Calcium Carbonate-Vitamin D Take 1 tablet by mouth 2 (two) times daily.   CHLORASEPTIC TOTAL 04-09-09 MG Lozg Generic drug:  DM-Benzocaine-Menthol Use as directed 1 tablet in the mouth or throat daily as needed (sore throat).   cholecalciferol 1000 units tablet Commonly known as:  VITAMIN D Take 2 tablets (2,000 Units total) by mouth 2 (two) times daily.   docusate sodium 100 MG capsule Commonly known as:  COLACE Take 1 capsule (100 mg total) by mouth 2 (two) times daily.   doxycycline 100 MG tablet Commonly known as:  VIBRA-TABS Take 1 tablet (100 mg total) by mouth 2 (two) times daily for 5 days.   Fluticasone-Salmeterol 250-50 MCG/DOSE Aepb Commonly known as:  ADVAIR Inhale 1 puff into the lungs 2 (two) times daily.   GERI-TUSSIN DM 10-100 MG/5ML liquid Generic drug:  Dextromethorphan-guaiFENesin Take 15 mLs by mouth every 4 (four)  hours as needed (cough).   guaiFENesin 600 MG 12 hr tablet Commonly known as:  MUCINEX Take 2 tablets (1,200 mg total) by mouth 2 (two) times daily for 7 days.   ipratropium-albuterol 0.5-2.5 (3) MG/3ML Soln Commonly known as:  DUONEB Take 3 mLs by nebulization 3 (three) times daily.   mirtazapine 7.5 MG tablet Commonly known as:  REMERON Take 7.5 mg by mouth at bedtime.   predniSONE 10 MG tablet Commonly known as:  DELTASONE Take 3 tabs daily for 3 days, then 2 tabs daily for 3 days, then 1 tab daily for 3 days, then stop. Start taking on:  December 14, 2018      Follow-up Information    MD at [facility]. Schedule an appointment as soon as possible for a visit.   Why:  To be seen in 3 days with repeat labs (CBC & BMP).       Altamese [place], MD. Schedule an appointment as soon as possible for a visit in 1 week(s).   Specialty:  Orthopedic Surgery Contact information: [place] [address] [phone]        Howard Pouch A, DO. Schedule an appointment as soon as possible for a  visit.   Specialty:  Family Medicine Why:  Upon discharge from SNF. Contact information: [address] [place] [address] [phone]          Allergies  Allergen Reactions  . Sulfa Antibiotics Nausea Only      Procedures/Studies:   Dg Wrist Complete Left  Result Date: 12/12/2018 CLINICAL DATA:  85 year old female with a history of fall and fracture EXAM: LEFT WRIST - COMPLETE 3+ VIEW COMPARISON:  10/23/2018 FINDINGS: Casting material on the left wrist obscures bony details. Osteopenia. Fracture site of the distal radius and ulna incompletely evaluated, however, there is evidence of callus formation at the distal radius and ulna. Lateral view demonstrates persistent dorsal angulation at the distal ulnar fracture site. IMPRESSION: Casting material obscures bony details, however, there is evidence of interval callus formation and partial remodeling at the known distal  radius and ulnar fractures. Alignment is relatively unchanged compared to the prior plain film. Electronically Signed   By: Corrie Mckusick D.O.   On: 12/12/2018 09:28   Ct Angio Chest Pe W Or Wo Contrast  Result Date: 12/13/2018 CLINICAL DATA:  PE suspected, intermediate prob, positive D-dimer. Shortness of breath. EXAM: CT ANGIOGRAPHY CHEST WITH CONTRAST TECHNIQUE: Multidetector CT imaging of the chest was performed using the standard protocol during bolus administration of intravenous contrast. Multiplanar CT image reconstructions and MIPs were obtained to evaluate the vascular anatomy. CONTRAST:  21mL ISOVUE-370 IOPAMIDOL (ISOVUE-370) INJECTION 76% COMPARISON:  Radiographs 12/10/2017. Chest CT 06/04/2018 FINDINGS: Cardiovascular: There are no filling defects within the pulmonary arteries to suggest pulmonary embolus. Thoracic aorta is normal in caliber with atherosclerosis. Cannot assess for dissection given phase of contrast tailored to pulmonary embolus evaluation. Heart is normal in size. There are coronary artery calcifications. No pericardial effusion. Mediastinum/Nodes: Small mediastinal lymph nodes, largest in the subcarinal station measuring 14 mm. No hilar adenopathy. No visualized thyroid nodule. Lungs/Pleura: Emphysema with chronic hyperinflation. Small right pleural effusion which is new from prior. Associated compressive atelectasis. Trace left pleural effusion. Calcified granuloma at the right lung base. There is retained mucus in the distal trachea extending into the left mainstem bronchus. Scattered areas of mucoid impaction throughout both lungs. Peribronchial nodularity in the right upper and middle lobes has improved from prior exam, with a few residual nodular opacities. No dominant pulmonary mass or suspicious nodule. Small focal pleural-based opacity in the left lower lobe is likely atelectasis. Upper Abdomen: Excreted contrast in the bilateral renal collecting systems. Atherosclerosis of  upper abdominal vasculature. Musculoskeletal: T12 compression fracture with 50% loss of height an posterior cortex buckling, age indeterminate but new from July 2019. Prominent Schmorl's node in T5 is unchanged. Review of the MIP images confirms the above findings. IMPRESSION: 1. No pulmonary embolus. 2. Small right and trace left pleural effusion, new from prior exam. 3. Emphysema with chronic hyperinflation. Retained mucus in the trachea and left mainstem bronchi with areas of mucous plugging throughout both lungs. 4. Peribronchial nodularity in the right upper and middle lobes has improved from prior exam, with a few residual nodular opacities suggesting post infectious/inflammatory etiology. 5. T12 compression fracture, age indeterminate but new from July 2019. Emphysema (ICD10-J43.9). Aortic Atherosclerosis (ICD10-I70.0). Electronically Signed   By: Keith Rake M.D.   On: 12/13/2018 03:21   Dg Pelvis Comp Min 3v  Result Date: 12/11/2018 CLINICAL DATA:  Follow-up pelvic fractures EXAM: JUDET PELVIS - 3+ VIEW COMPARISON:  CT from 11/27/2018 FINDINGS: Healing fractures involving the junction of the left superior previous ramus and  acetabulum as well as the inferior pubic ramus are seen similar to that noted on recent CT examination. The left iliac bone fracture is again identified and stable with mild displacement of fracture fragments identified no new fractures are seen. No gross soft tissue abnormality is noted. IMPRESSION: Stable appearing fractures involving the left iliac wing as well as the superior and inferior pubic rami on the left similar to that seen on prior CT examination. Electronically Signed   By: Inez Catalina M.D.   On: 12/11/2018 14:05   Dg Chest Portable 1 View  Result Date: 12/10/2018 CLINICAL DATA:  Shortness of breath, chest pain EXAM: PORTABLE CHEST 1 VIEW COMPARISON:  11/27/2018 FINDINGS: The lungs are hyperinflated likely secondary to COPD. There is no focal parenchymal  opacity. There is no pleural effusion or pneumothorax. The heart and mediastinal contours are unremarkable. The osseous structures are unremarkable. IMPRESSION: No active disease. Electronically Signed   By: Kathreen Devoid   On: 12/10/2018 13:39      Subjective: States that she feels "fine".  Denies complaints.  Indicates that her breathing is back to baseline.  Mild intermittent dry and nonproductive cough.  No chest pain, dizziness or lightheadedness.    Discharge Exam:  Vitals:   12/13/18 0529 12/13/18 0800 12/13/18 0804 12/13/18 0916  BP: 131/83   125/65  Pulse: 87   78  Resp: 18   19  Temp: 98 F (36.7 C)   97.6 F (36.4 C)  TempSrc: Oral   Oral  SpO2: 100% 99% 99% 98%  Weight:      Height:        General: Pleasant elderly female, small built, frail lying comfortably propped up in bed without distress.  Appears to be in good spirits. Cardiovascular: S1 & S2 heard, RRR, S1/S2 +. No murmurs, rubs, gallops or clicks. No JVD or pedal edema. Respiratory: Slightly diminished breath sounds in the bases but otherwise clear to auscultation without wheezing, rhonchi or crackles.  No increased work of breathing. Abdominal:  Non distended, non tender & soft. No organomegaly or masses appreciated. Normal bowel sounds heard. CNS: Alert and oriented. No focal deficits.  Superficial wound noted on left distal dorsal wrist without acute findings suggestive of infection.  Rest of exam as per wound care team. Extremities: no edema, no cyanosis    The results of significant diagnostics from this hospitalization (including imaging, microbiology, ancillary and laboratory) are listed below for reference.     Microbiology: Recent Results (from the past 240 hour(s))  MRSA PCR Screening     Status: None   Collection Time: 12/12/18 11:42 AM  Result Value Ref Range Status   MRSA by PCR NEGATIVE NEGATIVE Final    Comment:        The GeneXpert MRSA Assay (FDA approved for NASAL specimens only), is  one component of a comprehensive MRSA colonization surveillance program. It is not intended to diagnose MRSA infection nor to guide or monitor treatment for MRSA infections. Performed at [facility], [place] [address], [place], [place] [address]      Labs: CBC: Recent Labs  Lab 12/10/18 1400 12/11/18 0527 12/12/18 0643  WBC 15.0* 10.4 8.0  NEUTROABS 12.5*  --   --   HGB 10.5* 9.9* 10.1*  HCT 35.7* 33.2* 34.4*  MCV 98.6 96.8 97.5  PLT 451* 439* 732*   Basic Metabolic Panel: Recent Labs  Lab 12/10/18 1400 12/11/18 0527 12/12/18 0643  NA 138 138 139  K 3.9 4.6  4.4  CL 90* 94* 94*  CO2 39* 36* 38*  GLUCOSE 118* 121* 120*  BUN 6* 8 10  CREATININE 0.45 0.39* 0.44  CALCIUM 9.4 9.2 8.9   Liver Function Tests: Recent Labs  Lab 12/10/18 1400  AST 11*  ALT 8  ALKPHOS 96  BILITOT 0.9  PROT 5.8*  ALBUMIN 2.7*   CBG: Recent Labs  Lab 12/12/18 1116 12/12/18 1624 12/12/18 2206 12/13/18 0740 12/13/18 1137  GLUCAP 126* 115* 117* 106* 126*    I discussed in detail with patient's daughter, updated care and answered questions.  Time coordinating discharge: 40 minutes  SIGNED:  Vernell Leep, MD, FACP, [facility] Pager [phone]  If 7PM-7AM, please contact night-coverage [email or web address] Password [facility] 12/13/2018, 3:48 PM

## 2018-12-13 NOTE — Discharge Instructions (Signed)

## 2018-12-13 NOTE — Progress Notes (Signed)
Unhealed skin tear from previous fall found under cast, WOC nurse assessed wound and advised xeroform, and kerlex. Wound cleaned and dressing changed. MD is aware of wound.

## 2018-12-13 NOTE — Progress Notes (Signed)
Attempted to call report, number left with staff member at [facility].

## 2018-12-13 NOTE — Progress Notes (Addendum)
Orthopedic Tech Progress Note Patient Details:  Sheila Mcconnell January 12, 1927 [id number]  Ortho Devices Type of Ortho Device: Velcro wrist splint Ortho Device/Splint Location: lue Ortho Device/Splint Interventions: Ordered, Application, Adjustment   Post Interventions Patient Tolerated: Well Instructions Provided: Care of device, Adjustment of device There was a dressing underneath the cast. I left it on  For the RN to remove.  Karolee Stamps 12/13/2018, 10:29 AM

## 2018-12-13 NOTE — Progress Notes (Signed)
Attempted to call reportx3, number left on supervisors voicemail

## 2018-12-13 NOTE — Progress Notes (Addendum)
Dr. Algis Liming called this NP around 1930 hrs last night to ask NP to f/up results of CTA chest to r/o PE. NP called radiology at that time because no result was on the chart. The radiologist told NP that the test hasn't been read yet, because the CT tech did not complete order for it to be read. Radiologist told NP that he would call right them and speak to the CT tech.  NP checked for results again, and still hasn't been read. NP called the radiologist again and he again told me the tech hadn't "done their part" and he would call the tech. NP stressed the importance of this because hours have passed without the test results.  KJKG, NP [facility] After NP wrote above note, NP learned what actually occurred. The CT tech, Alex, called NP and told me the test hadn't been read because it hadn't been done yet. Tech said pt had come to the CT suite x 2 tonight and had an IV infiltrate, then didn't have an IV at all the 2nd try. The pt is going down now to have the test (0225) and NP will f/up with test. CT tech stated she would call this NP should there be another issue with getting the test done. KJKG, NP [facility] Update: CTA chest is negative for PE.  KJKG, NP [facility]

## 2018-12-14 DIAGNOSIS — S32309A Unspecified fracture of unspecified ilium, initial encounter for closed fracture: Secondary | ICD-10-CM | POA: Diagnosis not present

## 2018-12-14 DIAGNOSIS — J438 Other emphysema: Secondary | ICD-10-CM | POA: Diagnosis not present

## 2018-12-14 DIAGNOSIS — I10 Essential (primary) hypertension: Secondary | ICD-10-CM | POA: Diagnosis not present

## 2018-12-14 DIAGNOSIS — J9620 Acute and chronic respiratory failure, unspecified whether with hypoxia or hypercapnia: Secondary | ICD-10-CM | POA: Diagnosis not present

## 2019-01-02 DIAGNOSIS — S32309A Unspecified fracture of unspecified ilium, initial encounter for closed fracture: Secondary | ICD-10-CM | POA: Diagnosis not present

## 2019-01-02 DIAGNOSIS — I10 Essential (primary) hypertension: Secondary | ICD-10-CM | POA: Diagnosis not present

## 2019-01-02 DIAGNOSIS — E440 Moderate protein-calorie malnutrition: Secondary | ICD-10-CM | POA: Diagnosis not present

## 2019-01-02 DIAGNOSIS — J9620 Acute and chronic respiratory failure, unspecified whether with hypoxia or hypercapnia: Secondary | ICD-10-CM | POA: Diagnosis not present

## 2019-01-15 DIAGNOSIS — S32309A Unspecified fracture of unspecified ilium, initial encounter for closed fracture: Secondary | ICD-10-CM | POA: Diagnosis not present

## 2019-01-15 DIAGNOSIS — S32810A Multiple fractures of pelvis with stable disruption of pelvic ring, initial encounter for closed fracture: Secondary | ICD-10-CM | POA: Diagnosis not present

## 2019-01-15 DIAGNOSIS — I10 Essential (primary) hypertension: Secondary | ICD-10-CM | POA: Diagnosis not present

## 2019-01-15 DIAGNOSIS — F3489 Other specified persistent mood disorders: Secondary | ICD-10-CM | POA: Diagnosis not present

## 2019-01-18 DIAGNOSIS — I10 Essential (primary) hypertension: Secondary | ICD-10-CM | POA: Diagnosis not present

## 2019-01-18 DIAGNOSIS — F39 Unspecified mood [affective] disorder: Secondary | ICD-10-CM | POA: Diagnosis not present

## 2019-01-18 DIAGNOSIS — J9620 Acute and chronic respiratory failure, unspecified whether with hypoxia or hypercapnia: Secondary | ICD-10-CM | POA: Diagnosis not present

## 2019-01-18 DIAGNOSIS — R918 Other nonspecific abnormal finding of lung field: Secondary | ICD-10-CM | POA: Diagnosis not present

## 2019-01-18 DIAGNOSIS — S329XXA Fracture of unspecified parts of lumbosacral spine and pelvis, initial encounter for closed fracture: Secondary | ICD-10-CM | POA: Diagnosis not present

## 2019-01-23 DIAGNOSIS — M6281 Muscle weakness (generalized): Secondary | ICD-10-CM | POA: Diagnosis not present

## 2019-01-23 DIAGNOSIS — R2689 Other abnormalities of gait and mobility: Secondary | ICD-10-CM | POA: Diagnosis not present

## 2019-01-24 DIAGNOSIS — R2689 Other abnormalities of gait and mobility: Secondary | ICD-10-CM | POA: Diagnosis not present

## 2019-01-24 DIAGNOSIS — M6281 Muscle weakness (generalized): Secondary | ICD-10-CM | POA: Diagnosis not present

## 2019-01-27 DIAGNOSIS — R2689 Other abnormalities of gait and mobility: Secondary | ICD-10-CM | POA: Diagnosis not present

## 2019-01-27 DIAGNOSIS — M6281 Muscle weakness (generalized): Secondary | ICD-10-CM | POA: Diagnosis not present

## 2019-01-28 DIAGNOSIS — R2689 Other abnormalities of gait and mobility: Secondary | ICD-10-CM | POA: Diagnosis not present

## 2019-01-28 DIAGNOSIS — M6281 Muscle weakness (generalized): Secondary | ICD-10-CM | POA: Diagnosis not present

## 2019-01-29 DIAGNOSIS — R2689 Other abnormalities of gait and mobility: Secondary | ICD-10-CM | POA: Diagnosis not present

## 2019-01-29 DIAGNOSIS — M6281 Muscle weakness (generalized): Secondary | ICD-10-CM | POA: Diagnosis not present

## 2019-01-30 DIAGNOSIS — R2689 Other abnormalities of gait and mobility: Secondary | ICD-10-CM | POA: Diagnosis not present

## 2019-01-30 DIAGNOSIS — M6281 Muscle weakness (generalized): Secondary | ICD-10-CM | POA: Diagnosis not present

## 2019-01-31 DIAGNOSIS — R2689 Other abnormalities of gait and mobility: Secondary | ICD-10-CM | POA: Diagnosis not present

## 2019-01-31 DIAGNOSIS — M6281 Muscle weakness (generalized): Secondary | ICD-10-CM | POA: Diagnosis not present

## 2019-02-01 DIAGNOSIS — R2689 Other abnormalities of gait and mobility: Secondary | ICD-10-CM | POA: Diagnosis not present

## 2019-02-01 DIAGNOSIS — M6281 Muscle weakness (generalized): Secondary | ICD-10-CM | POA: Diagnosis not present

## 2019-02-02 DIAGNOSIS — R2689 Other abnormalities of gait and mobility: Secondary | ICD-10-CM | POA: Diagnosis not present

## 2019-02-02 DIAGNOSIS — M6281 Muscle weakness (generalized): Secondary | ICD-10-CM | POA: Diagnosis not present

## 2019-02-04 DIAGNOSIS — R2689 Other abnormalities of gait and mobility: Secondary | ICD-10-CM | POA: Diagnosis not present

## 2019-02-04 DIAGNOSIS — M6281 Muscle weakness (generalized): Secondary | ICD-10-CM | POA: Diagnosis not present

## 2019-02-05 DIAGNOSIS — M6281 Muscle weakness (generalized): Secondary | ICD-10-CM | POA: Diagnosis not present

## 2019-02-05 DIAGNOSIS — R2689 Other abnormalities of gait and mobility: Secondary | ICD-10-CM | POA: Diagnosis not present

## 2019-02-06 DIAGNOSIS — R2689 Other abnormalities of gait and mobility: Secondary | ICD-10-CM | POA: Diagnosis not present

## 2019-02-06 DIAGNOSIS — M6281 Muscle weakness (generalized): Secondary | ICD-10-CM | POA: Diagnosis not present

## 2019-02-07 DIAGNOSIS — M6281 Muscle weakness (generalized): Secondary | ICD-10-CM | POA: Diagnosis not present

## 2019-02-07 DIAGNOSIS — R2689 Other abnormalities of gait and mobility: Secondary | ICD-10-CM | POA: Diagnosis not present

## 2019-02-08 DIAGNOSIS — R2689 Other abnormalities of gait and mobility: Secondary | ICD-10-CM | POA: Diagnosis not present

## 2019-02-08 DIAGNOSIS — M6281 Muscle weakness (generalized): Secondary | ICD-10-CM | POA: Diagnosis not present

## 2019-02-09 DIAGNOSIS — R2689 Other abnormalities of gait and mobility: Secondary | ICD-10-CM | POA: Diagnosis not present

## 2019-02-09 DIAGNOSIS — M6281 Muscle weakness (generalized): Secondary | ICD-10-CM | POA: Diagnosis not present

## 2019-02-10 DIAGNOSIS — M6281 Muscle weakness (generalized): Secondary | ICD-10-CM | POA: Diagnosis not present

## 2019-02-10 DIAGNOSIS — R2689 Other abnormalities of gait and mobility: Secondary | ICD-10-CM | POA: Diagnosis not present

## 2019-02-11 DIAGNOSIS — M6281 Muscle weakness (generalized): Secondary | ICD-10-CM | POA: Diagnosis not present

## 2019-02-11 DIAGNOSIS — R2689 Other abnormalities of gait and mobility: Secondary | ICD-10-CM | POA: Diagnosis not present

## 2019-02-12 DIAGNOSIS — R2689 Other abnormalities of gait and mobility: Secondary | ICD-10-CM | POA: Diagnosis not present

## 2019-02-12 DIAGNOSIS — M6281 Muscle weakness (generalized): Secondary | ICD-10-CM | POA: Diagnosis not present

## 2019-02-13 DIAGNOSIS — R2689 Other abnormalities of gait and mobility: Secondary | ICD-10-CM | POA: Diagnosis not present

## 2019-02-13 DIAGNOSIS — M6281 Muscle weakness (generalized): Secondary | ICD-10-CM | POA: Diagnosis not present

## 2019-02-14 DIAGNOSIS — R2689 Other abnormalities of gait and mobility: Secondary | ICD-10-CM | POA: Diagnosis not present

## 2019-02-14 DIAGNOSIS — M6281 Muscle weakness (generalized): Secondary | ICD-10-CM | POA: Diagnosis not present

## 2019-02-15 DIAGNOSIS — R2689 Other abnormalities of gait and mobility: Secondary | ICD-10-CM | POA: Diagnosis not present

## 2019-02-15 DIAGNOSIS — M6281 Muscle weakness (generalized): Secondary | ICD-10-CM | POA: Diagnosis not present

## 2019-02-18 DIAGNOSIS — R2689 Other abnormalities of gait and mobility: Secondary | ICD-10-CM | POA: Diagnosis not present

## 2019-02-18 DIAGNOSIS — M6281 Muscle weakness (generalized): Secondary | ICD-10-CM | POA: Diagnosis not present

## 2019-02-19 DIAGNOSIS — R2689 Other abnormalities of gait and mobility: Secondary | ICD-10-CM | POA: Diagnosis not present

## 2019-02-19 DIAGNOSIS — M6281 Muscle weakness (generalized): Secondary | ICD-10-CM | POA: Diagnosis not present

## 2019-02-20 DIAGNOSIS — M6281 Muscle weakness (generalized): Secondary | ICD-10-CM | POA: Diagnosis not present

## 2019-02-20 DIAGNOSIS — R2689 Other abnormalities of gait and mobility: Secondary | ICD-10-CM | POA: Diagnosis not present

## 2019-02-21 DIAGNOSIS — R2689 Other abnormalities of gait and mobility: Secondary | ICD-10-CM | POA: Diagnosis not present

## 2019-02-21 DIAGNOSIS — M6281 Muscle weakness (generalized): Secondary | ICD-10-CM | POA: Diagnosis not present

## 2019-02-22 DIAGNOSIS — R2689 Other abnormalities of gait and mobility: Secondary | ICD-10-CM | POA: Diagnosis not present

## 2019-02-22 DIAGNOSIS — M6281 Muscle weakness (generalized): Secondary | ICD-10-CM | POA: Diagnosis not present

## 2019-02-23 DIAGNOSIS — R2689 Other abnormalities of gait and mobility: Secondary | ICD-10-CM | POA: Diagnosis not present

## 2019-02-23 DIAGNOSIS — M6281 Muscle weakness (generalized): Secondary | ICD-10-CM | POA: Diagnosis not present

## 2019-02-26 DIAGNOSIS — R2689 Other abnormalities of gait and mobility: Secondary | ICD-10-CM | POA: Diagnosis not present

## 2019-02-26 DIAGNOSIS — M6281 Muscle weakness (generalized): Secondary | ICD-10-CM | POA: Diagnosis not present

## 2019-06-04 DIAGNOSIS — J432 Centrilobular emphysema: Secondary | ICD-10-CM | POA: Diagnosis not present

## 2019-06-04 DIAGNOSIS — J449 Chronic obstructive pulmonary disease, unspecified: Secondary | ICD-10-CM | POA: Diagnosis not present

## 2019-06-19 DIAGNOSIS — J9610 Chronic respiratory failure, unspecified whether with hypoxia or hypercapnia: Secondary | ICD-10-CM | POA: Diagnosis not present

## 2019-06-19 DIAGNOSIS — M25562 Pain in left knee: Secondary | ICD-10-CM | POA: Diagnosis not present

## 2019-06-19 DIAGNOSIS — J449 Chronic obstructive pulmonary disease, unspecified: Secondary | ICD-10-CM | POA: Diagnosis not present

## 2019-06-19 DIAGNOSIS — M25561 Pain in right knee: Secondary | ICD-10-CM | POA: Diagnosis not present

## 2019-06-24 DIAGNOSIS — U071 COVID-19: Secondary | ICD-10-CM | POA: Diagnosis not present

## 2019-06-25 DIAGNOSIS — R262 Difficulty in walking, not elsewhere classified: Secondary | ICD-10-CM | POA: Diagnosis not present

## 2019-06-26 DIAGNOSIS — R262 Difficulty in walking, not elsewhere classified: Secondary | ICD-10-CM | POA: Diagnosis not present

## 2019-06-27 DIAGNOSIS — J449 Chronic obstructive pulmonary disease, unspecified: Secondary | ICD-10-CM | POA: Diagnosis not present

## 2019-06-27 DIAGNOSIS — R262 Difficulty in walking, not elsewhere classified: Secondary | ICD-10-CM | POA: Diagnosis not present

## 2019-06-27 DIAGNOSIS — J432 Centrilobular emphysema: Secondary | ICD-10-CM | POA: Diagnosis not present

## 2019-06-28 DIAGNOSIS — R262 Difficulty in walking, not elsewhere classified: Secondary | ICD-10-CM | POA: Diagnosis not present

## 2019-07-01 DIAGNOSIS — R262 Difficulty in walking, not elsewhere classified: Secondary | ICD-10-CM | POA: Diagnosis not present

## 2019-07-02 DIAGNOSIS — R262 Difficulty in walking, not elsewhere classified: Secondary | ICD-10-CM | POA: Diagnosis not present

## 2019-07-03 DIAGNOSIS — R262 Difficulty in walking, not elsewhere classified: Secondary | ICD-10-CM | POA: Diagnosis not present

## 2019-07-04 DIAGNOSIS — R262 Difficulty in walking, not elsewhere classified: Secondary | ICD-10-CM | POA: Diagnosis not present

## 2019-07-05 DIAGNOSIS — R262 Difficulty in walking, not elsewhere classified: Secondary | ICD-10-CM | POA: Diagnosis not present

## 2019-07-06 DIAGNOSIS — R262 Difficulty in walking, not elsewhere classified: Secondary | ICD-10-CM | POA: Diagnosis not present

## 2019-07-08 ENCOUNTER — Other Ambulatory Visit: Payer: Self-pay

## 2019-07-09 DIAGNOSIS — R262 Difficulty in walking, not elsewhere classified: Secondary | ICD-10-CM | POA: Diagnosis not present

## 2019-07-10 DIAGNOSIS — R262 Difficulty in walking, not elsewhere classified: Secondary | ICD-10-CM | POA: Diagnosis not present

## 2019-07-11 DIAGNOSIS — R262 Difficulty in walking, not elsewhere classified: Secondary | ICD-10-CM | POA: Diagnosis not present

## 2019-07-12 DIAGNOSIS — R262 Difficulty in walking, not elsewhere classified: Secondary | ICD-10-CM | POA: Diagnosis not present

## 2019-07-13 DIAGNOSIS — R262 Difficulty in walking, not elsewhere classified: Secondary | ICD-10-CM | POA: Diagnosis not present

## 2019-07-18 DIAGNOSIS — M25562 Pain in left knee: Secondary | ICD-10-CM | POA: Diagnosis not present

## 2019-07-18 DIAGNOSIS — M25561 Pain in right knee: Secondary | ICD-10-CM | POA: Diagnosis not present

## 2019-07-18 DIAGNOSIS — J9610 Chronic respiratory failure, unspecified whether with hypoxia or hypercapnia: Secondary | ICD-10-CM | POA: Diagnosis not present

## 2019-07-18 DIAGNOSIS — J449 Chronic obstructive pulmonary disease, unspecified: Secondary | ICD-10-CM | POA: Diagnosis not present

## 2019-07-20 DIAGNOSIS — R262 Difficulty in walking, not elsewhere classified: Secondary | ICD-10-CM | POA: Diagnosis not present

## 2019-07-22 DIAGNOSIS — R262 Difficulty in walking, not elsewhere classified: Secondary | ICD-10-CM | POA: Diagnosis not present

## 2019-07-23 DIAGNOSIS — R262 Difficulty in walking, not elsewhere classified: Secondary | ICD-10-CM | POA: Diagnosis not present

## 2019-07-24 DIAGNOSIS — R262 Difficulty in walking, not elsewhere classified: Secondary | ICD-10-CM | POA: Diagnosis not present

## 2019-07-25 DIAGNOSIS — J449 Chronic obstructive pulmonary disease, unspecified: Secondary | ICD-10-CM | POA: Diagnosis not present

## 2019-07-25 DIAGNOSIS — J432 Centrilobular emphysema: Secondary | ICD-10-CM | POA: Diagnosis not present

## 2019-08-21 DIAGNOSIS — M25561 Pain in right knee: Secondary | ICD-10-CM | POA: Diagnosis not present

## 2019-08-21 DIAGNOSIS — J9610 Chronic respiratory failure, unspecified whether with hypoxia or hypercapnia: Secondary | ICD-10-CM | POA: Diagnosis not present

## 2019-08-21 DIAGNOSIS — J449 Chronic obstructive pulmonary disease, unspecified: Secondary | ICD-10-CM | POA: Diagnosis not present

## 2019-08-21 DIAGNOSIS — M25562 Pain in left knee: Secondary | ICD-10-CM | POA: Diagnosis not present

## 2019-08-26 DIAGNOSIS — U071 COVID-19: Secondary | ICD-10-CM | POA: Diagnosis not present

## 2019-09-04 DIAGNOSIS — U071 COVID-19: Secondary | ICD-10-CM | POA: Diagnosis not present

## 2019-09-16 DIAGNOSIS — Z23 Encounter for immunization: Secondary | ICD-10-CM | POA: Diagnosis not present

## 2019-09-25 DIAGNOSIS — U071 COVID-19: Secondary | ICD-10-CM | POA: Diagnosis not present

## 2019-09-26 DIAGNOSIS — M25562 Pain in left knee: Secondary | ICD-10-CM | POA: Diagnosis not present

## 2019-09-26 DIAGNOSIS — M25561 Pain in right knee: Secondary | ICD-10-CM | POA: Diagnosis not present

## 2019-09-26 DIAGNOSIS — J432 Centrilobular emphysema: Secondary | ICD-10-CM | POA: Diagnosis not present

## 2019-09-26 DIAGNOSIS — J449 Chronic obstructive pulmonary disease, unspecified: Secondary | ICD-10-CM | POA: Diagnosis not present

## 2019-09-30 DIAGNOSIS — J432 Centrilobular emphysema: Secondary | ICD-10-CM | POA: Diagnosis not present

## 2019-09-30 DIAGNOSIS — J449 Chronic obstructive pulmonary disease, unspecified: Secondary | ICD-10-CM | POA: Diagnosis not present

## 2019-10-08 DIAGNOSIS — U071 COVID-19: Secondary | ICD-10-CM | POA: Diagnosis not present

## 2019-10-11 DIAGNOSIS — U071 COVID-19: Secondary | ICD-10-CM | POA: Diagnosis not present

## 2019-10-16 DIAGNOSIS — U071 COVID-19: Secondary | ICD-10-CM | POA: Diagnosis not present

## 2019-10-23 DIAGNOSIS — U071 COVID-19: Secondary | ICD-10-CM | POA: Diagnosis not present

## 2019-10-29 DIAGNOSIS — U071 COVID-19: Secondary | ICD-10-CM | POA: Diagnosis not present

## 2019-11-04 DIAGNOSIS — J432 Centrilobular emphysema: Secondary | ICD-10-CM | POA: Diagnosis not present

## 2019-11-04 DIAGNOSIS — J449 Chronic obstructive pulmonary disease, unspecified: Secondary | ICD-10-CM | POA: Diagnosis not present

## 2019-11-06 DIAGNOSIS — U071 COVID-19: Secondary | ICD-10-CM | POA: Diagnosis not present

## 2019-11-06 DIAGNOSIS — M25561 Pain in right knee: Secondary | ICD-10-CM | POA: Diagnosis not present

## 2019-11-06 DIAGNOSIS — J432 Centrilobular emphysema: Secondary | ICD-10-CM | POA: Diagnosis not present

## 2019-11-06 DIAGNOSIS — J9610 Chronic respiratory failure, unspecified whether with hypoxia or hypercapnia: Secondary | ICD-10-CM | POA: Diagnosis not present

## 2019-11-08 DIAGNOSIS — Z9981 Dependence on supplemental oxygen: Secondary | ICD-10-CM | POA: Diagnosis not present

## 2019-11-08 DIAGNOSIS — R609 Edema, unspecified: Secondary | ICD-10-CM | POA: Diagnosis not present

## 2019-11-08 DIAGNOSIS — J449 Chronic obstructive pulmonary disease, unspecified: Secondary | ICD-10-CM | POA: Diagnosis not present

## 2019-11-08 DIAGNOSIS — Z87891 Personal history of nicotine dependence: Secondary | ICD-10-CM | POA: Diagnosis not present

## 2019-11-08 DIAGNOSIS — H269 Unspecified cataract: Secondary | ICD-10-CM | POA: Diagnosis not present

## 2019-11-08 DIAGNOSIS — I700 Atherosclerosis of aorta: Secondary | ICD-10-CM | POA: Diagnosis not present

## 2019-11-08 DIAGNOSIS — Z23 Encounter for immunization: Secondary | ICD-10-CM | POA: Diagnosis not present

## 2019-11-08 DIAGNOSIS — I119 Hypertensive heart disease without heart failure: Secondary | ICD-10-CM | POA: Diagnosis not present

## 2019-11-08 DIAGNOSIS — F339 Major depressive disorder, recurrent, unspecified: Secondary | ICD-10-CM | POA: Diagnosis not present

## 2019-11-08 DIAGNOSIS — F419 Anxiety disorder, unspecified: Secondary | ICD-10-CM | POA: Diagnosis not present

## 2019-11-08 DIAGNOSIS — U071 COVID-19: Secondary | ICD-10-CM | POA: Diagnosis not present

## 2019-11-08 DIAGNOSIS — Z9181 History of falling: Secondary | ICD-10-CM | POA: Diagnosis not present

## 2019-11-08 DIAGNOSIS — J432 Centrilobular emphysema: Secondary | ICD-10-CM | POA: Diagnosis not present

## 2019-11-08 DIAGNOSIS — M6281 Muscle weakness (generalized): Secondary | ICD-10-CM | POA: Diagnosis not present

## 2019-11-08 DIAGNOSIS — M25561 Pain in right knee: Secondary | ICD-10-CM | POA: Diagnosis not present

## 2019-11-08 DIAGNOSIS — E440 Moderate protein-calorie malnutrition: Secondary | ICD-10-CM | POA: Diagnosis not present

## 2019-11-08 DIAGNOSIS — E785 Hyperlipidemia, unspecified: Secondary | ICD-10-CM | POA: Diagnosis not present

## 2019-11-08 DIAGNOSIS — R0609 Other forms of dyspnea: Secondary | ICD-10-CM | POA: Diagnosis not present

## 2019-11-08 DIAGNOSIS — J9612 Chronic respiratory failure with hypercapnia: Secondary | ICD-10-CM | POA: Diagnosis not present

## 2019-11-08 DIAGNOSIS — M810 Age-related osteoporosis without current pathological fracture: Secondary | ICD-10-CM | POA: Diagnosis not present

## 2019-11-08 DIAGNOSIS — J9610 Chronic respiratory failure, unspecified whether with hypoxia or hypercapnia: Secondary | ICD-10-CM | POA: Diagnosis not present

## 2019-11-13 IMAGING — DX DG CHEST 2V
3 series · 3 of 3 positions shown · non-contrast
Comparison: CT chest June 04, 2018 and chest radiograph June 04, 2018

CLINICAL DATA: Worsening shortness of breath. Similar symptoms 1
month ago. Oxygen dependent, history of emphysema.

EXAM:
CHEST - 2 VIEW

[chest lat (1 of 2)]
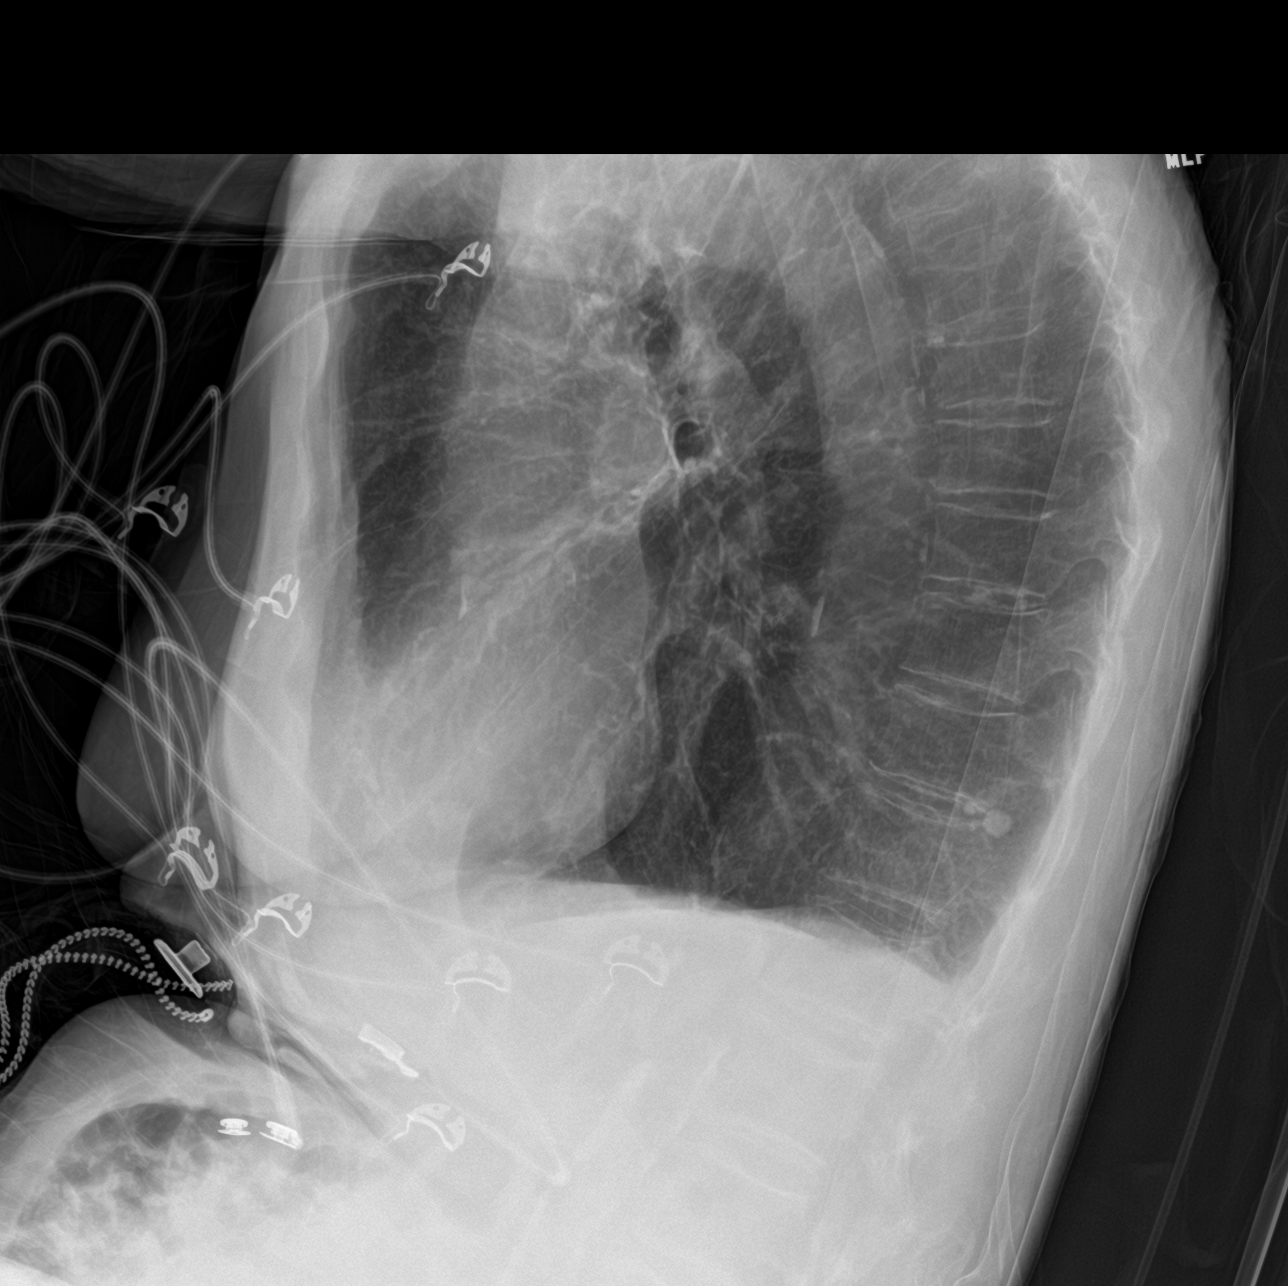

[chest ap]
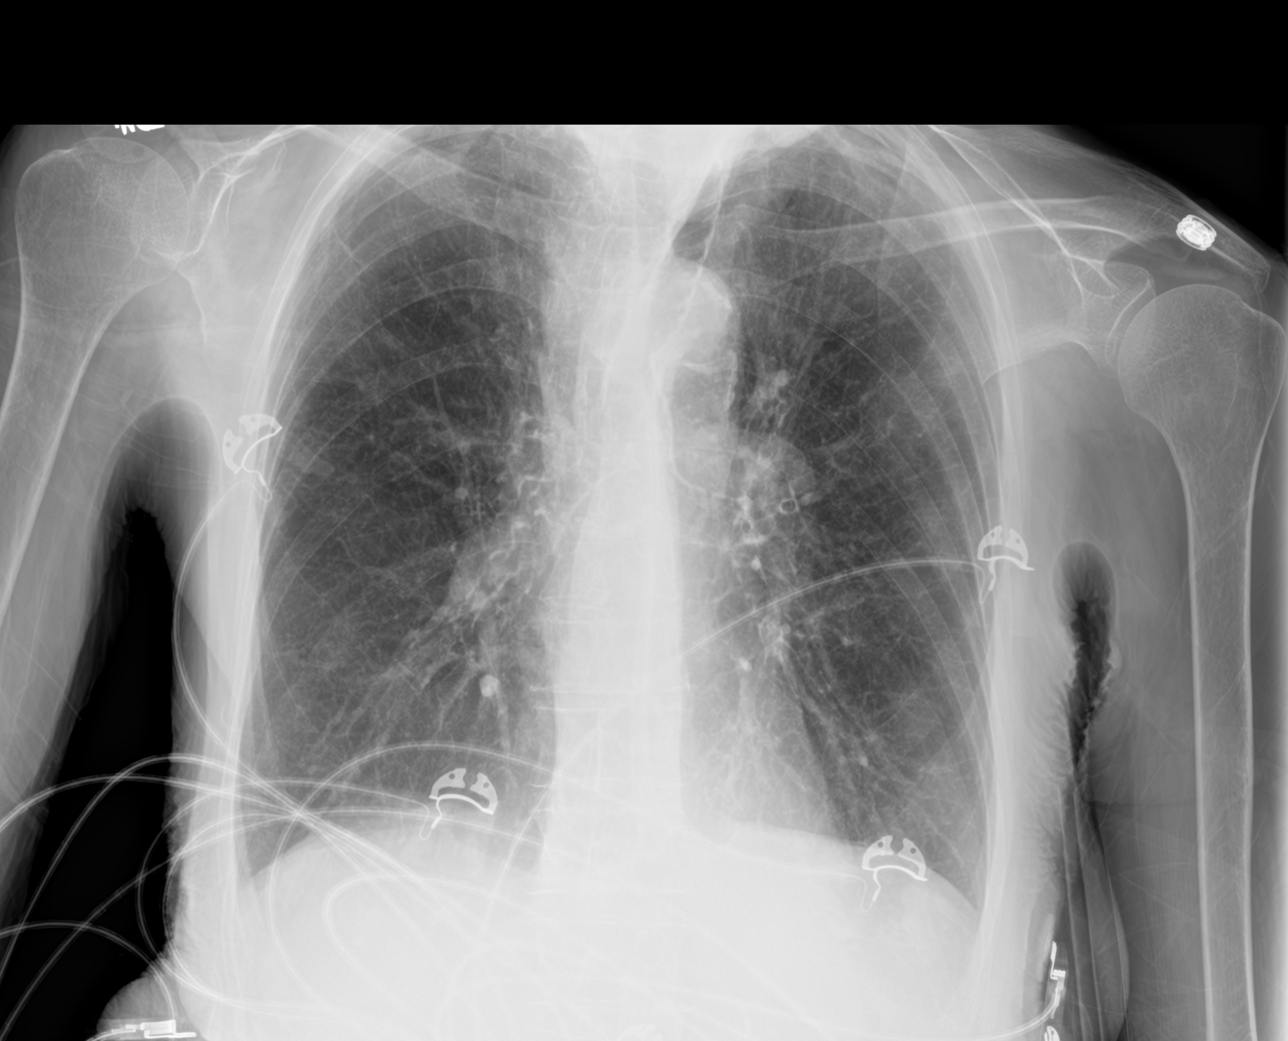

[chest lat (2 of 2)]
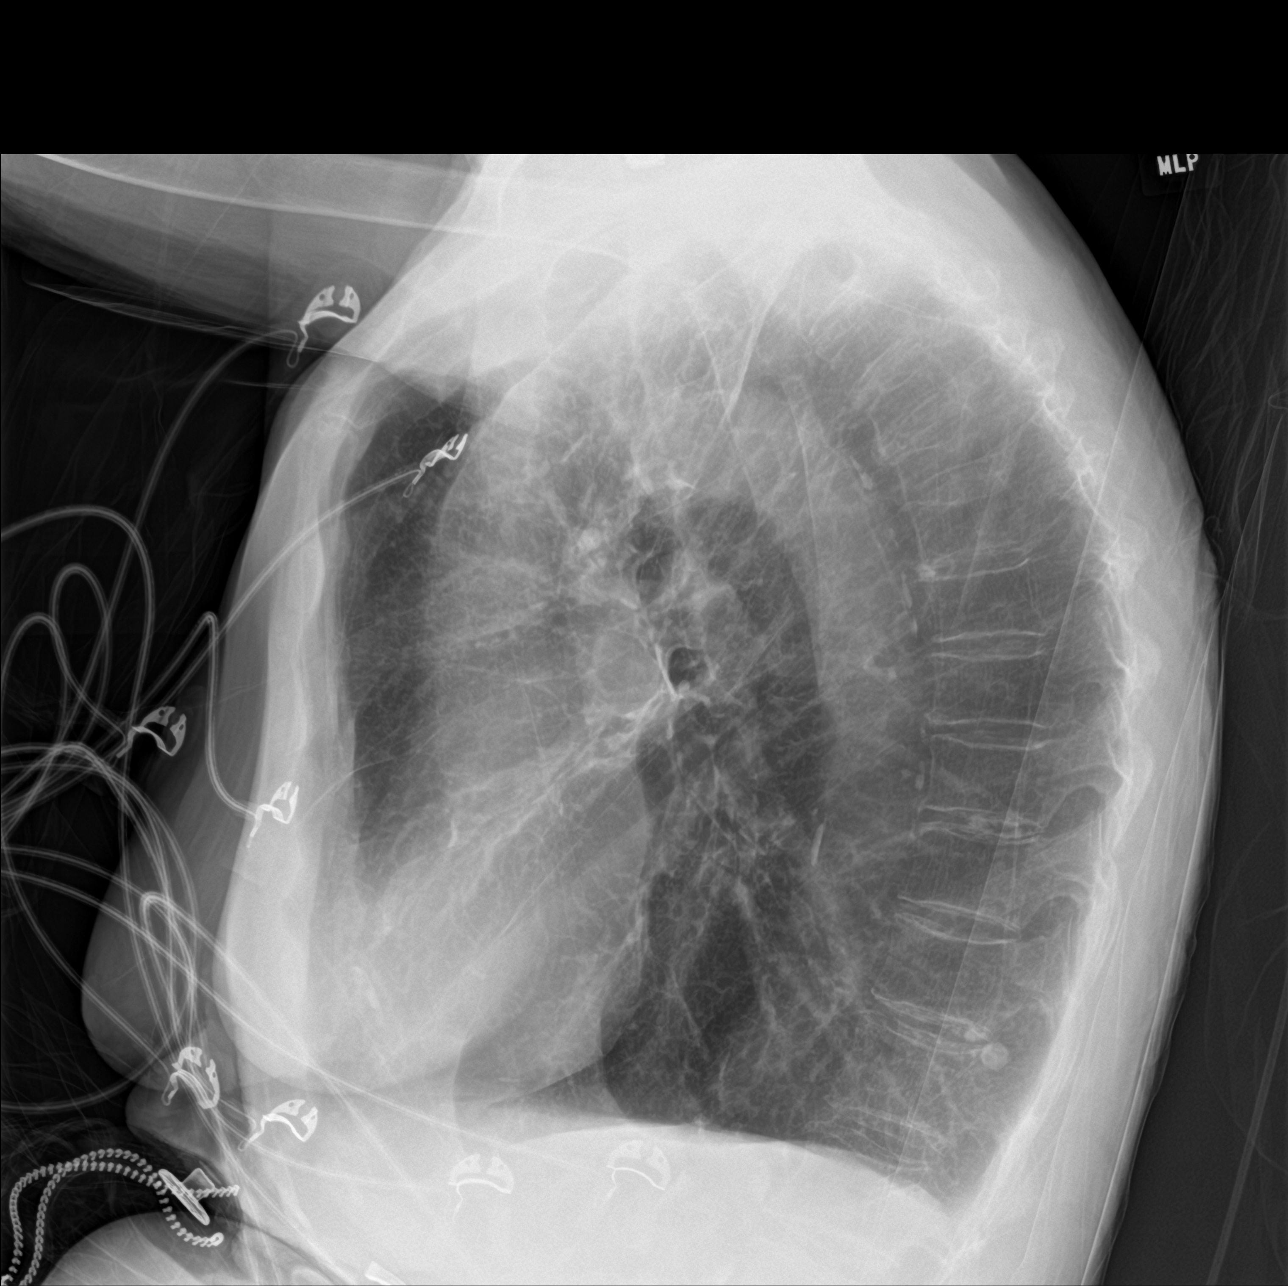

[3 of 3 positions shown; findings below may reference images not displayed]

FINDINGS: Cardiomediastinal silhouette is normal. Calcified aortic knob.
Chronic interstitial changes increased lung volumes with flattened
hemidiaphragms. Scattered granulomas. No pleural effusion or focal
consolidation. No pneumothorax. Osteopenia. Mild old upper thoracic
compression fracture.
IMPRESSION: COPD.  No focal consolidation.

Aortic Atherosclerosis ([id number]).

## 2019-11-16 IMAGING — DX DG CHEST 2V
3 series · 3 of 3 positions shown · non-contrast
Comparison: 07/01/2018

CLINICAL DATA: Shortness of breath

EXAM:
CHEST - 2 VIEW

[chest ap]
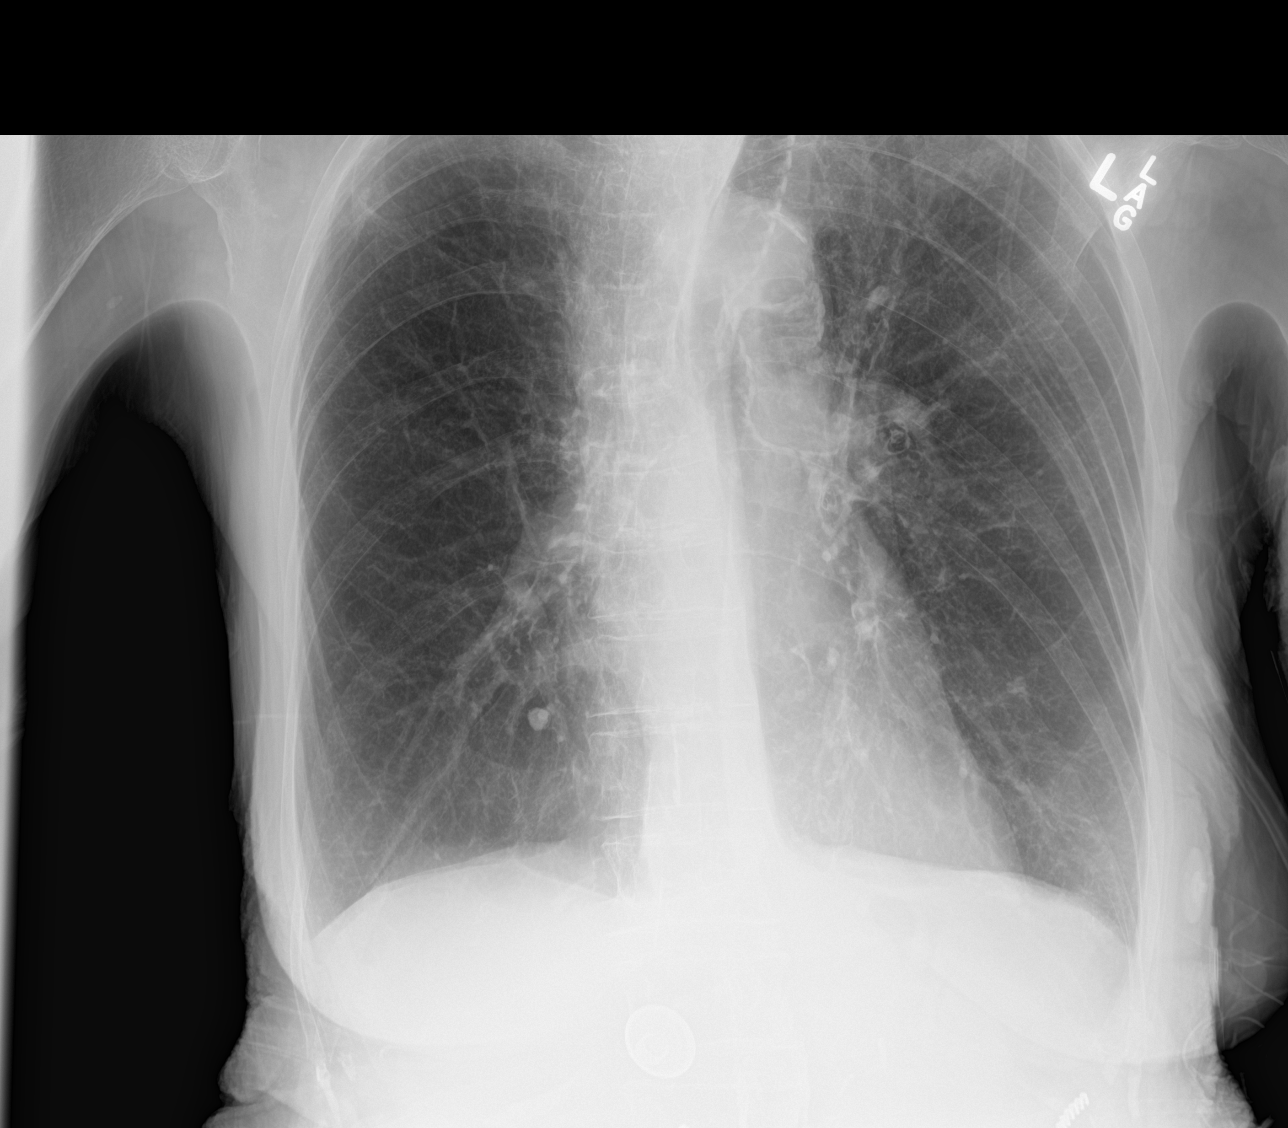

[chest lat (1 of 2)]
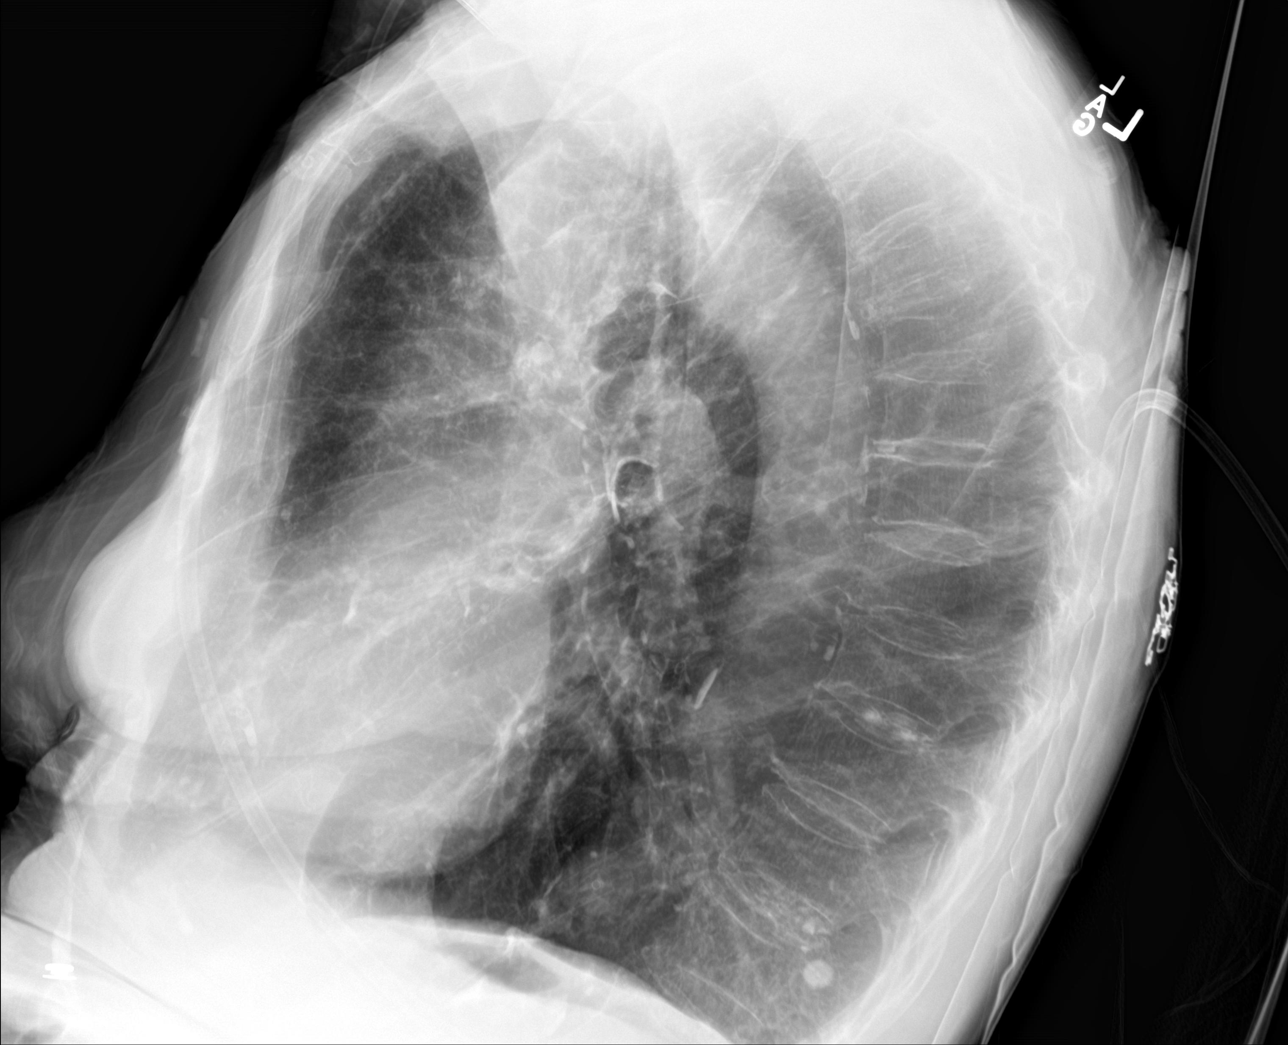

[chest lat (2 of 2)]
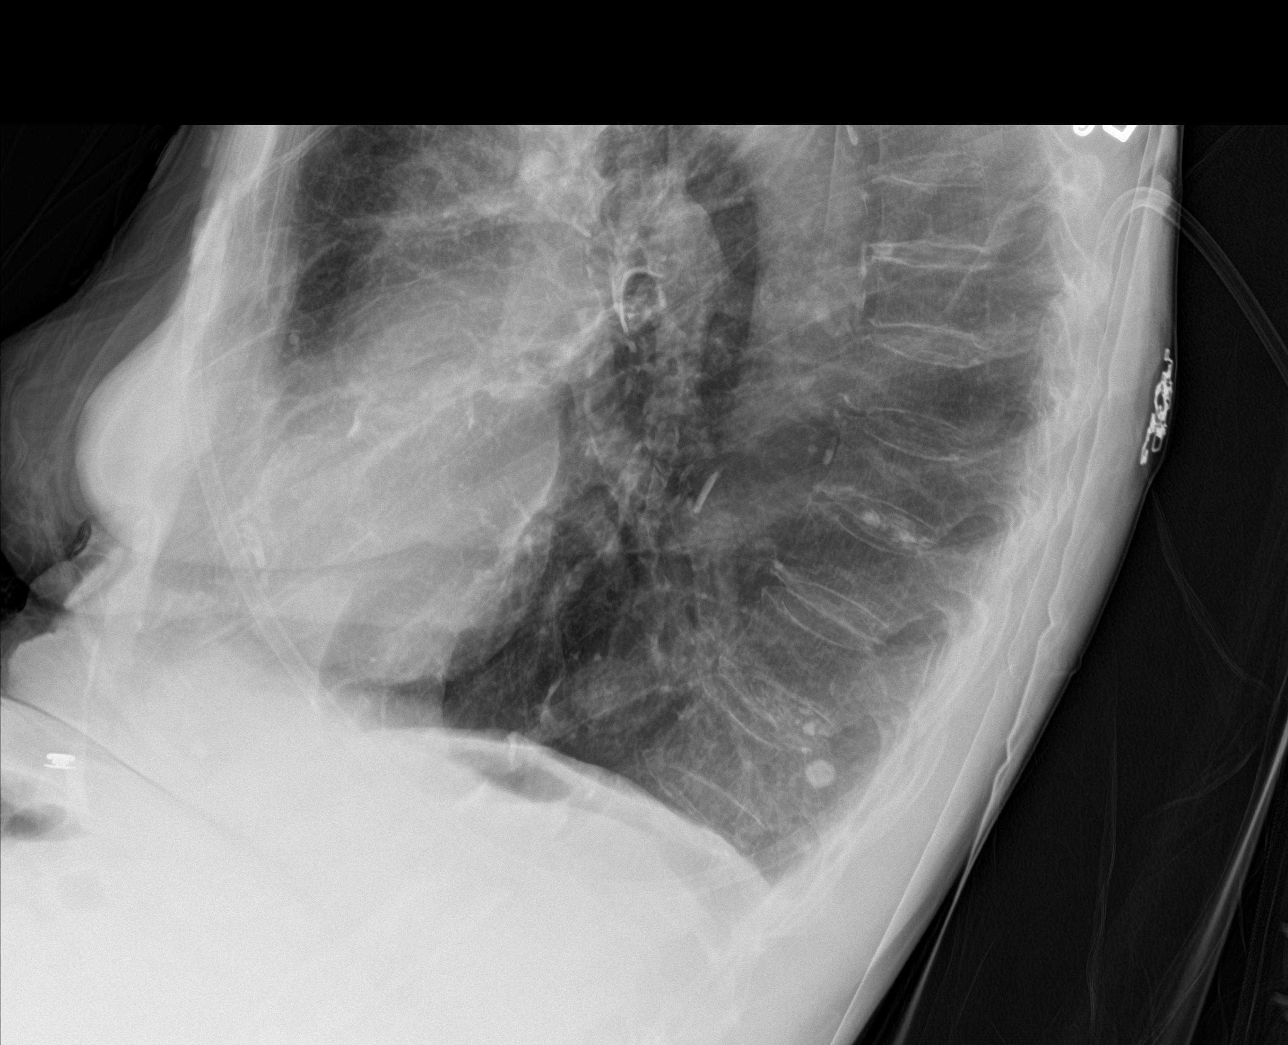

[3 of 3 positions shown; findings below may reference images not displayed]

FINDINGS: Lungs are clear.  No pleural effusion or pneumothorax.

Right upper lobe scarring.  Emphysematous changes.

The heart is normal in size.

Mild degenerative changes of the visualized thoracolumbar spine.
IMPRESSION: No evidence of acute cardiopulmonary disease.

## 2019-11-28 DIAGNOSIS — J432 Centrilobular emphysema: Secondary | ICD-10-CM | POA: Diagnosis not present

## 2019-11-28 DIAGNOSIS — M25561 Pain in right knee: Secondary | ICD-10-CM | POA: Diagnosis not present

## 2019-11-28 DIAGNOSIS — J9610 Chronic respiratory failure, unspecified whether with hypoxia or hypercapnia: Secondary | ICD-10-CM | POA: Diagnosis not present

## 2019-11-28 DIAGNOSIS — U071 COVID-19: Secondary | ICD-10-CM | POA: Diagnosis not present

## 2019-12-09 DIAGNOSIS — J432 Centrilobular emphysema: Secondary | ICD-10-CM | POA: Diagnosis not present

## 2019-12-09 DIAGNOSIS — M25561 Pain in right knee: Secondary | ICD-10-CM | POA: Diagnosis not present

## 2019-12-09 DIAGNOSIS — J9610 Chronic respiratory failure, unspecified whether with hypoxia or hypercapnia: Secondary | ICD-10-CM | POA: Diagnosis not present

## 2019-12-09 DIAGNOSIS — U071 COVID-19: Secondary | ICD-10-CM | POA: Diagnosis not present

## 2019-12-12 DIAGNOSIS — Z23 Encounter for immunization: Secondary | ICD-10-CM | POA: Diagnosis not present

## 2020-01-02 DIAGNOSIS — J449 Chronic obstructive pulmonary disease, unspecified: Secondary | ICD-10-CM | POA: Diagnosis not present

## 2020-01-02 DIAGNOSIS — J432 Centrilobular emphysema: Secondary | ICD-10-CM | POA: Diagnosis not present

## 2020-01-09 DIAGNOSIS — Z23 Encounter for immunization: Secondary | ICD-10-CM | POA: Diagnosis not present

## 2020-01-23 DIAGNOSIS — J9610 Chronic respiratory failure, unspecified whether with hypoxia or hypercapnia: Secondary | ICD-10-CM | POA: Diagnosis not present

## 2020-01-23 DIAGNOSIS — K219 Gastro-esophageal reflux disease without esophagitis: Secondary | ICD-10-CM | POA: Diagnosis not present

## 2020-01-23 DIAGNOSIS — J449 Chronic obstructive pulmonary disease, unspecified: Secondary | ICD-10-CM | POA: Diagnosis not present

## 2020-01-23 DIAGNOSIS — E46 Unspecified protein-calorie malnutrition: Secondary | ICD-10-CM | POA: Diagnosis not present

## 2020-02-05 DIAGNOSIS — K5900 Constipation, unspecified: Secondary | ICD-10-CM | POA: Diagnosis not present

## 2020-02-05 DIAGNOSIS — M25561 Pain in right knee: Secondary | ICD-10-CM | POA: Diagnosis not present

## 2020-02-05 DIAGNOSIS — M25562 Pain in left knee: Secondary | ICD-10-CM | POA: Diagnosis not present

## 2020-02-05 DIAGNOSIS — J449 Chronic obstructive pulmonary disease, unspecified: Secondary | ICD-10-CM | POA: Diagnosis not present

## 2020-04-02 DIAGNOSIS — J432 Centrilobular emphysema: Secondary | ICD-10-CM | POA: Diagnosis not present

## 2020-04-02 DIAGNOSIS — G4700 Insomnia, unspecified: Secondary | ICD-10-CM | POA: Diagnosis not present

## 2020-04-02 DIAGNOSIS — M25562 Pain in left knee: Secondary | ICD-10-CM | POA: Diagnosis not present

## 2020-04-02 DIAGNOSIS — R627 Adult failure to thrive: Secondary | ICD-10-CM | POA: Diagnosis not present

## 2020-04-08 DIAGNOSIS — J449 Chronic obstructive pulmonary disease, unspecified: Secondary | ICD-10-CM | POA: Diagnosis not present

## 2020-04-08 DIAGNOSIS — J432 Centrilobular emphysema: Secondary | ICD-10-CM | POA: Diagnosis not present

## 2020-04-08 DIAGNOSIS — J9610 Chronic respiratory failure, unspecified whether with hypoxia or hypercapnia: Secondary | ICD-10-CM | POA: Diagnosis not present

## 2020-04-08 DIAGNOSIS — M25561 Pain in right knee: Secondary | ICD-10-CM | POA: Diagnosis not present

## 2020-06-15 DIAGNOSIS — J432 Centrilobular emphysema: Secondary | ICD-10-CM | POA: Diagnosis not present

## 2020-06-15 DIAGNOSIS — J449 Chronic obstructive pulmonary disease, unspecified: Secondary | ICD-10-CM | POA: Diagnosis not present

## 2020-06-15 DIAGNOSIS — J9610 Chronic respiratory failure, unspecified whether with hypoxia or hypercapnia: Secondary | ICD-10-CM | POA: Diagnosis not present

## 2020-06-15 DIAGNOSIS — M25561 Pain in right knee: Secondary | ICD-10-CM | POA: Diagnosis not present

## 2020-07-08 DIAGNOSIS — B351 Tinea unguium: Secondary | ICD-10-CM | POA: Diagnosis not present

## 2020-07-15 DIAGNOSIS — J449 Chronic obstructive pulmonary disease, unspecified: Secondary | ICD-10-CM | POA: Diagnosis not present

## 2020-07-15 DIAGNOSIS — G4700 Insomnia, unspecified: Secondary | ICD-10-CM | POA: Diagnosis not present

## 2020-07-15 DIAGNOSIS — J432 Centrilobular emphysema: Secondary | ICD-10-CM | POA: Diagnosis not present

## 2020-07-15 DIAGNOSIS — J9610 Chronic respiratory failure, unspecified whether with hypoxia or hypercapnia: Secondary | ICD-10-CM | POA: Diagnosis not present

## 2020-07-15 DIAGNOSIS — E46 Unspecified protein-calorie malnutrition: Secondary | ICD-10-CM | POA: Diagnosis not present

## 2020-07-29 DIAGNOSIS — R627 Adult failure to thrive: Secondary | ICD-10-CM | POA: Diagnosis not present

## 2020-07-29 DIAGNOSIS — R109 Unspecified abdominal pain: Secondary | ICD-10-CM | POA: Diagnosis not present

## 2020-07-29 DIAGNOSIS — R1030 Lower abdominal pain, unspecified: Secondary | ICD-10-CM | POA: Diagnosis not present

## 2020-07-29 DIAGNOSIS — G4700 Insomnia, unspecified: Secondary | ICD-10-CM | POA: Diagnosis not present

## 2020-07-29 DIAGNOSIS — R630 Anorexia: Secondary | ICD-10-CM | POA: Diagnosis not present

## 2020-07-29 DIAGNOSIS — L258 Unspecified contact dermatitis due to other agents: Secondary | ICD-10-CM | POA: Diagnosis not present

## 2020-07-30 DIAGNOSIS — Z0000 Encounter for general adult medical examination without abnormal findings: Secondary | ICD-10-CM | POA: Diagnosis not present

## 2020-07-30 DIAGNOSIS — R630 Anorexia: Secondary | ICD-10-CM | POA: Diagnosis not present

## 2020-07-30 DIAGNOSIS — R627 Adult failure to thrive: Secondary | ICD-10-CM | POA: Diagnosis not present

## 2020-07-30 DIAGNOSIS — R1030 Lower abdominal pain, unspecified: Secondary | ICD-10-CM | POA: Diagnosis not present

## 2020-07-30 DIAGNOSIS — L258 Unspecified contact dermatitis due to other agents: Secondary | ICD-10-CM | POA: Diagnosis not present

## 2020-08-03 DIAGNOSIS — L258 Unspecified contact dermatitis due to other agents: Secondary | ICD-10-CM | POA: Diagnosis not present

## 2020-08-03 DIAGNOSIS — J9610 Chronic respiratory failure, unspecified whether with hypoxia or hypercapnia: Secondary | ICD-10-CM | POA: Diagnosis not present

## 2020-08-03 DIAGNOSIS — R1030 Lower abdominal pain, unspecified: Secondary | ICD-10-CM | POA: Diagnosis not present

## 2020-08-03 DIAGNOSIS — E46 Unspecified protein-calorie malnutrition: Secondary | ICD-10-CM | POA: Diagnosis not present

## 2020-08-03 DIAGNOSIS — R630 Anorexia: Secondary | ICD-10-CM | POA: Diagnosis not present

## 2020-08-03 DIAGNOSIS — R109 Unspecified abdominal pain: Secondary | ICD-10-CM | POA: Diagnosis not present

## 2020-08-03 DIAGNOSIS — J449 Chronic obstructive pulmonary disease, unspecified: Secondary | ICD-10-CM | POA: Diagnosis not present

## 2020-08-04 DIAGNOSIS — J432 Centrilobular emphysema: Secondary | ICD-10-CM | POA: Diagnosis not present

## 2020-08-04 DIAGNOSIS — J449 Chronic obstructive pulmonary disease, unspecified: Secondary | ICD-10-CM | POA: Diagnosis not present

## 2020-08-05 DIAGNOSIS — R109 Unspecified abdominal pain: Secondary | ICD-10-CM | POA: Diagnosis not present

## 2020-08-12 DIAGNOSIS — L258 Unspecified contact dermatitis due to other agents: Secondary | ICD-10-CM | POA: Diagnosis not present

## 2020-08-12 DIAGNOSIS — E46 Unspecified protein-calorie malnutrition: Secondary | ICD-10-CM | POA: Diagnosis not present

## 2020-08-12 DIAGNOSIS — R05 Cough: Secondary | ICD-10-CM | POA: Diagnosis not present

## 2020-08-12 DIAGNOSIS — J449 Chronic obstructive pulmonary disease, unspecified: Secondary | ICD-10-CM | POA: Diagnosis not present

## 2020-08-12 DIAGNOSIS — K567 Ileus, unspecified: Secondary | ICD-10-CM | POA: Diagnosis not present

## 2020-08-12 DIAGNOSIS — J9610 Chronic respiratory failure, unspecified whether with hypoxia or hypercapnia: Secondary | ICD-10-CM | POA: Diagnosis not present

## 2020-08-16 DIAGNOSIS — R41841 Cognitive communication deficit: Secondary | ICD-10-CM | POA: Diagnosis not present

## 2020-08-18 DIAGNOSIS — R41841 Cognitive communication deficit: Secondary | ICD-10-CM | POA: Diagnosis not present

## 2020-08-19 DIAGNOSIS — R41841 Cognitive communication deficit: Secondary | ICD-10-CM | POA: Diagnosis not present

## 2020-08-20 DIAGNOSIS — R41841 Cognitive communication deficit: Secondary | ICD-10-CM | POA: Diagnosis not present

## 2020-08-21 DIAGNOSIS — R41841 Cognitive communication deficit: Secondary | ICD-10-CM | POA: Diagnosis not present

## 2020-08-22 DIAGNOSIS — R197 Diarrhea, unspecified: Secondary | ICD-10-CM | POA: Diagnosis not present

## 2020-08-24 DIAGNOSIS — R41841 Cognitive communication deficit: Secondary | ICD-10-CM | POA: Diagnosis not present

## 2020-08-25 DIAGNOSIS — R41841 Cognitive communication deficit: Secondary | ICD-10-CM | POA: Diagnosis not present

## 2020-08-26 DIAGNOSIS — R630 Anorexia: Secondary | ICD-10-CM | POA: Diagnosis not present

## 2020-08-26 DIAGNOSIS — L258 Unspecified contact dermatitis due to other agents: Secondary | ICD-10-CM | POA: Diagnosis not present

## 2020-08-26 DIAGNOSIS — E46 Unspecified protein-calorie malnutrition: Secondary | ICD-10-CM | POA: Diagnosis not present

## 2020-08-26 DIAGNOSIS — R109 Unspecified abdominal pain: Secondary | ICD-10-CM | POA: Diagnosis not present

## 2020-08-26 DIAGNOSIS — R41841 Cognitive communication deficit: Secondary | ICD-10-CM | POA: Diagnosis not present

## 2020-08-26 DIAGNOSIS — J449 Chronic obstructive pulmonary disease, unspecified: Secondary | ICD-10-CM | POA: Diagnosis not present

## 2020-08-27 DIAGNOSIS — R41841 Cognitive communication deficit: Secondary | ICD-10-CM | POA: Diagnosis not present

## 2020-08-31 DIAGNOSIS — L258 Unspecified contact dermatitis due to other agents: Secondary | ICD-10-CM | POA: Diagnosis not present

## 2020-08-31 DIAGNOSIS — J449 Chronic obstructive pulmonary disease, unspecified: Secondary | ICD-10-CM | POA: Diagnosis not present

## 2020-08-31 DIAGNOSIS — R630 Anorexia: Secondary | ICD-10-CM | POA: Diagnosis not present

## 2020-08-31 DIAGNOSIS — E46 Unspecified protein-calorie malnutrition: Secondary | ICD-10-CM | POA: Diagnosis not present

## 2020-08-31 DIAGNOSIS — K567 Ileus, unspecified: Secondary | ICD-10-CM | POA: Diagnosis not present

## 2020-09-01 DIAGNOSIS — J449 Chronic obstructive pulmonary disease, unspecified: Secondary | ICD-10-CM | POA: Diagnosis not present

## 2020-09-01 DIAGNOSIS — J432 Centrilobular emphysema: Secondary | ICD-10-CM | POA: Diagnosis not present

## 2020-09-04 DIAGNOSIS — H43812 Vitreous degeneration, left eye: Secondary | ICD-10-CM | POA: Diagnosis not present

## 2020-09-04 DIAGNOSIS — H1045 Other chronic allergic conjunctivitis: Secondary | ICD-10-CM | POA: Diagnosis not present

## 2020-09-04 DIAGNOSIS — H2511 Age-related nuclear cataract, right eye: Secondary | ICD-10-CM | POA: Diagnosis not present

## 2020-09-04 DIAGNOSIS — Z961 Presence of intraocular lens: Secondary | ICD-10-CM | POA: Diagnosis not present

## 2020-09-16 DIAGNOSIS — J449 Chronic obstructive pulmonary disease, unspecified: Secondary | ICD-10-CM | POA: Diagnosis not present

## 2020-09-16 DIAGNOSIS — J9610 Chronic respiratory failure, unspecified whether with hypoxia or hypercapnia: Secondary | ICD-10-CM | POA: Diagnosis not present

## 2020-09-16 DIAGNOSIS — E46 Unspecified protein-calorie malnutrition: Secondary | ICD-10-CM | POA: Diagnosis not present

## 2020-09-16 DIAGNOSIS — L258 Unspecified contact dermatitis due to other agents: Secondary | ICD-10-CM | POA: Diagnosis not present

## 2020-09-24 DIAGNOSIS — L258 Unspecified contact dermatitis due to other agents: Secondary | ICD-10-CM | POA: Diagnosis not present

## 2020-09-24 DIAGNOSIS — E46 Unspecified protein-calorie malnutrition: Secondary | ICD-10-CM | POA: Diagnosis not present

## 2020-09-24 DIAGNOSIS — J449 Chronic obstructive pulmonary disease, unspecified: Secondary | ICD-10-CM | POA: Diagnosis not present

## 2020-09-24 DIAGNOSIS — I119 Hypertensive heart disease without heart failure: Secondary | ICD-10-CM | POA: Diagnosis not present

## 2020-10-02 DIAGNOSIS — J449 Chronic obstructive pulmonary disease, unspecified: Secondary | ICD-10-CM | POA: Diagnosis not present

## 2020-10-02 DIAGNOSIS — J432 Centrilobular emphysema: Secondary | ICD-10-CM | POA: Diagnosis not present

## 2020-10-14 DIAGNOSIS — J9610 Chronic respiratory failure, unspecified whether with hypoxia or hypercapnia: Secondary | ICD-10-CM | POA: Diagnosis not present

## 2020-10-14 DIAGNOSIS — J449 Chronic obstructive pulmonary disease, unspecified: Secondary | ICD-10-CM | POA: Diagnosis not present

## 2020-10-14 DIAGNOSIS — L258 Unspecified contact dermatitis due to other agents: Secondary | ICD-10-CM | POA: Diagnosis not present

## 2020-10-14 DIAGNOSIS — J432 Centrilobular emphysema: Secondary | ICD-10-CM | POA: Diagnosis not present

## 2020-10-19 ENCOUNTER — Ambulatory Visit: Payer: TRICARE For Life (TFL) | Admitting: Dermatology

## 2020-11-02 DIAGNOSIS — I10 Essential (primary) hypertension: Secondary | ICD-10-CM | POA: Diagnosis not present

## 2020-11-02 DIAGNOSIS — G4700 Insomnia, unspecified: Secondary | ICD-10-CM | POA: Diagnosis not present

## 2020-11-02 DIAGNOSIS — K5909 Other constipation: Secondary | ICD-10-CM | POA: Diagnosis not present

## 2020-11-02 DIAGNOSIS — E782 Mixed hyperlipidemia: Secondary | ICD-10-CM | POA: Diagnosis not present

## 2020-11-02 DIAGNOSIS — F329 Major depressive disorder, single episode, unspecified: Secondary | ICD-10-CM | POA: Diagnosis not present

## 2020-11-02 DIAGNOSIS — J438 Other emphysema: Secondary | ICD-10-CM | POA: Diagnosis not present

## 2020-11-02 DIAGNOSIS — Z8616 Personal history of COVID-19: Secondary | ICD-10-CM | POA: Diagnosis not present

## 2021-02-02 DEATH — deceased
